# Patient Record
Sex: Male | Born: 1935 | Race: White | Hispanic: No | Marital: Married | Smoking: Former smoker
Health system: Southern US, Community
[De-identification: ages and names within clinical notes are randomized; demographics above are authoritative.]

## PROBLEM LIST (undated history)

## (undated) DIAGNOSIS — E7800 Pure hypercholesterolemia, unspecified: Secondary | ICD-10-CM

## (undated) DIAGNOSIS — Z9049 Acquired absence of other specified parts of digestive tract: Secondary | ICD-10-CM

## (undated) DIAGNOSIS — D649 Anemia, unspecified: Secondary | ICD-10-CM

## (undated) DIAGNOSIS — K5730 Diverticulosis of large intestine without perforation or abscess without bleeding: Secondary | ICD-10-CM

## (undated) DIAGNOSIS — I2510 Atherosclerotic heart disease of native coronary artery without angina pectoris: Secondary | ICD-10-CM

## (undated) DIAGNOSIS — K4090 Unilateral inguinal hernia, without obstruction or gangrene, not specified as recurrent: Secondary | ICD-10-CM

## (undated) DIAGNOSIS — K219 Gastro-esophageal reflux disease without esophagitis: Secondary | ICD-10-CM

## (undated) DIAGNOSIS — D126 Benign neoplasm of colon, unspecified: Secondary | ICD-10-CM

## (undated) DIAGNOSIS — I10 Essential (primary) hypertension: Secondary | ICD-10-CM

## (undated) DIAGNOSIS — C801 Malignant (primary) neoplasm, unspecified: Secondary | ICD-10-CM

## (undated) DIAGNOSIS — F419 Anxiety disorder, unspecified: Secondary | ICD-10-CM

## (undated) DIAGNOSIS — M810 Age-related osteoporosis without current pathological fracture: Secondary | ICD-10-CM

## (undated) DIAGNOSIS — S1121XA Laceration without foreign body of pharynx and cervical esophagus, initial encounter: Secondary | ICD-10-CM

## (undated) HISTORY — PX: CARDIAC SURGERY: SHX584

## (undated) HISTORY — DX: Acquired absence of other specified parts of digestive tract: Z90.49

## (undated) HISTORY — PX: APPENDECTOMY: SHX54

## (undated) HISTORY — DX: Laceration without foreign body of pharynx and cervical esophagus, initial encounter: S11.21XA

## (undated) HISTORY — PX: ESOPHAGUS SURGERY: SHX626

## (undated) HISTORY — DX: Age-related osteoporosis without current pathological fracture: M81.0

## (undated) HISTORY — DX: Benign neoplasm of colon, unspecified: D12.6

## (undated) HISTORY — DX: Atherosclerotic heart disease of native coronary artery without angina pectoris: I25.10

## (undated) HISTORY — DX: Anemia, unspecified: D64.9

## (undated) HISTORY — DX: Diverticulosis of large intestine without perforation or abscess without bleeding: K57.30

## (undated) HISTORY — PX: HERNIA REPAIR: SHX51

## (undated) HISTORY — DX: Anxiety disorder, unspecified: F41.9

## (undated) HISTORY — PX: CORONARY ARTERY BYPASS GRAFT: SHX141

## (undated) HISTORY — PX: SMALL INTESTINE SURGERY: SHX150

## (undated) HISTORY — PX: KYPHOSIS SURGERY: SHX114

---

## 2005-10-29 DIAGNOSIS — I2510 Atherosclerotic heart disease of native coronary artery without angina pectoris: Secondary | ICD-10-CM

## 2005-10-29 HISTORY — DX: Atherosclerotic heart disease of native coronary artery without angina pectoris: I25.10

## 2010-11-21 ENCOUNTER — Inpatient Hospital Stay (HOSPITAL_COMMUNITY)
Admission: EM | Admit: 2010-11-21 | Discharge: 2010-11-23 | Payer: Self-pay | Source: Home / Self Care | Attending: Internal Medicine | Admitting: Internal Medicine

## 2010-11-22 LAB — HEMOGLOBIN AND HEMATOCRIT, BLOOD
HCT: 38.2 % — ABNORMAL LOW (ref 39.0–52.0)
HCT: 37.7 % — ABNORMAL LOW (ref 39.0–52.0)
HCT: 36.9 % — ABNORMAL LOW (ref 39.0–52.0)
Hemoglobin: 13 g/dL (ref 13.0–17.0)
Hemoglobin: 12.7 g/dL — ABNORMAL LOW (ref 13.0–17.0)

## 2010-11-22 LAB — CBC
Hemoglobin: 13.5 g/dL (ref 13.0–17.0)
Hemoglobin: 12.6 g/dL — ABNORMAL LOW (ref 13.0–17.0)
MCH: 32.2 pg (ref 26.0–34.0)
MCHC: 35.4 g/dL (ref 30.0–36.0)
MCV: 94.6 fL (ref 78.0–100.0)
Platelets: 172 10*3/uL (ref 150–400)
Platelets: 168 10*3/uL (ref 150–400)
RBC: 3.91 MIL/uL — ABNORMAL LOW (ref 4.22–5.81)
RDW: 13.4 % (ref 11.5–15.5)
WBC: 10.1 10*3/uL (ref 4.0–10.5)

## 2010-11-22 LAB — CARDIAC PANEL(CRET KIN+CKTOT+MB+TROPI)
CK, MB: 2.4 ng/mL (ref 0.3–4.0)
Relative Index: 0.8 (ref 0.0–2.5)
Troponin I: 0.04 ng/mL (ref 0.00–0.06)

## 2010-11-22 LAB — COMPREHENSIVE METABOLIC PANEL
ALT: 15 U/L (ref 0–53)
AST: 22 U/L (ref 0–37)
Albumin: 3 g/dL — ABNORMAL LOW (ref 3.5–5.2)
CO2: 21 mEq/L (ref 19–32)
Calcium: 8.9 mg/dL (ref 8.4–10.5)
Creatinine, Ser: 1.02 mg/dL (ref 0.4–1.5)
GFR calc Af Amer: >60 mL/min (ref >60)
Sodium: 136 mEq/L (ref 135–145)
Total Protein: 8.8 g/dL — ABNORMAL HIGH (ref 6.0–8.3)

## 2010-11-22 LAB — BASIC METABOLIC PANEL
BUN: 36 mg/dL — ABNORMAL HIGH (ref 6–23)
Chloride: 104 mEq/L (ref 96–112)
GFR calc non Af Amer: >60 mL/min (ref >60)
Glucose, Bld: 96 mg/dL (ref 70–99)
Potassium: 3.5 mEq/L (ref 3.5–5.1)
Sodium: 137 mEq/L (ref 135–145)

## 2010-11-22 LAB — HEPATIC FUNCTION PANEL
ALT: 14 U/L (ref 0–53)
Bilirubin, Direct: 0.3 mg/dL (ref 0.0–0.3)
Indirect Bilirubin: 0.8 mg/dL (ref 0.3–0.9)
Total Protein: 8.1 g/dL (ref 6.0–8.3)

## 2010-11-22 LAB — BRAIN NATRIURETIC PEPTIDE: Pro B Natriuretic peptide (BNP): 67 pg/mL (ref 0.0–100.0)

## 2010-11-22 LAB — OCCULT BLOOD, POC DEVICE: Fecal Occult Bld: POSITIVE

## 2010-11-22 LAB — CK TOTAL AND CKMB (NOT AT ARMC)
CK, MB: 1.9 ng/mL (ref 0.3–4.0)
Relative Index: 1.1 (ref 0.0–2.5)

## 2010-11-22 LAB — TROPONIN I: Troponin I: 0.03 ng/mL (ref 0.00–0.06)

## 2010-11-23 LAB — HEMOGLOBIN AND HEMATOCRIT, BLOOD: Hemoglobin: 12.9 g/dL — ABNORMAL LOW (ref 13.0–17.0)

## 2010-11-23 NOTE — H&P (Addendum)
NAMEGARRETT, Andre Mueller                ACCOUNT NO.:  [id number]  MEDICAL RECORD NO.:  [id number]          PATIENT TYPE:  INP  LOCATION:  4742                         FACILITY:  [facility]  PHYSICIAN:  Vonna Brabson I Charlottie Peragine, MD      DATE OF BIRTH:  January 18, 1921  DATE OF ADMISSION:  11/21/2010 DATE OF DISCHARGE:                             HISTORY & PHYSICAL   PRIMARY CARE PHYSICIAN:  Deirdre Peer. Polite, MD  CHIEF COMPLAINT:  Vomiting associated with dark brown and coughing for 1 day.  HISTORY OF PRESENT ILLNESS:  This is an 77 year old gentleman who moved from [place] to [place] area for the last 14 months.  The patient has significant medical history of coronary artery disease, status post 2 CABG, 2 bypass, according to the patient he has a history of multiple esophageal stricture, status post dilatation and as my understanding, the patient has a history of esophageal perforation, status post surgical repair and this was 5 years ago.  When the patient had prep for colonoscopy and he could not tolerate the prep secondary to vomiting and then the patient was evaluated and found to have perforation on the esophagus and accordingly, he had a surgery through his back.  The patient was seen by Dr. Abner Greenspan on medical checkup last week and at that time, the patient has no major complaint.  The patient denies any drinking alcohol.  He quit more than 5 years ago and he denies any history of peptic ulcer disease.  He denies using any nonsteroid anti-inflammatory drugs.  He is on high-dose of aspirin 325 mg.  His symptoms started with vomiting, food and then he noticed some dark brown vomitus.  He is feeling weak at this time.  When EMS was called, EMS documented the patient has coffee-ground emesis in the waste can and on bed.  The patient was seen here at [facility] Emergency Room and have guaiac stool which was positive.  He had stable vital signs. The patient denies any chest pain, denies any  shortness of breath.  He continued to complain of nausea and according to the patient, vomiting is stopped early morning today.  The patient denies any abdominal pain or epigastric.  The patient denies any syncopal or near syncopal event.  PAST MEDICAL HISTORY: 1. Significant for history of gastroesophageal reflux disease. 2. History of esophageal stricture, status post dilatation. 3. Hypertension. 4. History of myocardial infarction. 5. Hypercholesteremia.  PAST SURGICAL HISTORY: 1. History of CABG. 2. History of esophageal perforation, status post surgery and repair. 3. History of abdominal surgery.  ALLERGIES:  PENICILLIN.  SOCIAL HISTORY:  The patient lives with his wife on independent living facility and his family is nearby.  The patient denies any smoking.  He has quit more than 4 years ago and he used to drink, but quit more than 5 years ago after diagnosis of heart attack made.  MEDICATIONS:  According to the EMS note, now the patient on: 1. Ambien. 2. Ativan. 3. Lasix. 4. Phenergan 25 mg as needed. 5. Prilosec. 6. __________ 7. Zocor. 8. Multivitamin. 9. Amlodipine and benazepril. 10.Nitroglycerin.  SYSTEMIC REVIEW:  HEENT:  The patient denies any headache.  Denies any blurring of vision.  Denies any seizure activity.  CARDIOVASCULAR:  The patient denies any chest pain.  Denies any shortness of breath.  Denies any lower extremity swelling.  Denies any palpitation.  Denies any syncopal or presyncopal event.  RESPIRATORY:  The patient complaining of coughing of clear phlegm.  ABDOMEN:  As we mentioned, complained of persistent vomiting.  Denies any abdominal pain.  Denies any dark stool. He has regular bowel movement.  UROLOGY:  Denies any burning micturition.  Denies any numbness or weakness of his extremity.  PHYSICAL EXAMINATION:  VITAL SIGNS:  Temperature 99, blood pressure 107/61, pulse rate 86, respiratory rate 20, and O2 sats 91% on room air. HEENT:   Normocephalic and atraumatic.  Pupils equal, reactive to light and accommodation.  Extraocular muscle movement within normal. NECK:  Supple.  No lymphadenopathy. HEART:  Sound S1 and S2 with no added sounds. LUNGS:  Bilateral rales.  No wheezing. ABDOMEN:  Soft and nontender.  Bowel sounds positive.  Peripheral pulses intact.  There is no lower limb edema.  No rebound.  No guarding. CNS:  The patient is awake, alert, and oriented x3 with no focal neurological finding.  LABORATORY DATA:  Blood work did show sodium 136, potassium 3.4, chloride 104, CO2 21, glucose 166, BUN 38, and creatinine 1.02, total bilirubin 1.3, alk phos 44, total protein 8.8, albumin 3, and calcium 8.9.  CBC; white blood cells 9.3, hemoglobin 13.5, hematocrit 38.1, and platelets 172.  Chest x-ray did show postmedian sternotomy prominent with marking chronic, recommend x-ray AP and lateral view.  ASSESSMENT: 1. Persistent nausea and vomiting. 2. Gastrointestinal bleeding. 3. Hypokalemia. 4. History of esophageal perforation. 5. History of esophageal stricture. 6. History of coronary artery disease.  PLAN:  The patient will be admitted to telemetry.  His vital signs seems stable currently, we will proceed with CT chest for evaluation of any evidence of perforation.  We will get lactic acid and procalcitonin level.  We will start the patient on Protonix IV 40 mg twice daily.  We will ask GI to see the patient.  The patient admitted, he has been coughing also.  White blood cell seem stable at 9.3.  We will provide the patient with Avelox 400 mg p.o. daily for possibility of aspiration.  The patient has obvious bilateral rales which could be the patient baseline, but also putting on consideration history of CABG, we will proceed with checking the patient's BNP and gentle IV hydration.  We will keep the patient n.p.o.  Further recommendation to be addressed as hospital course progress.     Jordie Schreur Bosie Helper,  MD     HIE/MEDQ  D:  11/21/2010  T:  11/22/2010  Job:  [id number]  Electronically Signed by Ebony Cargo MD on 11/23/2010 02:00:34 PM

## 2010-11-30 NOTE — Discharge Summary (Signed)
  NAMECYPRUS, Andre Mueller                ACCOUNT NO.:  [id number]  MEDICAL RECORD NO.:  [id number]          PATIENT TYPE:  INP  LOCATION:  4742                         FACILITY:  [facility]  PHYSICIAN:  Deirdre Peer. Tyrease Vandeberg, M.D. DATE OF BIRTH:  Nov 30, 1920  DATE OF ADMISSION:  11/21/2010 DATE OF DISCHARGE:  11/23/2010                              DISCHARGE SUMMARY   DISCHARGE DIAGNOSES: 1. Nausea and vomiting with reported coffee-ground emesis and heme-     positive stools, the patient underwent EGD which showed some     esophagitis, no ulcer, H and H remained stable, the patient was     tolerant of full p.o. intake without recurrence of emesis.  He was     discharged to home in stable condition.  Asked to continue PPI     b.i.d. for 2 weeks, then daily.  We will hold his aspirin until     outpatient followup. 2. Hypertension. 3. Coronary artery disease. 4. High cholesterol, 5. Gastroesophageal reflux disease.  DISCHARGE MEDICATIONS: 1. Lasix 20 mg daily. 2. Ambien 10 mg nightly. 3. Ativan 1 mg b.i.d. p.r.n. 4. Lotrel 5/40 daily. 5. Multivitamin daily. 6. Sublingual nitroglycerin p.r.n. 7. Benicar 25 mg p.r.n. 8. Ziac daily. 9. Zocor 10 mg daily. 10.The patient's Prilosec will be 40 mg b.i.d.  DISPOSITION:  Discharged to home in stable condition, please note the patient was seen by PT, Home Health Services will be provided. Hemoglobin at discharge 12.9.  HISTORY OF PRESENT ILLNESS:  Andre Mueller presented to the ED with complaints of nausea and vomiting.  In the ED the patient was evaluated. Admission was deemed necessary for full evaluation and treatment. Please see dictated H and P for further details.  HOSPITAL COURSE:  The patient was admitted to the medicine floor bed for evaluation and treatment of nausea and vomiting.  The patient's chest x- ray suggested right lower lobe mass and mediastinal adenopathy and recommended CT.  CT was obtained which revealed mild-to-moderate  diffuse low density esophageal wall thickening compatible with esophagitis, no other mass was identified.  There was air in the biliary tree.  There also was right diaphragmatic pleural calcifications possibly due to previous asbestosis exposure.  The patient was hospitalized, was made n.p.o., GI was consulted.  The patient underwent EGD with findings as discussed above, only revealing esophagitis, no ulcer.  The patient was resumed on PPI b.i.d. and diet was advanced which he tolerated well.  The patient was somewhat fatigued.  Physical therapy was obtained.  Home health services were provided.  The patient was discharged to home in stable condition and asked to follow with primary MD in 1-2 weeks.     Deirdre Peer. Lakie Mclouth, M.D.     RDP/MEDQ  D:  11/23/2010  T:  11/24/2010  Job:  [id number]  Electronically Signed by Windy Fast Aleda Madl M.D. on 11/30/2010 10:03:09 AM

## 2010-12-27 NOTE — Consult Note (Signed)
[name], [name]                ACCOUNT NO.:  [id number]  MEDICAL RECORD NO.:  [id number]          PATIENT TYPE:  INP  LOCATION:  4742                         FACILITY:  [facility]  PHYSICIAN:  Willis Modena, MD     DATE OF BIRTH:  June 30, 1921  DATE OF CONSULTATION:  11/21/2010 DATE OF DISCHARGE:                                CONSULTATION   REASON FOR CONSULTATION:  Coffee-ground emesis.  CHIEF COMPLAINT:  Nausea, vomiting.  HISTORY OF PRESENT ILLNESS:  [name] is an 77 year old gentleman with history of coronary artery disease as well as history of GERD with spontaneous esophageal rupture about 20 years ago.  He was in a static state of health until in the wee hours of this morning when he started having nausea and vomiting.  The vomiting was initially bile colored but eventually obtained a dark brown coffee-ground texture.  He has a history, as mentioned, of esophageal perforation with ___________ repair many years ago.  He has had some postoperative dysphagia.  This required periodic endoscopic dilatations.  He has not had an endoscopy by his report in at least 5 or 6 years.  He has chronic acid reflux as mentioned and does take acid suppressing therapy.  He has a history of GI bleeding in the past required removal of part of his small intestines about 20 years ago.  No other known GI history.  PAST MEDICAL HISTORY:  Hypertension, hyperlipidemia, coronary artery disease with MI, glaucoma, cataracts, possible history of Waldenstrom macroglobulinemia, skin cancer, benign prostatic hypertrophy, allergic rhinitis, insomnia.  ALLERGIES:  PENICILLIN.  MEDICATIONS: 1. Aspirin. 2. Phenergan. 3. Nitroglycerin as needed. 4. Lasix. 5. Omeprazole 20 mg a day. 6. Flonase. 7. Lotrel. 8. Simvastatin. 9. Bisoprolol/hydrochlorothiazide. 10.Ativan. 11.Ambien.  SOCIAL HISTORY:  Lives with his wife at [facility].  Denies smoking, alcohol or illicit  drug use.  REVIEW OF SYSTEMS:  As per history of present illness.  All other systems were negative.  PHYSICAL EXAMINATION:  VITAL SIGNS:  Systolic blood pressure is by 161, heart rates about 80, is afebrile. GENERAL:  He is nontoxic appearing. HEENT:  Normocephalic, atraumatic.  Dry mucous membranes.  Eyes; sclerae anicteric.  Conjunctivae are pink.  NECK:  Supple without thyromegaly, lymphadenopathy or bruits. HEART:  Regular rhythm, normal rate without murmur, rub or gallop. LUNGS:  Clear to auscultation. ABDOMEN:  Multiple old surgical scars.  Soft, nontender, nondistended with normoactive bowel sounds.  EXTREMITIES:  No peripheral cyanosis, clubbing or edema. SKIN:  Skin has some occasional ecchymoses.  No obvious rash. LYMPHATICS:  No palpable axillary, submandibular, supraclavicular adenopathy. NEUROLOGIC:  Diffusely weak, nonfocal without lateralizing signs.  He does appear to have some short and long-term memory problems but is redirectable.  LABORATORY DATA:  White count 9.3, hemoglobin 13.5, platelet count is 172.  Sodium 136, potassium 3.4, chloride 101, bicarb 21, BUN 38, creatinine 1.0, total protein 8.8, albumin 3.0, total bilirubin is 1.3, alk phos 44, AST is 22, ALT 15.  Cardiac enzymes are negative, had a CT scan of the chest today that shows some distal esophageal thickening with esophagitis, some pneumobilia, but  no evidence of esophageal perforation.  IMPRESSION AND PLAN:  [name] is an 77 year old gentleman who we have been asked to see for coffee-ground emesis.  I suspect he has esophagitis.  Less likely consideration would be a Mallory-Weiss tear in light of his repeated bilious episodes of vomiting followed by some coffee grounds.  He is not actively bleeding and is hemodynamically stable and is not toxic-appearing. 1. I agree with supportive management with IV fluids and proton pump     inhibitor therapy. 2. We will proceed with endoscopy tomorrow for  further evaluation.     Willis Modena, MD     WO/MEDQ  D:  11/21/2010  T:  11/22/2010  Job:  [id number]  Electronically Signed by Willis Modena  on 12/27/2010 05:39:38 PM

## 2012-02-14 ENCOUNTER — Emergency Department (HOSPITAL_COMMUNITY): Payer: Medicare Other

## 2012-02-14 ENCOUNTER — Encounter (HOSPITAL_COMMUNITY): Payer: Self-pay | Admitting: *Deleted

## 2012-02-14 ENCOUNTER — Emergency Department (HOSPITAL_COMMUNITY)
Admission: EM | Admit: 2012-02-14 | Discharge: 2012-02-14 | Disposition: A | Discharge status: Home / Self Care | Payer: Medicare Other | Attending: Emergency Medicine | Admitting: Emergency Medicine

## 2012-02-14 DIAGNOSIS — I10 Essential (primary) hypertension: Secondary | ICD-10-CM | POA: Insufficient documentation

## 2012-02-14 DIAGNOSIS — R109 Unspecified abdominal pain: Secondary | ICD-10-CM | POA: Insufficient documentation

## 2012-02-14 DIAGNOSIS — K5900 Constipation, unspecified: Secondary | ICD-10-CM | POA: Insufficient documentation

## 2012-02-14 DIAGNOSIS — I252 Old myocardial infarction: Secondary | ICD-10-CM | POA: Insufficient documentation

## 2012-02-14 HISTORY — DX: Essential (primary) hypertension: I10

## 2012-02-14 HISTORY — DX: Gastro-esophageal reflux disease without esophagitis: K21.9

## 2012-02-14 LAB — DIFFERENTIAL
Basophils Absolute: 0 10*3/uL (ref 0.0–0.1)
Lymphocytes Relative: 19 % (ref 12–46)
Lymphs Abs: 2 10*3/uL (ref 0.7–4.0)
Neutro Abs: 7.4 10*3/uL (ref 1.7–7.7)
Neutrophils Relative %: 69 % (ref 43–77)

## 2012-02-14 LAB — URINALYSIS, ROUTINE W REFLEX MICROSCOPIC
Bilirubin Urine: NEGATIVE
Ketones, ur: 15 mg/dL — AB
Specific Gravity, Urine: 1.011 (ref 1.005–1.030)
Urobilinogen, UA: 0.2 mg/dL (ref 0.0–1.0)

## 2012-02-14 LAB — COMPREHENSIVE METABOLIC PANEL
ALT: 11 U/L (ref 0–53)
AST: 20 U/L (ref 0–37)
Alkaline Phosphatase: 66 U/L (ref 39–117)
CO2: 23 mEq/L (ref 19–32)
Calcium: 9.7 mg/dL (ref 8.4–10.5)
Chloride: 99 mEq/L (ref 96–112)
GFR calc Af Amer: >90 mL/min (ref >90)
GFR calc non Af Amer: 79 mL/min — ABNORMAL LOW (ref >90)
Glucose, Bld: 100 mg/dL — ABNORMAL HIGH (ref 70–99)
Potassium: 3.9 mEq/L (ref 3.5–5.1)
Sodium: 136 mEq/L (ref 135–145)
Total Bilirubin: 1 mg/dL (ref 0.3–1.2)

## 2012-02-14 LAB — CBC
MCV: 95.4 fL (ref 78.0–100.0)
Platelets: 179 10*3/uL (ref 150–400)
RBC: 4.15 MIL/uL — ABNORMAL LOW (ref 4.22–5.81)
RDW: 13.9 % (ref 11.5–15.5)
WBC: 10.7 10*3/uL — ABNORMAL HIGH (ref 4.0–10.5)

## 2012-02-14 LAB — URINE MICROSCOPIC-ADD ON

## 2012-02-14 MED ORDER — MAGNESIUM CITRATE PO SOLN
1.0000 | Freq: Once | ORAL | Status: AC
  Administered 2012-02-14: 1 via ORAL
  Filled 2012-02-14: qty 296

## 2012-02-14 MED ORDER — POLYETHYLENE GLYCOL 3350 17 GM/SCOOP PO POWD: 17.0000 g | Freq: Every day | ORAL | Status: AC

## 2012-02-14 MED ORDER — MINERAL OIL RE ENEM
1.0000 | ENEMA | RECTAL | Status: AC
  Administered 2012-02-14: 1 via RECTAL
  Filled 2012-02-14: qty 1

## 2012-02-14 MED ORDER — BISACODYL 10 MG RE SUPP
10.0000 mg | Freq: Once | RECTAL | Status: AC
  Administered 2012-02-14: 10 mg via RECTAL
  Filled 2012-02-14: qty 1

## 2012-02-14 NOTE — ED Notes (Signed)
Pt in from [place] by ems. Pt reports abd pain x2 days. Radiating to bil flanks. Constipation x2 days. Denies n/v.

## 2012-02-14 NOTE — ED Provider Notes (Addendum)
History     CSN: [id number]  Arrival date & time 02/14/12  1306   First MD Initiated Contact with Patient 02/14/12 1457      Chief Complaint  Patient presents with  . Abdominal Pain  . Flank Pain    (Consider location/radiation/quality/duration/timing/severity/associated sxs/prior treatment) HPI Comments: Patient presents with complaints of constipation and back pain.  He states that he has not had a normal bowel movement in 2-3 days.  He notes decrease flatus as well.  No nausea or vomiting or fevers.  Patient notes that his abdomen feels uncomfortable but does not specifically have pain.  Patient also notes 2-3 days of bilateral flank pain.  No dysuria or hematuria.  No history kidney stones.  No lightheadedness or dizziness.  No chest pain or shortness of breath.  For the patient's constipation he had tried over-the-counter laxative and a fleets enema without relief of symptoms.  Patient presents because the back of the fleets package mentioned that if he did not have results you should seek further medical care.  Patient is a 78 y.o. male presenting with abdominal pain and flank pain. The history is provided by the patient. No language interpreter was used.  Abdominal Pain The primary symptoms of the illness include abdominal pain. The primary symptoms of the illness do not include fever, fatigue, shortness of breath, nausea, vomiting, diarrhea, hematemesis, hematochezia or dysuria. The current episode started more than 2 days ago. The onset of the illness was gradual. The problem has been gradually worsening.  Additional symptoms associated with the illness include constipation. Symptoms associated with the illness do not include chills, hematuria or back pain.  Flank Pain Associated symptoms include abdominal pain. Pertinent negatives include no chest pain, no headaches and no shortness of breath.    Past Medical History  Diagnosis Date  . MI (myocardial infarction)   .  Hypertension   . GERD (gastroesophageal reflux disease)   . Hernia     Past Surgical History  Procedure Date  . Cardiac surgery   . Abdominal surgery   . Appendectomy     No family history on file.  History  Substance Use Topics  . Smoking status: Never Smoker   . Smokeless tobacco: Not on file  . Alcohol Use: No      Review of Systems  Constitutional: Negative.  Negative for fever, chills and fatigue.  HENT: Negative.   Eyes: Negative.  Negative for discharge and redness.  Respiratory: Negative.  Negative for cough and shortness of breath.   Cardiovascular: Negative.  Negative for chest pain.  Gastrointestinal: Positive for abdominal pain and constipation. Negative for nausea, vomiting, diarrhea, hematochezia and hematemesis.  Genitourinary: Positive for flank pain. Negative for dysuria and hematuria.  Musculoskeletal: Negative.  Negative for back pain.  Skin: Negative.  Negative for color change and rash.  Neurological: Negative for syncope and headaches.  Hematological: Negative.  Negative for adenopathy.  Psychiatric/Behavioral: Negative.  Negative for confusion.  All other systems reviewed and are negative.    Allergies  Penicillins  Home Medications   Current Outpatient Rx  Name Route Sig Dispense Refill  . LOTREL PO Oral Take 1 capsule by mouth daily. 40 mg    . AMMONIUM LACTATE 12 % EX CREA Topical Apply 1 Bottle topically as needed. Apply to cheek    . ASPIRIN EC 81 MG PO TBEC Oral Take 81 mg by mouth daily.    Marland Kitchen BISOPROLOL FUMARATE PO Oral Take 1 tablet by mouth daily.  2.5    . LORAZEPAM 0.5 MG PO TABS Oral Take 0.5 mg by mouth at bedtime.    . ADULT MULTIVITAMIN W/MINERALS CH Oral Take 1 tablet by mouth daily.    Marland Kitchen OMEPRAZOLE 20 MG PO CPDR Oral Take 20 mg by mouth daily.    Marland Kitchen PROMETHAZINE HCL 25 MG PO TABS Oral Take 25 mg by mouth every 6 (six) hours as needed.    Marland Kitchen PROPYLENE GLYCOL 0.6 % OP SOLN Ophthalmic Apply to eye.    Marland Kitchen SIMVASTATIN 10 MG PO  TABS Oral Take 10 mg by mouth at bedtime.    Marland Kitchen ZOLPIDEM TARTRATE 10 MG PO TABS Oral Take 10 mg by mouth at bedtime as needed. For sleep      BP 151/69  Pulse 70  Temp(Src) 98.2 F (36.8 C) (Oral)  SpO2 97%  Physical Exam  Nursing note and vitals reviewed. Constitutional: He is oriented to person, place, and time. He appears well-developed and well-nourished.  Non-toxic appearance. He does not have a sickly appearance.  HENT:  Head: Normocephalic and atraumatic.  Eyes: Conjunctivae, EOM and lids are normal. Pupils are equal, round, and reactive to light.  Neck: Trachea normal, normal range of motion and full passive range of motion without pain. Neck supple.  Cardiovascular: Normal rate, regular rhythm and normal heart sounds.   Pulmonary/Chest: Effort normal and breath sounds normal. No respiratory distress. He has no wheezes. He has no rales.  Abdominal: Soft. Normal appearance. He exhibits no distension. There is no tenderness. There is no rebound and no CVA tenderness.  Genitourinary: Rectum normal.       Small amount of brown stool present in vault, no impaction able to be felt  Musculoskeletal: Normal range of motion.  Neurological: He is alert and oriented to person, place, and time. He has normal strength.  Skin: Skin is warm, dry and intact. No rash noted.  Psychiatric: He has a normal mood and affect. His behavior is normal. Judgment and thought content normal.    ED Course  Procedures (including critical care time)  Results for orders placed during the hospital encounter of 02/14/12  CBC      Component Value Range   WBC 10.7 (*) 4.0 - 10.5 (K/uL)   RBC 4.15 (*) 4.22 - 5.81 (MIL/uL)   Hemoglobin 13.9  13.0 - 17.0 (g/dL)   HCT 40.9  81.1 - 91.4 (%)   MCV 95.4  78.0 - 100.0 (fL)   MCH 33.5  26.0 - 34.0 (pg)   MCHC 35.1  30.0 - 36.0 (g/dL)   RDW 78.2  95.6 - 21.3 (%)   Platelets 179  150 - 400 (K/uL)  DIFFERENTIAL      Component Value Range   Neutrophils Relative 69   43 - 77 (%)   Neutro Abs 7.4  1.7 - 7.7 (K/uL)   Lymphocytes Relative 19  12 - 46 (%)   Lymphs Abs 2.0  0.7 - 4.0 (K/uL)   Monocytes Relative 11  3 - 12 (%)   Monocytes Absolute 1.2 (*) 0.1 - 1.0 (K/uL)   Eosinophils Relative 1  0 - 5 (%)   Eosinophils Absolute 0.1  0.0 - 0.7 (K/uL)   Basophils Relative 0  0 - 1 (%)   Basophils Absolute 0.0  0.0 - 0.1 (K/uL)  COMPREHENSIVE METABOLIC PANEL      Component Value Range   Sodium 136  135 - 145 (mEq/L)   Potassium 3.9  3.5 - 5.1 (mEq/L)  Chloride 99  96 - 112 (mEq/L)   CO2 23  19 - 32 (mEq/L)   Glucose, Bld 100 (*) 70 - 99 (mg/dL)   BUN 15  6 - 23 (mg/dL)   Creatinine, Ser 1.61  0.50 - 1.35 (mg/dL)   Calcium 9.7  8.4 - 09.6 (mg/dL)   Total Protein 9.9 (*) 6.0 - 8.3 (g/dL)   Albumin 3.8  3.5 - 5.2 (g/dL)   AST 20  0 - 37 (U/L)   ALT 11  0 - 53 (U/L)   Alkaline Phosphatase 66  39 - 117 (U/L)   Total Bilirubin 1.0  0.3 - 1.2 (mg/dL)   GFR calc non Af Amer 79 (*) >90 (mL/min)   GFR calc Af Amer >90  >90 (mL/min)  URINALYSIS, ROUTINE W REFLEX MICROSCOPIC      Component Value Range   Color, Urine YELLOW  YELLOW    APPearance CLOUDY (*) CLEAR    Specific Gravity, Urine 1.011  1.005 - 1.030    pH 6.0  5.0 - 8.0    Glucose, UA NEGATIVE  NEGATIVE (mg/dL)   Hgb urine dipstick SMALL (*) NEGATIVE    Bilirubin Urine NEGATIVE  NEGATIVE    Ketones, ur 15 (*) NEGATIVE (mg/dL)   Protein, ur NEGATIVE  NEGATIVE (mg/dL)   Urobilinogen, UA 0.2  0.0 - 1.0 (mg/dL)   Nitrite NEGATIVE  NEGATIVE    Leukocytes, UA NEGATIVE  NEGATIVE   OCCULT BLOOD, POC DEVICE      Component Value Range   Fecal Occult Bld NEGATIVE    URINE MICROSCOPIC-ADD ON      Component Value Range   Squamous Epithelial / LPF RARE  RARE    RBC / HPF 0-2  <3 (RBC/hpf)   Dg Abd Acute W/chest  02/14/2012  *RADIOLOGY REPORT*  Clinical Data: Diffuse abdominal pain.  ACUTE ABDOMEN SERIES (ABDOMEN 2 VIEW & CHEST 1 VIEW)  Comparison: 11/21/2010.  Findings: Frontal view of the chest  shows midline trachea and stable heart size.  Thoracic aorta is calcified.  Mild scarring at the lung bases.  No pleural fluid.  Two views of the abdomen show gas and stool in mildly distended colon.  No definite small bowel dilatation.  IMPRESSION: Question mild colonic ileus with constipation.  Original Report Authenticated By: Reyes Ivan, M.D.     MDM  Patient with history and exam that appears to be consistent with constipation.  He does not have nausea or vomiting or significant abdominal pain to make me more concerned for small bowel obstruction at this time.  He has no signs of urinary tract infection.  His back pain is actually a low back pain that is worse with certain movements it appears to be more musculoskeletal in origin as it is tender to palpation in his low back.  He did have a fall a few days ago before this pain began as well.  He has no pain over his spine.  We have attempted to use a mineral oil enema to relieve the patient's constipation without success.  We'll attempt using magnesium citrate for the patient at this time.        Nat Christen, MD 02/14/12 1740  Patient has drink the magnesium citrate and has not had a bowel movement yet.  Patient would like to go home at this time and continue to followup as an outpatient as needed.  As I believe the patient just has constipation I believe this is appropriate this time will  give him precautions to return for worsening abdominal pain or fevers, or continued constipation over the next few days.  Nat Christen, MD 02/14/12 1950  Patient and spouse have had further discussion and he is going to stay for continued treatment while the wife goes home at this time.  Nat Christen, MD 02/14/12 2008  After bisocodyl patient had significant bowel movements and is now ready for discharge home and feels much improved.  Nat Christen, MD 02/14/12 2207

## 2012-02-14 NOTE — Discharge Instructions (Signed)
Please return for worsening abdominal pain, fevers or lack of bowel movements over the next few days.  Please followup with your primary care physician on Monday.  Constipation in Adults Constipation is having fewer than 2 bowel movements per week. Usually, the stools are hard. As we grow older, constipation is more common. If you try to fix constipation with laxatives, the problem may get worse. This is because laxatives taken over a long period of time make the colon muscles weaker. A low-fiber diet, not taking in enough fluids, and taking some medicines may make these problems worse. MEDICATIONS THAT MAY CAUSE CONSTIPATION  Water pills (diuretics).   Calcium channel blockers (used to control blood pressure and for the heart).   Certain pain medicines (narcotics).   Anticholinergics.   Anti-inflammatory agents.   Antacids that contain aluminum.  DISEASES THAT CONTRIBUTE TO CONSTIPATION  Diabetes.   Parkinson's disease.   Dementia.   Stroke.   Depression.   Illnesses that cause problems with salt and water metabolism.  HOME CARE INSTRUCTIONS   Constipation is usually best cared for without medicines. Increasing dietary fiber and eating more fruits and vegetables is the best way to manage constipation.   Slowly increase fiber intake to 25 to 38 grams per day. Whole grains, fruits, vegetables, and legumes are good sources of fiber. A dietitian can further help you incorporate high-fiber foods into your diet.   Drink enough water and fluids to keep your urine clear or pale yellow.   A fiber supplement may be added to your diet if you cannot get enough fiber from foods.   Increasing your activities also helps improve regularity.   Suppositories, as suggested by your caregiver, will also help. If you are using antacids, such as aluminum or calcium containing products, it will be helpful to switch to products containing magnesium if your caregiver says it is okay.   If you have  been given a liquid injection (enema) today, this is only a temporary measure. It should not be relied on for treatment of longstanding (chronic) constipation.   Stronger measures, such as magnesium sulfate, should be avoided if possible. This may cause uncontrollable diarrhea. Using magnesium sulfate may not allow you time to make it to the bathroom.  SEEK IMMEDIATE MEDICAL CARE IF:   There is bright red blood in the stool.   The constipation stays for more than 4 days.   There is belly (abdominal) or rectal pain.   You do not seem to be getting better.   You have any questions or concerns.  MAKE SURE YOU:   Understand these instructions.   Will watch your condition.   Will get help right away if you are not doing well or get worse.  Document Released: 07/13/2004 Document Revised: 10/04/2011 Document Reviewed: 09/18/2011 [facility] Patient Information 2012 [place], [place].

## 2012-02-14 NOTE — ED Notes (Signed)
MD at bedside. Dr. Hosmer at bedside.  

## 2012-02-14 NOTE — ED Notes (Addendum)
Pt. Reports being constipated x2 days. States he took 2 fleet enemas with no relief. Wife also states pt. Fell 4 days ago and was able to get right up. Minor skin tears on arms.

## 2012-02-14 NOTE — ED Notes (Signed)
Pt. Has not had relief with enema. States he needs something else.

## 2012-02-14 NOTE — ED Notes (Signed)
[id number]<QI> Expected date:<BR> Expected time: 1:00 PM<BR> Means of arrival:Ambulance<BR> Comments:<BR> M30 -- Constipation

## 2012-02-18 ENCOUNTER — Emergency Department (HOSPITAL_COMMUNITY)
Admission: EM | Admit: 2012-02-18 | Discharge: 2012-02-18 | Disposition: A | Discharge status: Nursing Home (Includes ICF) | Payer: Medicare Other | Attending: Emergency Medicine | Admitting: Emergency Medicine

## 2012-02-18 ENCOUNTER — Emergency Department (HOSPITAL_COMMUNITY): Payer: Medicare Other

## 2012-02-18 DIAGNOSIS — K219 Gastro-esophageal reflux disease without esophagitis: Secondary | ICD-10-CM | POA: Insufficient documentation

## 2012-02-18 DIAGNOSIS — Z79899 Other long term (current) drug therapy: Secondary | ICD-10-CM | POA: Insufficient documentation

## 2012-02-18 DIAGNOSIS — K5900 Constipation, unspecified: Secondary | ICD-10-CM | POA: Insufficient documentation

## 2012-02-18 DIAGNOSIS — K560 Paralytic ileus: Secondary | ICD-10-CM | POA: Insufficient documentation

## 2012-02-18 DIAGNOSIS — I252 Old myocardial infarction: Secondary | ICD-10-CM | POA: Insufficient documentation

## 2012-02-18 DIAGNOSIS — I10 Essential (primary) hypertension: Secondary | ICD-10-CM | POA: Insufficient documentation

## 2012-02-18 LAB — CBC
HCT: 38.4 % — ABNORMAL LOW (ref 39.0–52.0)
Hemoglobin: 13.5 g/dL (ref 13.0–17.0)
MCH: 33.3 pg (ref 26.0–34.0)
RBC: 4.05 MIL/uL — ABNORMAL LOW (ref 4.22–5.81)

## 2012-02-18 LAB — BASIC METABOLIC PANEL
BUN: 21 mg/dL (ref 6–23)
Chloride: 97 mEq/L (ref 96–112)
Glucose, Bld: 126 mg/dL — ABNORMAL HIGH (ref 70–99)
Potassium: 3.5 mEq/L (ref 3.5–5.1)

## 2012-02-18 LAB — DIFFERENTIAL
Lymphs Abs: 1.1 10*3/uL (ref 0.7–4.0)
Monocytes Absolute: 1 10*3/uL (ref 0.1–1.0)
Monocytes Relative: 13 % — ABNORMAL HIGH (ref 3–12)
Neutro Abs: 5.1 10*3/uL (ref 1.7–7.7)
Neutrophils Relative %: 71 % (ref 43–77)

## 2012-02-18 MED ORDER — BISACODYL 10 MG RE SUPP
RECTAL | Status: AC
  Filled 2012-02-18: qty 2

## 2012-02-18 MED ORDER — IOHEXOL 300 MG/ML  SOLN
100.0000 mL | Freq: Once | INTRAMUSCULAR | Status: AC | PRN
  Administered 2012-02-18: 100 mL via INTRAVENOUS

## 2012-02-18 MED ORDER — MAGNESIUM CITRATE PO SOLN
1.0000 | Freq: Once | ORAL | Status: DC
  Filled 2012-02-18: qty 296

## 2012-02-18 MED ORDER — BISACODYL 10 MG RE SUPP
20.0000 mg | Freq: Once | RECTAL | Status: AC
  Administered 2012-02-18: 20 mg via RECTAL

## 2012-02-18 MED ORDER — TRAMADOL HCL 50 MG PO TABS: 50.0000 mg | ORAL_TABLET | Freq: Four times a day (QID) | ORAL | Status: DC | PRN

## 2012-02-18 NOTE — Discharge Instructions (Signed)

## 2012-02-18 NOTE — ED Provider Notes (Signed)
History     CSN: [id number]  Arrival date & time 02/18/12  0408   First MD Initiated Contact with Patient 02/18/12 [phone]      Chief Complaint  Patient presents with  . Back Pain    No BM since the 18th    (Consider location/radiation/quality/duration/timing/severity/associated sxs/prior treatment) HPI Comments: The patient is a noncompliant with his MiraLAX. He's only taken 2 doses since discharge from the emergency Department 4 days ago  Patient is a 78 y.o. male presenting with back pain. The history is provided by the patient. No language interpreter was used.  Back Pain  This is a recurrent problem. Episode onset: 4 days ago. The problem occurs constantly. The problem has been gradually worsening. The pain is associated with no known injury. Pain location: present in low back due to constipation - no BM in 4 days. The pain is mild. Associated symptoms include abdominal pain. Pertinent negatives include no chest pain, no fever, no numbness, no headaches, no dysuria and no weakness.    Past Medical History  Diagnosis Date  . MI (myocardial infarction)   . Hypertension   . GERD (gastroesophageal reflux disease)   . Hernia     Past Surgical History  Procedure Date  . Cardiac surgery   . Abdominal surgery   . Appendectomy     No family history on file.  History  Substance Use Topics  . Smoking status: Never Smoker   . Smokeless tobacco: Not on file  . Alcohol Use: No      Review of Systems  Constitutional: Negative for fever, chills, activity change, appetite change and fatigue.  HENT: Negative for congestion, sore throat, rhinorrhea, neck pain and neck stiffness.   Respiratory: Negative for cough and shortness of breath.   Cardiovascular: Negative for chest pain and palpitations.  Gastrointestinal: Positive for abdominal pain and constipation. Negative for nausea and vomiting.  Genitourinary: Negative for dysuria, urgency, frequency and flank pain.    Musculoskeletal: Negative for myalgias, back pain and arthralgias.  Neurological: Negative for dizziness, weakness, light-headedness, numbness and headaches.  All other systems reviewed and are negative.    Allergies  Penicillins  Home Medications   Current Outpatient Rx  Name Route Sig Dispense Refill  . AMLODIPINE BESY-BENAZEPRIL HCL 5-40 MG PO CAPS Oral Take 1 capsule by mouth daily.    . AMMONIUM LACTATE 12 % EX CREA Topical Apply 1 Bottle topically as needed. Apply to cheek    . ASPIRIN EC 81 MG PO TBEC Oral Take 81 mg by mouth daily.    Marland Kitchen BISOPROLOL-HYDROCHLOROTHIAZIDE 5-6.25 MG PO TABS Oral Take 1 tablet by mouth daily.    Marland Kitchen LORAZEPAM 0.5 MG PO TABS Oral Take 0.5 mg by mouth at bedtime.    . ADULT MULTIVITAMIN W/MINERALS CH Oral Take 1 tablet by mouth daily.    Marland Kitchen OMEPRAZOLE 20 MG PO CPDR Oral Take 20 mg by mouth daily.    Marland Kitchen PROMETHAZINE HCL 25 MG PO TABS Oral Take 25 mg by mouth every 6 (six) hours as needed.    Marland Kitchen PROPYLENE GLYCOL 0.6 % OP SOLN Ophthalmic Apply to eye.    Marland Kitchen SIMVASTATIN 10 MG PO TABS Oral Take 10 mg by mouth at bedtime.    Marland Kitchen ZOLPIDEM TARTRATE 10 MG PO TABS Oral Take 10 mg by mouth at bedtime as needed. For sleep    . POLYETHYLENE GLYCOL 3350 PO POWD Oral Take 17 g by mouth daily. 255 g 0    BP  156/79  Pulse 87  Temp(Src) 97.3 F (36.3 C) (Oral)  Resp 18  Ht 5\' 10"  (1.778 m)  Wt 180 lb (81.647 kg)  BMI 25.83 kg/m2  SpO2 95%  Physical Exam  Nursing note and vitals reviewed. Constitutional: He is oriented to person, place, and time. He appears well-developed and well-nourished. No distress.  HENT:  Head: Normocephalic and atraumatic.  Mouth/Throat: Oropharynx is clear and moist.  Eyes: Conjunctivae and EOM are normal. Pupils are equal, round, and reactive to light.  Neck: Normal range of motion. Neck supple.  Cardiovascular: Normal rate, normal heart sounds and intact distal pulses.   Pulmonary/Chest: Effort normal and breath sounds normal. No  respiratory distress. He exhibits no tenderness.  Abdominal: Soft. Bowel sounds are normal. There is tenderness (mild diffuse). There is no rebound and no guarding.  Musculoskeletal: Normal range of motion. He exhibits no edema and no tenderness.  Neurological: He is alert and oriented to person, place, and time. No cranial nerve deficit.  Skin: Skin is warm and dry. No rash noted.    ED Course  Procedures (including critical care time)  Labs Reviewed  CBC - Abnormal; Notable for the following:    RBC 4.05 (*)    HCT 38.4 (*)    All other components within normal limits  DIFFERENTIAL - Abnormal; Notable for the following:    Monocytes Relative 13 (*)    All other components within normal limits  BASIC METABOLIC PANEL - Abnormal; Notable for the following:    Sodium 133 (*)    Glucose, Bld 126 (*)    GFR calc non Af Amer 76 (*)    GFR calc Af Amer 88 (*)    All other components within normal limits   Dg Abd Acute W/chest  02/18/2012  *RADIOLOGY REPORT*  Clinical Data: Constipation and upper abdominal pain.  ACUTE ABDOMEN SERIES (ABDOMEN 2 VIEW & CHEST 1 VIEW)  Comparison: 02/14/2012  Findings: Stable postoperative changes in the mediastinum.  Cardiac enlargement with normal pulmonary vascularity.  Probable atelectasis in the lung bases.  No pneumothorax.  No blunting of costophrenic angles.  Calcification of the aorta.  Mild gaseous distension of the colon with air-fluid levels consistent with liquid stool.  Changes are most consistent with ileus. Low colonic obstruction is not excluded.  Stable appearance since previous study.  No free intra-abdominal air.  Degenerative changes in the lumbar spine and hips.  IMPRESSION: Mild gaseous distension of the colon with liquid stool most consistent with ileus.  Stable appearance since previous study. Atelectasis in the lung bases.  Original Report Authenticated By: Marlon Pel, M.D.     1. Constipation   2. Ileus       MDM    Constipation likely secondary to an ileus and some non-compliance with home meds.  Labs unremarkable.  mag citrate and dulcolax suppository. Will perform CT to further deliniate ileus process.  Has been passing flatus.  Can be dc home with aggressive bowel regimen if negative CT and having bowel movement otherwise warrants admission for bowel regimen.  Signed out to dr Donnald Garre, MD 02/18/12 [phone]

## 2012-02-18 NOTE — ED Notes (Signed)
[id number]<IO> Expected date:<BR> Expected time:<BR> Means of arrival:<BR> Comments:<BR> EMS constipation

## 2012-02-18 NOTE — ED Provider Notes (Signed)
Pt signed out to me to follow up on the CT scan.  Pt still has been having discomfort.  No vomiting or diarrhea.    CT scan does not show acute pathology.  Pt in no distress.  Hernias noted but does not appear that those are related to his symptoms.  No obstuction or strangulation.Discussed findings with patient and his wife and son.  Will dc home on pain medications.  No evidence of acute emergency medical condition.   Ct Abdomen Pelvis W Contrast  02/18/2012  *RADIOLOGY REPORT*  Clinical Data: Constipation with upper abdominal pain.  CT ABDOMEN AND PELVIS WITH CONTRAST  Technique:  Multidetector CT imaging of the abdomen and pelvis was performed following the standard protocol during bolus administration of intravenous contrast.  Contrast: OMNIPAQUE IOHEXOL 300 MG/ML  SOLN  Comparison: CT chest 11/21/2010.  Findings: Lung bases show no acute findings.  There is a calcified pleural plaque along the right hemidiaphragm.  Tiny nodule in the posterior right lower lobe (image 25) is unchanged from 11/21/2010 and is therefore considered benign.  Heart is at the upper limits of normal in size.  No pericardial or pleural effusion. Small hiatal hernia with associated surgical clips.  Scattered tiny low density lesions in the liver are sub centimeter in size, as before.  Mild intrahepatic biliary duct dilatation, as before.  Pneumobilia has resolved.  Gallbladder is poorly visualized and may be surgically absent.  Adrenal glands are unremarkable.  A 6 mm low attenuation lesion in the interpolar right kidney is too small to definitively characterize but statistically is likely a cyst.  Left kidney, spleen, pancreas, stomach and proximal small bowel are unremarkable.  Unobstructed small bowel is seen within a right inguinal hernia.  Colon is unremarkable.  Small ventral hernia contains fat and vessels.  Small left inguinal hernia contains fat.  Atherosclerotic calcification of the arterial vasculature without  abdominal aortic aneurysm.  No pathologically enlarged lymph nodes.  No free fluid.  Prostate is mildly enlarged. No worrisome lytic or sclerotic lesions.  IMPRESSION:  1.  Right inguinal hernia contains unobstructed small bowel. 2.  Small ventral and left inguinal hernias contain fat. 3.  No acute findings.  Original Report Authenticated By: Reyes Ivan, M.D.    Celene Kras, MD 02/18/12 1025

## 2012-02-19 ENCOUNTER — Observation Stay (HOSPITAL_COMMUNITY)
Admission: AD | Admit: 2012-02-19 | Discharge: 2012-02-21 | Disposition: A | Discharge status: Nursing Home (Includes ICF) | Payer: Medicare Other | Source: Ambulatory Visit | Attending: Internal Medicine | Admitting: Internal Medicine

## 2012-02-19 ENCOUNTER — Encounter (HOSPITAL_COMMUNITY): Payer: Self-pay | Admitting: *Deleted

## 2012-02-19 DIAGNOSIS — K4090 Unilateral inguinal hernia, without obstruction or gangrene, not specified as recurrent: Secondary | ICD-10-CM | POA: Insufficient documentation

## 2012-02-19 DIAGNOSIS — Z9181 History of falling: Secondary | ICD-10-CM | POA: Insufficient documentation

## 2012-02-19 DIAGNOSIS — I2510 Atherosclerotic heart disease of native coronary artery without angina pectoris: Secondary | ICD-10-CM | POA: Insufficient documentation

## 2012-02-19 DIAGNOSIS — I10 Essential (primary) hypertension: Secondary | ICD-10-CM | POA: Insufficient documentation

## 2012-02-19 DIAGNOSIS — R109 Unspecified abdominal pain: Secondary | ICD-10-CM | POA: Insufficient documentation

## 2012-02-19 DIAGNOSIS — K5900 Constipation, unspecified: Principal | ICD-10-CM | POA: Insufficient documentation

## 2012-02-19 DIAGNOSIS — K219 Gastro-esophageal reflux disease without esophagitis: Secondary | ICD-10-CM | POA: Insufficient documentation

## 2012-02-19 MED ORDER — ZOLPIDEM TARTRATE 5 MG PO TABS: 5.0000 mg | ORAL_TABLET | Freq: Every evening | ORAL | Status: DC | PRN

## 2012-02-19 MED ORDER — PANTOPRAZOLE SODIUM 40 MG IV SOLR
40.0000 mg | INTRAVENOUS | Status: DC
  Administered 2012-02-19: 40 mg via INTRAVENOUS
  Filled 2012-02-19 (×2): qty 40

## 2012-02-19 MED ORDER — SIMVASTATIN 20 MG PO TABS
20.0000 mg | ORAL_TABLET | Freq: Every day | ORAL | Status: DC
  Administered 2012-02-20 (×2): 20 mg via ORAL
  Filled 2012-02-19 (×3): qty 1

## 2012-02-19 MED ORDER — SODIUM CHLORIDE 0.9 % IV SOLN: INTRAVENOUS | Status: DC

## 2012-02-19 MED ORDER — POLYETHYLENE GLYCOL 3350 17 G PO PACK
17.0000 g | PACK | Freq: Every day | ORAL | Status: DC
  Administered 2012-02-20 – 2012-02-21 (×2): 17 g via ORAL
  Filled 2012-02-19 (×2): qty 1

## 2012-02-19 MED ORDER — ASPIRIN EC 325 MG PO TBEC
325.0000 mg | DELAYED_RELEASE_TABLET | Freq: Every day | ORAL | Status: DC
  Administered 2012-02-21: 325 mg via ORAL
  Filled 2012-02-19 (×2): qty 1

## 2012-02-19 MED ORDER — ZOLPIDEM TARTRATE 10 MG PO TABS: 10.0000 mg | ORAL_TABLET | Freq: Every evening | ORAL | Status: DC | PRN

## 2012-02-19 MED ORDER — AMLODIPINE BESYLATE 5 MG PO TABS
5.0000 mg | ORAL_TABLET | Freq: Every day | ORAL | Status: DC
  Administered 2012-02-20 – 2012-02-21 (×2): 5 mg via ORAL
  Filled 2012-02-19 (×2): qty 1

## 2012-02-19 MED ORDER — ACETAMINOPHEN 325 MG PO TABS
650.0000 mg | ORAL_TABLET | Freq: Four times a day (QID) | ORAL | Status: DC | PRN
  Administered 2012-02-20: 650 mg via ORAL
  Filled 2012-02-19: qty 2

## 2012-02-19 MED ORDER — ACETAMINOPHEN 650 MG RE SUPP: 650.0000 mg | Freq: Four times a day (QID) | RECTAL | Status: DC | PRN

## 2012-02-19 MED ORDER — BENAZEPRIL HCL 40 MG PO TABS
40.0000 mg | ORAL_TABLET | Freq: Every day | ORAL | Status: DC
  Administered 2012-02-20 – 2012-02-21 (×2): 40 mg via ORAL
  Filled 2012-02-19 (×2): qty 1

## 2012-02-19 MED ORDER — HEPARIN SODIUM (PORCINE) 5000 UNIT/ML IJ SOLN
5000.0000 [IU] | Freq: Three times a day (TID) | INTRAMUSCULAR | Status: DC
  Administered 2012-02-19 – 2012-02-21 (×5): 5000 [IU] via SUBCUTANEOUS
  Filled 2012-02-19 (×8): qty 1

## 2012-02-19 MED ORDER — DEXTROSE-NACL 5-0.45 % IV SOLN
INTRAVENOUS | Status: DC
  Administered 2012-02-19: 1000 mL via INTRAVENOUS

## 2012-02-19 MED ORDER — LORAZEPAM 0.5 MG PO TABS
0.5000 mg | ORAL_TABLET | Freq: Two times a day (BID) | ORAL | Status: DC
  Administered 2012-02-19 – 2012-02-21 (×4): 0.5 mg via ORAL
  Filled 2012-02-19 (×4): qty 1

## 2012-02-19 NOTE — H&P (Signed)
Andre Mueller is an 78 y.o. male.   Chief Complaint: Abdominal pain  HPI:  Patient is elderly male 51 use of age with multiple medical problems. He presented to the office with complaint of abdominal pain and nausea. He has had several visits to the emergency room, initial x-ray showed constipation, CT of the chest without any acute pathology. He still has some discomfort and nausea, he was seen in the office today by the nurse practitioner. He appealed week and a little unsteady and obviously has had poor oral intake as a result of the above complaints. Admission was deemed necessary for further evaluation and treatment.   Past Medical History  Diagnosis Date  . MI (myocardial infarction)   . Hypertension   . GERD (gastroesophageal reflux disease)   . Hernia   Hypercholesterolemia Coronary artery disease status post MI 2005 Glaucoma, currently not on medication Cataracts History of Waldenstrm's macroglobulinemia Multiple skin cancers BPH Allergic rhinitis Insomnia Esophagitis History GI bleed January 2012  Past Surgical History  Procedure Date  . Cardiac surgery   . Abdominal surgery   . Appendectomy    Family history father deceased from heart disease mother deceased from heart disease  No family history on file. Social History:  reports that he has never smoked. He does not have any smokeless tobacco history on file. He reports that he does not drink alcohol or use illicit drugs.  Allergies:  Allergies  Allergen Reactions  . Penicillins     Medications Prior to Admission  Medication Sig Dispense Refill  . amLODipine-benazepril (LOTREL) 5-40 MG per capsule Take 1 capsule by mouth daily.      Marland Kitchen ammonium lactate (AMLACTIN) 12 % cream Apply 1 Bottle topically as needed. Apply to cheek      . aspirin EC 81 MG tablet Take 81 mg by mouth daily.      . bisoprolol-hydrochlorothiazide (ZIAC) 5-6.25 MG per tablet Take 1 tablet by mouth daily.      Marland Kitchen LORazepam (ATIVAN) 0.5 MG  tablet Take 0.5 mg by mouth at bedtime.      . Multiple Vitamin (MULITIVITAMIN WITH MINERALS) TABS Take 1 tablet by mouth daily.      Marland Kitchen omeprazole (PRILOSEC) 20 MG capsule Take 20 mg by mouth daily.      . promethazine (PHENERGAN) 25 MG tablet Take 25 mg by mouth every 6 (six) hours as needed.      Marland Kitchen Propylene Glycol (SYSTANE BALANCE) 0.6 % SOLN Apply to eye.      . simvastatin (ZOCOR) 10 MG tablet Take 10 mg by mouth at bedtime.      . traMADol (ULTRAM) 50 MG tablet Take 1 tablet (50 mg total) by mouth every 6 (six) hours as needed for pain.  15 tablet  0  . zolpidem (AMBIEN) 10 MG tablet Take 10 mg by mouth at bedtime as needed. For sleep        Results for orders placed during the hospital encounter of 02/18/12 (from the past 48 hour(s))  CBC     Status: Abnormal   Collection Time   02/18/12  5:02 AM      Component Value Range Comment   WBC 7.2  4.0 - 10.5 (K/uL)    RBC 4.05 (*) 4.22 - 5.81 (MIL/uL)    Hemoglobin 13.5  13.0 - 17.0 (g/dL)    HCT 16.1 (*) 09.6 - 52.0 (%)    MCV 94.8  78.0 - 100.0 (fL)    MCH 33.3  26.0 -  34.0 (pg)    MCHC 35.2  30.0 - 36.0 (g/dL)    RDW 13.0  86.5 - 78.4 (%)    Platelets 192  150 - 400 (K/uL)   DIFFERENTIAL     Status: Abnormal   Collection Time   02/18/12  5:02 AM      Component Value Range Comment   Neutrophils Relative 71  43 - 77 (%)    Neutro Abs 5.1  1.7 - 7.7 (K/uL)    Lymphocytes Relative 15  12 - 46 (%)    Lymphs Abs 1.1  0.7 - 4.0 (K/uL)    Monocytes Relative 13 (*) 3 - 12 (%)    Monocytes Absolute 1.0  0.1 - 1.0 (K/uL)    Eosinophils Relative 1  0 - 5 (%)    Eosinophils Absolute 0.1  0.0 - 0.7 (K/uL)    Basophils Relative 0  0 - 1 (%)    Basophils Absolute 0.0  0.0 - 0.1 (K/uL)   BASIC METABOLIC PANEL     Status: Abnormal   Collection Time   02/18/12  5:02 AM      Component Value Range Comment   Sodium 133 (*) 135 - 145 (mEq/L)    Potassium 3.5  3.5 - 5.1 (mEq/L)    Chloride 97  96 - 112 (mEq/L)    CO2 24  19 - 32 (mEq/L)     Glucose, Bld 126 (*) 70 - 99 (mg/dL)    BUN 21  6 - 23 (mg/dL)    Creatinine, Ser 6.96  0.50 - 1.35 (mg/dL)    Calcium 9.2  8.4 - 10.5 (mg/dL)    GFR calc non Af Amer 76 (*) >90 (mL/min)    GFR calc Af Amer 88 (*) >90 (mL/min)    Ct Abdomen Pelvis W Contrast  02/18/2012  *RADIOLOGY REPORT*  Clinical Data: Constipation with upper abdominal pain.  CT ABDOMEN AND PELVIS WITH CONTRAST  Technique:  Multidetector CT imaging of the abdomen and pelvis was performed following the standard protocol during bolus administration of intravenous contrast.  Contrast: OMNIPAQUE IOHEXOL 300 MG/ML  SOLN  Comparison: CT chest 11/21/2010.  Findings: Lung bases show no acute findings.  There is a calcified pleural plaque along the right hemidiaphragm.  Tiny nodule in the posterior right lower lobe (image 25) is unchanged from 11/21/2010 and is therefore considered benign.  Heart is at the upper limits of normal in size.  No pericardial or pleural effusion. Small hiatal hernia with associated surgical clips.  Scattered tiny low density lesions in the liver are sub centimeter in size, as before.  Mild intrahepatic biliary duct dilatation, as before.  Pneumobilia has resolved.  Gallbladder is poorly visualized and may be surgically absent.  Adrenal glands are unremarkable.  A 6 mm low attenuation lesion in the interpolar right kidney is too small to definitively characterize but statistically is likely a cyst.  Left kidney, spleen, pancreas, stomach and proximal small bowel are unremarkable.  Unobstructed small bowel is seen within a right inguinal hernia.  Colon is unremarkable.  Small ventral hernia contains fat and vessels.  Small left inguinal hernia contains fat.  Atherosclerotic calcification of the arterial vasculature without abdominal aortic aneurysm.  No pathologically enlarged lymph nodes.  No free fluid.  Prostate is mildly enlarged. No worrisome lytic or sclerotic lesions.  IMPRESSION:  1.  Right inguinal hernia  contains unobstructed small bowel. 2.  Small ventral and left inguinal hernias contain fat. 3.  No acute  findings.  Original Report Authenticated By: Reyes Ivan, M.D.   Dg Abd Acute W/chest  02/18/2012  *RADIOLOGY REPORT*  Clinical Data: Constipation and upper abdominal pain.  ACUTE ABDOMEN SERIES (ABDOMEN 2 VIEW & CHEST 1 VIEW)  Comparison: 02/14/2012  Findings: Stable postoperative changes in the mediastinum.  Cardiac enlargement with normal pulmonary vascularity.  Probable atelectasis in the lung bases.  No pneumothorax.  No blunting of costophrenic angles.  Calcification of the aorta.  Mild gaseous distension of the colon with air-fluid levels consistent with liquid stool.  Changes are most consistent with ileus. Low colonic obstruction is not excluded.  Stable appearance since previous study.  No free intra-abdominal air.  Degenerative changes in the lumbar spine and hips.  IMPRESSION: Mild gaseous distension of the colon with liquid stool most consistent with ileus.  Stable appearance since previous study. Atelectasis in the lung bases.  Original Report Authenticated By: Marlon Pel, M.D.    ROSAs stated in the history of present illness  There were no vitals taken for this visit.Wt 176, Wt change -3 lb, Ht 67, BMI 27.56, Temp 97.6, Pulse sitting 76, BP sitting 140/80   Physical Exam  General Examination Lungs are clear. Heart regular rate and rhythm. Abdomen diffusely tender throughout. No masses organomegaly. No rebound or peritonitis. He does have bowel sounds. Rectal exam reveals guaiac-negative mucus.    Assessment/Plan Abdominal pain, etiology at this time presumed constipation. CT did not show acute pathology however did show a hernia containing nonobstructed small bowel. Patient could have possibly had a transiently incarcerated hernia. As his p.o. Intake is poor he still complains of pain he will be admitted for IV hydration, analgesia and further  evaluation. Clarie Camey D 02/19/2012, 1:13 PM

## 2012-02-20 LAB — BASIC METABOLIC PANEL
BUN: 22 mg/dL (ref 6–23)
Calcium: 8.7 mg/dL (ref 8.4–10.5)
Creatinine, Ser: 0.91 mg/dL (ref 0.50–1.35)
GFR calc Af Amer: 85 mL/min — ABNORMAL LOW (ref >90)

## 2012-02-20 MED ORDER — PANTOPRAZOLE SODIUM 40 MG PO TBEC
40.0000 mg | DELAYED_RELEASE_TABLET | Freq: Every day | ORAL | Status: DC
  Administered 2012-02-20: 40 mg via ORAL
  Filled 2012-02-20: qty 1

## 2012-02-20 NOTE — Progress Notes (Signed)
Subjective: Patient is alert oriented, he feels better, there is no nausea no vomiting no abdominal pain. No problems per nursing. He did be last night. [name] x-ray without any acute pathology. He did have frequent visits to the ED with complaints of abdominal pain, initial x-ray showing constipation ileus CT without any pathology but it is showing a little hernia containing some small bowel which was not incarcerated. I did discuss with him he could have transiently had some incarceration of the bowel. Or he could have had some unrecognized GI both. He denies any diarrhea  Objective: Vital signs in last 24 hours: Temp:  [97 F (36.1 C)-99 F (37.2 C)] 99 F (37.2 C) (04/24 0457) Pulse Rate:  [76-77] 77  (04/24 0457) Resp:  [17-19] 19  (04/24 0457) BP: (114-155)/(67-81) 114/67 mmHg (04/24 0457) SpO2:  [93 %-95 %] 93 % (04/24 0457) Weight:  [78.6 kg (173 lb 4.5 oz)] 78.6 kg (173 lb 4.5 oz) (04/24 0526) Weight change:  Last BM Date: 02/18/12  Intake/Output from previous day: 04/23 0701 - 04/24 0700 In: 659.2 [I.V.:659.2] Out: 225 [Urine:225] Intake/Output this shift: Total I/O In: -  Out: 200 [Urine:200]  General appearance: alert and cooperative Resp: clear to auscultation bilaterally Cardio: regular rate and rhythm, S1, S2 normal, no murmur, click, rub or gallop GI: soft, non-tender; bowel sounds normal; no masses,  no organomegaly  Lab Results:  No results found for this or any previous visit (from the past 24 hour(s)).    Studies/Results: No results found.  Medications:  Prior to Admission:  Prescriptions prior to admission  Medication Sig Dispense Refill  . amLODipine-benazepril (LOTREL) 5-40 MG per capsule Take 1 capsule by mouth daily.      Marland Kitchen ammonium lactate (AMLACTIN) 12 % cream Apply 1 Bottle topically as needed. Apply to cheek      . aspirin EC 81 MG tablet Take 81 mg by mouth daily.      . bisoprolol-hydrochlorothiazide (ZIAC) 5-6.25 MG per tablet Take 1  tablet by mouth daily.      Marland Kitchen LORazepam (ATIVAN) 0.5 MG tablet Take 0.5 mg by mouth at bedtime.      . Multiple Vitamin (MULITIVITAMIN WITH MINERALS) TABS Take 1 tablet by mouth daily.      Marland Kitchen omeprazole (PRILOSEC) 20 MG capsule Take 20 mg by mouth daily.      . promethazine (PHENERGAN) 25 MG tablet Take 25 mg by mouth every 6 (six) hours as needed.      Marland Kitchen Propylene Glycol (SYSTANE BALANCE) 0.6 % SOLN Apply to eye.      . simvastatin (ZOCOR) 10 MG tablet Take 10 mg by mouth at bedtime.      . traMADol (ULTRAM) 50 MG tablet Take 1 tablet (50 mg total) by mouth every 6 (six) hours as needed for pain.  15 tablet  0  . zolpidem (AMBIEN) 10 MG tablet Take 10 mg by mouth at bedtime as needed. For sleep       Scheduled:   . amLODipine  5 mg Oral Daily  . aspirin EC  325 mg Oral Daily  . benazepril  40 mg Oral Daily  . heparin  5,000 Units Subcutaneous Q8H  . LORazepam  0.5 mg Oral BID  . pantoprazole  40 mg Oral Q1200  . polyethylene glycol  17 g Oral Daily  . simvastatin  20 mg Oral q1800  . DISCONTD: pantoprazole (PROTONIX) IV  40 mg Intravenous Q24H   Continuous:   . dextrose 5 %  and 0.45% NaCl 1,000 mL (02/19/12 1642)  . DISCONTD: sodium chloride      Assessment/Plan: Abdominal pain, differential diagnoses as discussed previously, currently patient without any symptoms at the IV fluids. Previous labs without leukocytosis or to renal failure, his abdominal exam is benign, no additional labs or x-rays will be obtained at this time. He was a little weak upon presentation which probably was from dehydration, we will have physical therapy see patient and make further recommendations after evaluation by physical therapy GERD Hypertension Remote history of coronary artery disease   LOS: 1 day   Anayiah Howden D 02/20/2012, 12:45 PM

## 2012-02-20 NOTE — Progress Notes (Signed)
Physical Therapy Evaluation Note  Past Medical History  Diagnosis Date  . MI (myocardial infarction)   . Hypertension   . GERD (gastroesophageal reflux disease)   . Hernia    Past Surgical History  Procedure Date  . Cardiac surgery   . Abdominal surgery   . Appendectomy      02/20/12 1454  PT Visit Information  Last PT Received On 02/20/12  Assistance Needed +1  PT Time Calculation  PT Start Time 1454  PT Stop Time 1518  PT Time Calculation (min) 24 min  Subjective Data  Subjective Pt received supine in bed. Wife at bedside and anxious.  Precautions  Precautions Fall  Restrictions  Weight Bearing Restrictions No  Home Living  Lives With Spouse  Available Help at Discharge (wife)  Type of Home Independent living facility (goes to dining room and has someone who cleans)  Home Access Elevator  Home Layout One level  Bathroom Shower/Tub Walk-in shower  Bathroom Toilet Handicapped height  Bathroom Accessibility Yes  How Accessible Accessible via walker  Home Adaptive Equipment Grab bars around toilet;Grab bars in shower;Walker - rolling  Prior Function  Level of Independence Independent with assistive device(s)  Able to Take Stairs? No  Driving No  Communication  Communication HOH  Cognition  Overall Cognitive Status Appears within functional limits for tasks assessed/performed  Arousal/Alertness Awake/alert  Orientation Level Oriented X4 / Intact  Behavior During Session [facility] for tasks performed  Cognition - Other Comments delayed processing most likely due to [facility]  Right Upper Extremity Assessment  RUE ROM/Strength/Tone [facility] for tasks assessed  Left Upper Extremity Assessment  LUE ROM/Strength/Tone WFL for tasks assessed  Right Lower Extremity Assessment  RLE ROM/Strength/Tone WFL  Left Lower Extremity Assessment  LLE ROM/Strength/Tone WFL for tasks assessed  Trunk Assessment  Trunk Assessment Kyphotic  Bed Mobility  Bed Mobility Rolling Right;Right  Sidelying to Sit  Rolling Right 6: Modified independent (Device/Increase time);With rail  Right Sidelying to Sit 5: Supervision;With rails;HOB flat  Details for Bed Mobility Assistance pt with strong use of bed rail  Transfers  Transfers Sit to Stand;Stand to Sit  Sit to Stand 4: Min guard  Stand to Sit 4: Min guard;To chair/3-in-1  Ambulation/Gait  Ambulation/Gait Assistance 4: Min guard  Ambulation Distance (Feet) 150 Feet  Assistive device None  Ambulation/Gait Assistance Details shorts, shuffled gait pattern, increased trunk flexion  Gait Pattern Decreased stride length  Gait velocity slow  General Gait Details pt grandson "this is more than he's done all year." Grandson reports patient only amb to/from dinning room that is across the hall.   Balance  Balance Assessed Yes  Dynamic Standing Balance  Dynamic Standing - Balance Support No upper extremity supported  Dynamic Standing - Level of Assistance 4: Min assist  Dynamic Standing - Comments pt with wide base of support, contact guard due to first time working with patient  PT - End of Session  Equipment Utilized During Treatment Gait belt  Activity Tolerance Patient tolerated treatment well  Patient left in chair;with call bell/phone within reach;with family/visitor present (pt aware how to call light when ready to get back to bed grandson and wife present and aware patient not to get up on own   Nurse Communication Mobility status  PT Assessment  Clinical Impression Statement Pt admitted for abdominal discomfort. Per grandson patient functioning at baseline. patient and spouse very inactive at home only ambulating to dining room across the hall. Patient very seldomly goes into community. Patient  appears to be functioning at baseline. Grandson reports he feels safe sending him back to [employer] but desires him to start exercising. Patient spouse available 24/7.  PT Recommendation/Assessment Patient needs continued PT  services  PT Problem List Decreased activity tolerance;Decreased balance;Decreased mobility  PT Therapy Diagnosis  Generalized weakness  PT Plan  PT Frequency Min 2X/week  PT Treatment/Interventions Gait training;Functional mobility training;Therapeutic activities;Therapeutic exercise  PT Recommendation  Follow Up Recommendations No PT follow up  Equipment Recommended None recommended by PT  Individuals Consulted  Consulted and Agree with Results and Recommendations Patient  Acute Rehab PT Goals  PT Goal Formulation With patient  Time For Goal Achievement 03/05/12  Potential to Achieve Goals Fair  Pt will go Supine/Side to Sit Independently;with HOB 0 degrees  PT Goal: Supine/Side to Sit - Progress Goal set today  Pt will go Sit to Stand Independently  PT Goal: Sit to Stand - Progress Goal set today  Pt will Ambulate >150 feet;with least restrictive assistive device;with modified independence  PT Goal: Ambulate - Progress Goal set today  Pt will Perform Home Exercise Program Independently  PT Goal: Perform Home Exercise Program - Progress Goal set today  Written Expression  Dominant Hand Right    Pain: patient denies pain. Patient reports having BM earlier today.  Lewis Shock, PT, DPT Pager #: [phone] Office #: [phone]

## 2012-02-21 NOTE — Progress Notes (Signed)
a  CARE MANAGEMENT NOTE 02/21/2012  Patient:  ALESSIO, BOGAN   Account Number:  [id number]  Date Initiated:  02/20/2012  Documentation initiated by:  Ronny Flurry  Subjective/Objective Assessment:   DX: abdominal pain    Abdominal pain, etiology at this time presumed constipation. CT did not show acute pathology however did show a hernia containing nonobstructed small bowel. Patient could have possibly had a transiently incarcerated h     Action/Plan:   Order for HHPT, however pt declined this and is ready for d/c to home.   Anticipated DC Date:  02/21/2012   Anticipated DC Plan:  HOME/SELF CARE         Choice offered to / List presented to:             Status of service:  Completed, signed off Medicare Important Message given?   (If response is "NO", the following Medicare IM given date fields will be blank) Date Medicare IM given:   Date Additional Medicare IM given:    Discharge Disposition:  HOME/SELF CARE  Per UR Regulation:  Reviewed for med. necessity/level of care/duration of stay  If discussed at Long Length of Stay Meetings, dates discussed:    Comments:  02/21/2012 Pt declined offer for HHPT, and is ready for d/c. Johny Shock RN MPH Case manager [phone]

## 2012-02-21 NOTE — Discharge Summary (Signed)
Physician Discharge Summary  Patient ID: Andre Mueller MRN: [id number] DOB/AGE: 01/27/1921 78 y.o.  Admit date: 02/19/2012 Discharge date: 02/21/2012  Admission Diagnoses:abdominal pain  Discharge Diagnoses:  Abdominal pain,   GERD  Hypertension  Remote history of coronary artery disease   Discharged Condition: stable  Hospital Course:  Patient was directly admitted to the hospital from the office. He presented with complaint of abdominal pain. He had several recent visits to the ER with the same complaint. His abdominal pain prevented him from adequately hydrate himself as a result he was becoming weak and was felt to be a fall risk. His initial x-ray shows constipation, subsequent x-ray showed ileus, CT of the abdomen and pelvis was normal without any acute pathology. X-ray prior to admission without any acute pathology. Patient was hospitalized provide her with IV fluids and physical therapy. Patient had bowel movements and resolution of his abdominal pain. He was seen by physical therapy and recommendations were for continuance of physical therapy 2 times a week. At this time patient is at his baseline level of function is stable for discharge. Labs did reveal a mild hypokalemia which he can replace with food sources at home  Consults:    Significant Diagnostic Studies:Ct Abdomen Pelvis W Contrast  02/18/2012  *RADIOLOGY REPORT*  Clinical Data: Constipation with upper abdominal pain.  CT ABDOMEN AND PELVIS WITH CONTRAST  Technique:  Multidetector CT imaging of the abdomen and pelvis was performed following the standard protocol during bolus administration of intravenous contrast.  Contrast: OMNIPAQUE IOHEXOL 300 MG/ML  SOLN  Comparison: CT chest 11/21/2010.  Findings: Lung bases show no acute findings.  There is a calcified pleural plaque along the right hemidiaphragm.  Tiny nodule in the posterior right lower lobe (image 25) is unchanged from 11/21/2010 and is therefore considered  benign.  Heart is at the upper limits of normal in size.  No pericardial or pleural effusion. Small hiatal hernia with associated surgical clips.  Scattered tiny low density lesions in the liver are sub centimeter in size, as before.  Mild intrahepatic biliary duct dilatation, as before.  Pneumobilia has resolved.  Gallbladder is poorly visualized and may be surgically absent.  Adrenal glands are unremarkable.  A 6 mm low attenuation lesion in the interpolar right kidney is too small to definitively characterize but statistically is likely a cyst.  Left kidney, spleen, pancreas, stomach and proximal small bowel are unremarkable.  Unobstructed small bowel is seen within a right inguinal hernia.  Colon is unremarkable.  Small ventral hernia contains fat and vessels.  Small left inguinal hernia contains fat.  Atherosclerotic calcification of the arterial vasculature without abdominal aortic aneurysm.  No pathologically enlarged lymph nodes.  No free fluid.  Prostate is mildly enlarged. No worrisome lytic or sclerotic lesions.  IMPRESSION:  1.  Right inguinal hernia contains unobstructed small bowel. 2.  Small ventral and left inguinal hernias contain fat. 3.  No acute findings.  Original Report Authenticated By: Reyes Ivan, M.D.   Dg Abd Acute W/chest  02/18/2012  *RADIOLOGY REPORT*  Clinical Data: Constipation and upper abdominal pain.  ACUTE ABDOMEN SERIES (ABDOMEN 2 VIEW & CHEST 1 VIEW)  Comparison: 02/14/2012  Findings: Stable postoperative changes in the mediastinum.  Cardiac enlargement with normal pulmonary vascularity.  Probable atelectasis in the lung bases.  No pneumothorax.  No blunting of costophrenic angles.  Calcification of the aorta.  Mild gaseous distension of the colon with air-fluid levels consistent with liquid stool.  Changes are most  consistent with ileus. Low colonic obstruction is not excluded.  Stable appearance since previous study.  No free intra-abdominal air.  Degenerative changes  in the lumbar spine and hips.  IMPRESSION: Mild gaseous distension of the colon with liquid stool most consistent with ileus.  Stable appearance since previous study. Atelectasis in the lung bases.  Original Report Authenticated By: Marlon Pel, M.D.   Dg Abd Acute W/chest  02/14/2012  *RADIOLOGY REPORT*  Clinical Data: Diffuse abdominal pain.  ACUTE ABDOMEN SERIES (ABDOMEN 2 VIEW & CHEST 1 VIEW)  Comparison: 11/21/2010.  Findings: Frontal view of the chest shows midline trachea and stable heart size.  Thoracic aorta is calcified.  Mild scarring at the lung bases.  No pleural fluid.  Two views of the abdomen show gas and stool in mildly distended colon.  No definite small bowel dilatation.  IMPRESSION: Question mild colonic ileus with constipation.  Original Report Authenticated By: Reyes Ivan, M.D.      Discharge Exam: Blood pressure 130/70, pulse 78, temperature 97 F (36.1 C), temperature source Oral, resp. rate 18, height 5' 10.5" (1.791 m), weight 78.5 kg (173 lb 1 oz), SpO2 96.00%. General appearance: alert and cooperative Resp: clear to auscultation bilaterally Cardio: regular rate and rhythm, S1, S2 normal, no murmur, click, rub or gallop GI: soft, non-tender; bowel sounds normal; no masses,  no organomegaly Neurologic: Grossly normal  Disposition: 04-Intermediate Care Facility   Medication List  As of 02/21/2012  1:05 PM   TAKE these medications         amLODipine-benazepril 5-40 MG per capsule   Commonly known as: LOTREL   Take 1 capsule by mouth daily.      ammonium lactate 12 % cream   Commonly known as: AMLACTIN   Apply 1 Bottle topically as needed. Apply to cheek      aspirin EC 81 MG tablet   Take 81 mg by mouth daily.      bisoprolol-hydrochlorothiazide 5-6.25 MG per tablet   Commonly known as: ZIAC   Take 1 tablet by mouth daily.      LORazepam 0.5 MG tablet   Commonly known as: ATIVAN   Take 0.5 mg by mouth at bedtime.      mulitivitamin with  minerals Tabs   Take 1 tablet by mouth daily.      omeprazole 20 MG capsule   Commonly known as: PRILOSEC   Take 20 mg by mouth daily.      promethazine 25 MG tablet   Commonly known as: PHENERGAN   Take 25 mg by mouth every 6 (six) hours as needed.      simvastatin 10 MG tablet   Commonly known as: ZOCOR   Take 10 mg by mouth at bedtime.      SYSTANE BALANCE 0.6 % Soln   Generic drug: Propylene Glycol   Apply to eye.      traMADol 50 MG tablet   Commonly known as: ULTRAM   Take 1 tablet (50 mg total) by mouth every 6 (six) hours as needed for pain.      zolpidem 10 MG tablet   Commonly known as: AMBIEN   Take 10 mg by mouth at bedtime as needed. For sleep           Follow-up Information    Follow up with Katy Apo, MD.   Contact information:   [address] [place] [address] [phone]  SignedRenford Dills D 02/21/2012, 1:05 PM

## 2012-02-21 NOTE — Progress Notes (Deleted)
   CARE MANAGEMENT NOTE 02/21/2012  Patient:  Andre Mueller   Account Number:  [id number]  Date Initiated:  02/21/2012  Documentation initiated by:  Johny Shock  Subjective/Objective Assessment:   Order for home oxygen     Action/Plan:   Per O2 sats, pt able to recover room air sat with rest to 91% no oxygen required. therefore does not qualify for home oxygen.   Anticipated DC Date:  02/21/2012   Anticipated DC Plan:           Choice offered to / List presented to:             Status of service:  Completed, signed off Medicare Important Message given?   (If response is "NO", the following Medicare IM given date fields will be blank) Date Medicare IM given:   Date Additional Medicare IM given:    Discharge Disposition:  HOME/SELF CARE  Per UR Regulation:    If discussed at Long Length of Stay Meetings, dates discussed:    Comments:

## 2012-02-22 ENCOUNTER — Encounter: Payer: Self-pay | Admitting: Internal Medicine

## 2012-02-25 ENCOUNTER — Encounter: Payer: Self-pay | Admitting: Internal Medicine

## 2012-02-25 ENCOUNTER — Encounter (HOSPITAL_COMMUNITY): Payer: Self-pay

## 2012-02-25 ENCOUNTER — Emergency Department (HOSPITAL_COMMUNITY)
Admission: EM | Admit: 2012-02-25 | Discharge: 2012-02-25 | Disposition: A | Discharge status: Home / Self Care | Payer: Medicare Other | Attending: Emergency Medicine | Admitting: Emergency Medicine

## 2012-02-25 ENCOUNTER — Emergency Department (HOSPITAL_COMMUNITY): Payer: Medicare Other

## 2012-02-25 DIAGNOSIS — E7800 Pure hypercholesterolemia, unspecified: Secondary | ICD-10-CM | POA: Insufficient documentation

## 2012-02-25 DIAGNOSIS — K5900 Constipation, unspecified: Secondary | ICD-10-CM | POA: Insufficient documentation

## 2012-02-25 DIAGNOSIS — K219 Gastro-esophageal reflux disease without esophagitis: Secondary | ICD-10-CM | POA: Insufficient documentation

## 2012-02-25 DIAGNOSIS — Z79899 Other long term (current) drug therapy: Secondary | ICD-10-CM | POA: Insufficient documentation

## 2012-02-25 DIAGNOSIS — M545 Low back pain, unspecified: Secondary | ICD-10-CM | POA: Insufficient documentation

## 2012-02-25 DIAGNOSIS — I252 Old myocardial infarction: Secondary | ICD-10-CM | POA: Insufficient documentation

## 2012-02-25 DIAGNOSIS — R109 Unspecified abdominal pain: Secondary | ICD-10-CM | POA: Insufficient documentation

## 2012-02-25 DIAGNOSIS — I10 Essential (primary) hypertension: Secondary | ICD-10-CM | POA: Insufficient documentation

## 2012-02-25 DIAGNOSIS — K4091 Unilateral inguinal hernia, without obstruction or gangrene, recurrent: Secondary | ICD-10-CM | POA: Insufficient documentation

## 2012-02-25 HISTORY — DX: Pure hypercholesterolemia, unspecified: E78.00

## 2012-02-25 MED ORDER — DOCUSATE SODIUM 100 MG PO CAPS: 100.0000 mg | ORAL_CAPSULE | Freq: Two times a day (BID) | ORAL | Status: DC

## 2012-02-25 MED ORDER — BISACODYL 10 MG RE SUPP
10.0000 mg | Freq: Once | RECTAL | Status: AC
  Administered 2012-02-25: 10 mg via RECTAL
  Filled 2012-02-25: qty 1

## 2012-02-25 MED ORDER — MAGNESIUM CITRATE PO SOLN
1.0000 | Freq: Once | ORAL | Status: AC
  Administered 2012-02-25: 1 via ORAL
  Filled 2012-02-25: qty 296

## 2012-02-25 NOTE — ED Provider Notes (Addendum)
History     CSN: [id number]  Arrival date & time 02/25/12  1150   First MD Initiated Contact with Patient 02/25/12 1205      Chief Complaint  Patient presents with  . Abdominal Pain  . Constipation    (Consider location/radiation/quality/duration/timing/severity/associated sxs/prior treatment) HPI Comments: Patient presents with intermittent lower abdominal pain and constipation over the last few weeks.  Of note I saw this patient on a previous emergency department visit on April 18 at [facility].  Patient had similar symptoms that day.  After that admission patient did have another visit to the emergency department and a subsequent hospital admission for further evaluation of his symptoms.  He did have a CT scan which showed no acute pathology.  I did identify a hernia but with no signs of incarceration or entrapment.  Patient denies any fevers, nausea or vomiting.  He states he has not had a bowel movement now in the last 3 days since his discharge from the hospital.  He's been taking his MiraLAX every day as directed.  He believes the tramadol was a stool softener and so has not been on any other stool softeners or laxatives at home.  He has not noted any black or bloody stools.  Patient has no difficulty with urination.  He notes some radiation of his pain to his low back which is similar to the presentation on April 18th.    Patient is a 78 y.o. male presenting with abdominal pain and constipation. The history is provided by the patient. No language interpreter was used.  Abdominal Pain The primary symptoms of the illness include abdominal pain. The primary symptoms of the illness do not include fever, fatigue, shortness of breath, nausea, vomiting, diarrhea, hematemesis, hematochezia or dysuria. The current episode started more than 2 days ago. The onset of the illness was gradual. The problem has been gradually worsening.  Additional symptoms associated with the illness include  constipation. Symptoms associated with the illness do not include chills, hematuria or back pain.  Constipation  Associated symptoms include abdominal pain. Pertinent negatives include no fever, no diarrhea, no hematemesis, no nausea, no vomiting, no hematuria, no chest pain, no headaches, no coughing and no rash.    Past Medical History  Diagnosis Date  . MI (myocardial infarction)   . Hypertension   . GERD (gastroesophageal reflux disease)   . Hernia   . Hypercholesteremia     Past Surgical History  Procedure Date  . Cardiac surgery   . Abdominal surgery   . Appendectomy   . Coronary artery bypass graft     History reviewed. No pertinent family history.  History  Substance Use Topics  . Smoking status: Never Smoker   . Smokeless tobacco: Not on file  . Alcohol Use: No      Review of Systems  Constitutional: Negative.  Negative for fever, chills and fatigue.  HENT: Negative.   Eyes: Negative.  Negative for discharge and redness.  Respiratory: Negative.  Negative for cough and shortness of breath.   Cardiovascular: Negative.  Negative for chest pain.  Gastrointestinal: Positive for abdominal pain and constipation. Negative for nausea, vomiting, diarrhea, hematochezia and hematemesis.  Genitourinary: Negative.  Negative for dysuria and hematuria.  Musculoskeletal: Negative.  Negative for back pain.  Skin: Negative.  Negative for color change and rash.  Neurological: Negative for syncope and headaches.  Hematological: Negative.  Negative for adenopathy.  Psychiatric/Behavioral: Negative.  Negative for confusion.  All other systems reviewed and  are negative.    Allergies  Penicillins  Home Medications   Current Outpatient Rx  Name Route Sig Dispense Refill  . AMLODIPINE BESY-BENAZEPRIL HCL 5-40 MG PO CAPS Oral Take 1 capsule by mouth daily.    . AMMONIUM LACTATE 12 % EX CREA Topical Apply 1 Bottle topically as needed. Apply to cheek    . ASPIRIN EC 81 MG PO  TBEC Oral Take 81 mg by mouth daily.    Marland Kitchen BISOPROLOL-HYDROCHLOROTHIAZIDE 5-6.25 MG PO TABS Oral Take 1 tablet by mouth daily.    Marland Kitchen LORAZEPAM 0.5 MG PO TABS Oral Take 0.5 mg by mouth at bedtime.    . ADULT MULTIVITAMIN W/MINERALS CH Oral Take 1 tablet by mouth daily.    Marland Kitchen OMEPRAZOLE 20 MG PO CPDR Oral Take 20 mg by mouth daily.    Marland Kitchen PROMETHAZINE HCL 25 MG PO TABS Oral Take 25 mg by mouth every 6 (six) hours as needed.    Marland Kitchen PROPYLENE GLYCOL 0.6 % OP SOLN Both Eyes Place 1-2 drops into both eyes 2 (two) times daily.     Marland Kitchen SIMVASTATIN 10 MG PO TABS Oral Take 10 mg by mouth at bedtime.    . TRAMADOL HCL 50 MG PO TABS Oral Take 1 tablet (50 mg total) by mouth every 6 (six) hours as needed for pain. 15 tablet 0  . ZOLPIDEM TARTRATE 10 MG PO TABS Oral Take 10 mg by mouth at bedtime as needed. For sleep      BP 153/77  Pulse 73  Temp(Src) 98 F (36.7 C) (Oral)  Resp 21  Ht 5\' 11"  (1.803 m)  Wt 180 lb (81.647 kg)  BMI 25.10 kg/m2  SpO2 94%  Physical Exam  Nursing note and vitals reviewed. Constitutional: He is oriented to person, place, and time. He appears well-developed and well-nourished.  Non-toxic appearance. He does not have a sickly appearance.  HENT:  Head: Normocephalic and atraumatic.  Eyes: Conjunctivae, EOM and lids are normal. Pupils are equal, round, and reactive to light.  Neck: Trachea normal, normal range of motion and full passive range of motion without pain. Neck supple.  Cardiovascular: Normal rate, regular rhythm and normal heart sounds.   Pulmonary/Chest: Effort normal and breath sounds normal. No respiratory distress.  Abdominal: Soft. Normal appearance. He exhibits no distension. There is no tenderness. There is no rebound and no CVA tenderness.       Right inguinal hernia that is soft and reducible  Genitourinary:       Normal rectal tone.  Brown stool in the vault minimally.  No fecal impaction noted.  No blood or melena  Musculoskeletal: Normal range of motion.    Neurological: He is alert and oriented to person, place, and time. He has normal strength.  Skin: Skin is warm, dry and intact. No rash noted.  Psychiatric: He has a normal mood and affect. His behavior is normal. Judgment and thought content normal.    ED Course  Procedures (including critical care time)  Results for orders placed during the hospital encounter of 02/19/12  BASIC METABOLIC PANEL      Component Value Range   Sodium 134 (*) 135 - 145 (mEq/L)   Potassium 3.2 (*) 3.5 - 5.1 (mEq/L)   Chloride 99  96 - 112 (mEq/L)   CO2 23  19 - 32 (mEq/L)   Glucose, Bld 157 (*) 70 - 99 (mg/dL)   BUN 22  6 - 23 (mg/dL)   Creatinine, Ser 5.62  0.50 -  1.35 (mg/dL)   Calcium 8.7  8.4 - 91.4 (mg/dL)   GFR calc non Af Amer 73 (*) >90 (mL/min)   GFR calc Af Amer 85 (*) >90 (mL/min)   Ct Abdomen Pelvis W Contrast  02/18/2012  *RADIOLOGY REPORT*  Clinical Data: Constipation with upper abdominal pain.  CT ABDOMEN AND PELVIS WITH CONTRAST  Technique:  Multidetector CT imaging of the abdomen and pelvis was performed following the standard protocol during bolus administration of intravenous contrast.  Contrast: OMNIPAQUE IOHEXOL 300 MG/ML  SOLN  Comparison: CT chest 11/21/2010.  Findings: Lung bases show no acute findings.  There is a calcified pleural plaque along the right hemidiaphragm.  Tiny nodule in the posterior right lower lobe (image 25) is unchanged from 11/21/2010 and is therefore considered benign.  Heart is at the upper limits of normal in size.  No pericardial or pleural effusion. Small hiatal hernia with associated surgical clips.  Scattered tiny low density lesions in the liver are sub centimeter in size, as before.  Mild intrahepatic biliary duct dilatation, as before.  Pneumobilia has resolved.  Gallbladder is poorly visualized and may be surgically absent.  Adrenal glands are unremarkable.  A 6 mm low attenuation lesion in the interpolar right kidney is too small to definitively  characterize but statistically is likely a cyst.  Left kidney, spleen, pancreas, stomach and proximal small bowel are unremarkable.  Unobstructed small bowel is seen within a right inguinal hernia.  Colon is unremarkable.  Small ventral hernia contains fat and vessels.  Small left inguinal hernia contains fat.  Atherosclerotic calcification of the arterial vasculature without abdominal aortic aneurysm.  No pathologically enlarged lymph nodes.  No free fluid.  Prostate is mildly enlarged. No worrisome lytic or sclerotic lesions.  IMPRESSION:  1.  Right inguinal hernia contains unobstructed small bowel. 2.  Small ventral and left inguinal hernias contain fat. 3.  No acute findings.  Original Report Authenticated By: Reyes Ivan, M.D.   Dg Abd Acute W/chest  02/25/2012  *RADIOLOGY REPORT*  Clinical Data: Abdominal pain, constipation  ACUTE ABDOMEN SERIES (ABDOMEN 2 VIEW & CHEST 1 VIEW)  Comparison: 02/18/2012  Findings: Cardiomediastinal silhouette is stable.  No acute infiltrate or pulmonary edema.  Thoracic dextroscoliosis again noted.  Status post CABG. There is nonspecific nonobstructive bowel gas pattern.  Significant stool noted hepatic flexure of the colon which is distended measures about 10.5 cm in diameter.  Moderate stool noted in the distal colon and rectum.  No free abdominal air. Mild levoscoliosis lumbar spine.  IMPRESSION:  1.  No acute disease within chest. 2.  Significant stool noted right colon and hepatic flexure of the colon.  Some stool noted in the distal colon and rectum.  No free abdominal air.  Original Report Authenticated By: Natasha Mead, M.D.   Dg Abd Acute W/chest  02/18/2012  *RADIOLOGY REPORT*  Clinical Data: Constipation and upper abdominal pain.  ACUTE ABDOMEN SERIES (ABDOMEN 2 VIEW & CHEST 1 VIEW)  Comparison: 02/14/2012  Findings: Stable postoperative changes in the mediastinum.  Cardiac enlargement with normal pulmonary vascularity.  Probable atelectasis in the lung bases.   No pneumothorax.  No blunting of costophrenic angles.  Calcification of the aorta.  Mild gaseous distension of the colon with air-fluid levels consistent with liquid stool.  Changes are most consistent with ileus. Low colonic obstruction is not excluded.  Stable appearance since previous study.  No free intra-abdominal air.  Degenerative changes in the lumbar spine and hips.  IMPRESSION: Mild  gaseous distension of the colon with liquid stool most consistent with ileus.  Stable appearance since previous study. Atelectasis in the lung bases.  Original Report Authenticated By: Marlon Pel, M.D.   Dg Abd Acute W/chest  02/14/2012  *RADIOLOGY REPORT*  Clinical Data: Diffuse abdominal pain.  ACUTE ABDOMEN SERIES (ABDOMEN 2 VIEW & CHEST 1 VIEW)  Comparison: 11/21/2010.  Findings: Frontal view of the chest shows midline trachea and stable heart size.  Thoracic aorta is calcified.  Mild scarring at the lung bases.  No pleural fluid.  Two views of the abdomen show gas and stool in mildly distended colon.  No definite small bowel dilatation.  IMPRESSION: Question mild colonic ileus with constipation.  Original Report Authenticated By: Reyes Ivan, M.D.      MDM  Patient with recurrent constipation.  He does not have symptoms that appear to be consistent with obstruction at this time.  He has had multiple recent emergency department and inpatient visits regarding abdominal pain and it had workups including a CT scan this week.  His CT scan did not show any acute abnormalities.  Patient does have a right inguinal hernia but the area is soft and appears reducible and does not appear to show signs of incarceration at this time.  Patient does not have any impaction on rectal exam.  I will obtain an acute abdominal series to look for further signs of ileus or significant constipation.  When I previously saw this patient in the emergency department bicycle to work well for the patient's constipation I will  admit Mr. that here today for the patient's constipation.  Patient has been re\re counseled regarding use of MiraLAX and stool softeners since he misunderstood the tramadol.  He believes the tramadol was a stool softener.     Nat Christen, MD 02/25/12 1250  I had a prolonged discussion with the son regarding the patient's recurrent constipation over the last 2 weeks.  Patient does not appear to have signs of obstruction or problems with his hernia at this time.  He is passing gas.  He has no signs of small bowel obstruction on his x-ray but does show constipation on the right side which is the location of the patient's pain.  He had a recent CT scan on his admission in the last week that showed no acute findings.  Per the son the patient had just started taking the MiraLAX daily the last 3 days which may be why the patient has not had improvement in his symptoms.  I discussed with both the patient and his son the importance of adding other medications to the bowel regimen to assist in this patient having regular bowel movements.  The son is also called [facility] and scheduled an appointment tomorrow for 1:45 PM for followup.  I believe that once the patient is a bowel movement here he can be safely discharged with followup with GI tomorrow as scheduled.  The patient can continue on MiraLAX and we'll add either Colace or bicycle to the patient's regimen to assist in his symptoms.  Would also encourage good hydration at home.  I have also instructed the son to have his father minimized the tramadol which might worsen constipation.  Nat Christen, MD 02/25/12 [id number]

## 2012-02-25 NOTE — ED Notes (Signed)
Pt was brought in to the ER with c/o abdominal pain. Pt was seen at [facility] on 02/18/2012 with the same problem and was sent home with medicines for constipation with no relief. Pt is A/A/Ox4, skin is warm and dry, respiration is even and unlabored.

## 2012-02-25 NOTE — Discharge Instructions (Signed)
Please follow-up with your doctor and the stomach doctor as discussed.    Constipation in Adults Constipation is having fewer than 2 bowel movements per week. Usually, the stools are hard. As we grow older, constipation is more common. If you try to fix constipation with laxatives, the problem may get worse. This is because laxatives taken over a long period of time make the colon muscles weaker. A low-fiber diet, not taking in enough fluids, and taking some medicines may make these problems worse. MEDICATIONS THAT MAY CAUSE CONSTIPATION  Water pills (diuretics).   Calcium channel blockers (used to control blood pressure and for the heart).   Certain pain medicines (narcotics).   Anticholinergics.   Anti-inflammatory agents.   Antacids that contain aluminum.  DISEASES THAT CONTRIBUTE TO CONSTIPATION  Diabetes.   Parkinson's disease.   Dementia.   Stroke.   Depression.   Illnesses that cause problems with salt and water metabolism.  HOME CARE INSTRUCTIONS   Constipation is usually best cared for without medicines. Increasing dietary fiber and eating more fruits and vegetables is the best way to manage constipation.   Slowly increase fiber intake to 25 to 38 grams per day. Whole grains, fruits, vegetables, and legumes are good sources of fiber. A dietitian can further help you incorporate high-fiber foods into your diet.   Drink enough water and fluids to keep your urine clear or pale yellow.   A fiber supplement may be added to your diet if you cannot get enough fiber from foods.   Increasing your activities also helps improve regularity.   Suppositories, as suggested by your caregiver, will also help. If you are using antacids, such as aluminum or calcium containing products, it will be helpful to switch to products containing magnesium if your caregiver says it is okay.   If you have been given a liquid injection (enema) today, this is only a temporary measure. It  should not be relied on for treatment of longstanding (chronic) constipation.   Stronger measures, such as magnesium sulfate, should be avoided if possible. This may cause uncontrollable diarrhea. Using magnesium sulfate may not allow you time to make it to the bathroom.  SEEK IMMEDIATE MEDICAL CARE IF:   There is bright red blood in the stool.   The constipation stays for more than 4 days.   There is belly (abdominal) or rectal pain.   You do not seem to be getting better.   You have any questions or concerns.  MAKE SURE YOU:   Understand these instructions.   Will watch your condition.   Will get help right away if you are not doing well or get worse.  Document Released: 07/13/2004 Document Revised: 10/04/2011 Document Reviewed: 09/18/2011 [facility] Patient Information 2012 [place], [place].

## 2012-02-25 NOTE — ED Notes (Signed)
Pt attempting to have a bowel movement at present

## 2012-02-25 NOTE — ED Notes (Signed)
Pt 's SON called for D/C ride home.  SON reports he will be here in .

## 2012-02-25 NOTE — ED Notes (Signed)
Pt was brought in by EMS with c/o abdominal pain. Pt was seen at [facility] last Thursday and was seen with prescription to help him move his bowels. Wife said that he never had a bowel movement despite his medicines. Pt is c/o minimal nausea, denies any fever

## 2012-02-25 NOTE — ED Notes (Signed)
Pt took a few sips of the Magnesium Citrate and said that he is not drinking anymore because it tastes real bad and it is making him nauseous. Will let the ERMD know.

## 2012-02-25 NOTE — ED Provider Notes (Signed)
Pt to the CDU with constipation with plan to d/c home after moving bowels. He has had a small, formed but soft bowel movement since his arrival to CDU and reports no abdominal pain on reassessment . Pt is alert and oriented. Lungs CTAB. Heart RRR. ABd soft, NT, ND. Nml bowel sounds. MAEW. He feels comfortable with d/c home at this time and will follow up with GI and Dr Nehemiah Settle tomorrow. Will add colace to his bowel regimen.  Shaaron Adler, [place] 02/25/12 1824

## 2012-02-25 NOTE — ED Notes (Signed)
Dr. Golda Acre was made aware that the patient did not have any results from the suppository.

## 2012-02-26 ENCOUNTER — Telehealth: Payer: Self-pay | Admitting: Gastroenterology

## 2012-02-26 ENCOUNTER — Encounter: Payer: Self-pay | Admitting: Internal Medicine

## 2012-02-26 ENCOUNTER — Ambulatory Visit (INDEPENDENT_AMBULATORY_CARE_PROVIDER_SITE_OTHER): Payer: Medicare Other | Admitting: Internal Medicine

## 2012-02-26 DIAGNOSIS — K219 Gastro-esophageal reflux disease without esophagitis: Secondary | ICD-10-CM | POA: Insufficient documentation

## 2012-02-26 DIAGNOSIS — Z9889 Other specified postprocedural states: Secondary | ICD-10-CM

## 2012-02-26 DIAGNOSIS — E785 Hyperlipidemia, unspecified: Secondary | ICD-10-CM | POA: Insufficient documentation

## 2012-02-26 DIAGNOSIS — R1030 Lower abdominal pain, unspecified: Secondary | ICD-10-CM

## 2012-02-26 DIAGNOSIS — K5900 Constipation, unspecified: Secondary | ICD-10-CM | POA: Insufficient documentation

## 2012-02-26 DIAGNOSIS — H409 Unspecified glaucoma: Secondary | ICD-10-CM | POA: Insufficient documentation

## 2012-02-26 DIAGNOSIS — I2510 Atherosclerotic heart disease of native coronary artery without angina pectoris: Secondary | ICD-10-CM | POA: Insufficient documentation

## 2012-02-26 DIAGNOSIS — Z9049 Acquired absence of other specified parts of digestive tract: Secondary | ICD-10-CM

## 2012-02-26 DIAGNOSIS — R109 Unspecified abdominal pain: Secondary | ICD-10-CM

## 2012-02-26 DIAGNOSIS — I10 Essential (primary) hypertension: Secondary | ICD-10-CM | POA: Insufficient documentation

## 2012-02-26 DIAGNOSIS — M545 Low back pain: Secondary | ICD-10-CM

## 2012-02-26 MED ORDER — POLYETHYLENE GLYCOL 3350 17 GM/SCOOP PO POWD: 17.0000 g | Freq: Two times a day (BID) | ORAL | Status: DC

## 2012-02-26 MED ORDER — TRAMADOL HCL 50 MG PO TABS: 50.0000 mg | ORAL_TABLET | Freq: Three times a day (TID) | ORAL | Status: DC | PRN

## 2012-02-26 NOTE — Progress Notes (Signed)
Subjective:    Patient ID: Andre Mueller, male    DOB: [birth year], 78 y.o.   MRN: [id number]  HPI Andre Mueller is an 78 yo male with PMH of CAD, hypertension, hyperlipidemia, GERD, glaucoma, and remote bowel surgery (felt most likely to be partial small bowel resection, diagnosis unclear) who is seen on referral from the ER for evaluation of constipation. Andre Mueller is accompanied today by his wife and son. The patient reports 2-3 weeks of severe constipation. He reports prior to 3 weeks ago he was having regular daily bowel movements, without diarrhea or constipation. Associated with this constipation he's had lower abdominal pain which is somewhat cramping in nature. This has been associated with nausea but no vomiting. He also has had poor appetite and lost about 10 pounds. He is been seen in the ER on 4 occasions over last several weeks for constipation and lower abdominal pain. He was in the ER last night, and was given laxatives and discharged home. He did have a small bowel movement in the ER, and then woke up this morning with liquid fecal incontinence. This is his first such episode of fecal incontinence. He reports overall improvement in his lower abdominal pain, but this has migrated and is now mid to lower back pain. This back pain is somewhat severe and worse with moving. For the last 3 days he has been on MiraLAX 17 g daily and Colace 100 mg twice a day. Prior to this he was prescribed magnesium citrate but this has been very difficult for him to tolerate. He was given a prescription for tramadol 50 mg every 6 hours when necessary pain, and he has been using this with some benefit but not total relief. He denies fevers and chills. No recent falls.  He has never had a colonoscopy. It sounds as if one was ordered, and during his colonoscopy prep he had nausea and vomiting and what sounds like esophageal rupture. This was decades ago in [place]. He also reports a history of a bowel resection, the best I  can tell this is a small bowel resection for an unknown etiology. The family seems to remember being told this segment of bowel contained "blisters". They were never told Crohn's or ulcerative colitis.  Review of Systems As per history of present illness, otherwise negative except for stable dyspnea  Past Medical History  Diagnosis Date  . MI (myocardial infarction)   . Hypertension   . Hypercholesteremia   . Anxiety   . GERD (gastroesophageal reflux disease)   . Glaucoma   . Hernia   . Anemia    Past Surgical History  Procedure Date  . Cardiac surgery   . Abdominal surgery   . Appendectomy   . Coronary artery bypass graft   . Hernia repair    . Current Outpatient Prescriptions  Medication Sig Dispense Refill  . amLODipine-benazepril (LOTREL) 5-40 MG per capsule Take 1 capsule by mouth daily.      Marland Kitchen ammonium lactate (AMLACTIN) 12 % cream Apply 1 Bottle topically as needed. Apply to cheek      . aspirin EC 81 MG tablet Take 81 mg by mouth daily.      . bisoprolol-hydrochlorothiazide (ZIAC) 5-6.25 MG per tablet Take 1 tablet by mouth daily.      Marland Kitchen LORazepam (ATIVAN) 0.5 MG tablet Take 0.5 mg by mouth at bedtime.      . Multiple Vitamin (MULITIVITAMIN WITH MINERALS) TABS Take 1 tablet by mouth daily.      Marland Kitchen  omeprazole (PRILOSEC) 20 MG capsule Take 20 mg by mouth daily.      . polyethylene glycol powder (GLYCOLAX/MIRALAX) powder Take 17 g by mouth 2 (two) times daily. As directed  255 g  2  . promethazine (PHENERGAN) 25 MG tablet Take 25 mg by mouth every 6 (six) hours as needed.      Marland Kitchen Propylene Glycol (SYSTANE BALANCE) 0.6 % SOLN Place 1-2 drops into both eyes 2 (two) times daily.       . simvastatin (ZOCOR) 10 MG tablet Take 10 mg by mouth at bedtime.      . traMADol (ULTRAM) 50 MG tablet Take 1 tablet (50 mg total) by mouth every 6 (six) hours as needed for pain.  15 tablet  0  . zolpidem (AMBIEN) 10 MG tablet Take 10 mg by mouth at bedtime as needed. For sleep      . traMADol  (ULTRAM) 50 MG tablet Take 1 tablet (50 mg total) by mouth every 8 (eight) hours as needed for pain.  30 tablet  0   Allergies  Allergen Reactions  . Penicillins    Family History  Problem Relation Age of Onset  . Colon cancer Neg Hx    History  Substance Use Topics  . Smoking status: Former Games developer  . Smokeless tobacco: Never Used  . Alcohol Use: No       Objective:   Physical Exam BP 132/64  Pulse 60  Ht 5\' 10"  (1.778 m)  Wt 171 lb (77.565 kg)  BMI 24.54 kg/m2 Constitutional: Well-developed, elderly appearing male in no acute distress HEENT: Normocephalic and atraumatic. Oropharynx is clear and moist. No oropharyngeal exudate. Conjunctivae are normal. Pupils are equal round and reactive to light. No scleral icterus. Neck: Neck supple. Trachea midline. Cardiovascular: Normal rate, regular rhythm and intact distal pulses. No M/R/G Pulmonary/chest: Effort normal and mild end expiratory wheezing, no rhonchi/rales Abdominal: Soft, nontender, slightly distended without rebound or guarding Bowel sounds active throughout. There are no masses palpable. No hepatosplenomegaly. Extremities: no clubbing, cyanosis, or edema Lymphadenopathy: No cervical adenopathy noted. Neurological: Alert and oriented to person place and time. Skin: Skin is warm and dry. No rashes noted. Psychiatric: Normal mood and affect. Behavior is normal.  CBC    Component Value Date/Time   WBC 7.2 02/18/2012 0502   RBC 4.05* 02/18/2012 0502   HGB 13.5 02/18/2012 0502   HCT 38.4* 02/18/2012 0502   PLT 192 02/18/2012 0502   MCV 94.8 02/18/2012 0502   MCH 33.3 02/18/2012 0502   MCHC 35.2 02/18/2012 0502   RDW 13.5 02/18/2012 0502   LYMPHSABS 1.1 02/18/2012 0502   MONOABS 1.0 02/18/2012 0502   EOSABS 0.1 02/18/2012 0502   BASOSABS 0.0 02/18/2012 0502   CMP     Component Value Date/Time   NA 134* 02/20/2012 1336   K 3.2* 02/20/2012 1336   CL 99 02/20/2012 1336   CO2 23 02/20/2012 1336   GLUCOSE 157* 02/20/2012 1336    BUN 22 02/20/2012 1336   CREATININE 0.91 02/20/2012 1336   CALCIUM 8.7 02/20/2012 1336   PROT 9.9* 02/14/2012 1439   ALBUMIN 3.8 02/14/2012 1439   AST 20 02/14/2012 1439   ALT 11 02/14/2012 1439   ALKPHOS 66 02/14/2012 1439   BILITOT 1.0 02/14/2012 1439   GFRNONAA 73* 02/20/2012 1336   GFRAA 85* 02/20/2012 1336   Imaging reviewed 02/14/2012 through 02/25/2012 ACUTE ABDOMEN SERIES (ABDOMEN 2 VIEW & CHEST 1 VIEW)   Comparison: 11/21/2010.   Findings: Frontal  view of the chest shows midline trachea and stable heart size.  Thoracic aorta is calcified.  Mild scarring at the lung bases.  No pleural fluid.   Two views of the abdomen show gas and stool in mildly distended colon.  No definite small bowel dilatation.   IMPRESSION: Question mild colonic ileus with constipation. Comparison: 02/14/2012   Findings: Stable postoperative changes in the mediastinum.  Cardiac enlargement with normal pulmonary vascularity.  Probable atelectasis in the lung bases.  No pneumothorax.  No blunting of costophrenic angles.  Calcification of the aorta.   Mild gaseous distension of the colon with air-fluid levels consistent with liquid stool.  Changes are most consistent with ileus. Low colonic obstruction is not excluded.  Stable appearance since previous study.  No free intra-abdominal air.  Degenerative changes in the lumbar spine and hips.   IMPRESSION: Mild gaseous distension of the colon with liquid stool most consistent with ileus.  Stable appearance since previous study. Atelectasis in the lung bases.   Clinical Data: Constipation with upper abdominal pain.   CT ABDOMEN AND PELVIS WITH CONTRAST   Technique:  Multidetector CT imaging of the abdomen and pelvis was performed following the standard protocol during bolus administration of intravenous contrast.   Contrast: OMNIPAQUE IOHEXOL 300 MG/ML  SOLN   Comparison: CT chest 11/21/2010.   Findings: Lung bases show no acute findings.   There is a calcified pleural plaque along the right hemidiaphragm.  Tiny nodule in the posterior right lower lobe (image 25) is unchanged from 11/21/2010 and is therefore considered benign.  Heart is at the upper limits of normal in size.  No pericardial or pleural effusion. Small hiatal hernia with associated surgical clips.   Scattered tiny low density lesions in the liver are sub centimeter in size, as before.  Mild intrahepatic biliary duct dilatation, as before.  Pneumobilia has resolved.  Gallbladder is poorly visualized and may be surgically absent.  Adrenal glands are unremarkable.  A 6 mm low attenuation lesion in the interpolar right kidney is too small to definitively characterize but statistically is likely a cyst.  Left kidney, spleen, pancreas, stomach and proximal small bowel are unremarkable.  Unobstructed small bowel is seen within a right inguinal hernia.  Colon is unremarkable.   Small ventral hernia contains fat and vessels.  Small left inguinal hernia contains fat.  Atherosclerotic calcification of the arterial vasculature without abdominal aortic aneurysm.  No pathologically enlarged lymph nodes.  No free fluid.  Prostate is mildly enlarged. No worrisome lytic or sclerotic lesions.   IMPRESSION:   1.  Right inguinal hernia contains unobstructed small bowel. 2.  Small ventral and left inguinal hernias contain fat. 3.  No acute findings.   ACUTE ABDOMEN SERIES (ABDOMEN 2 VIEW & CHEST 1 VIEW)   Comparison: 02/18/2012   Findings: Cardiomediastinal silhouette is stable.  No acute infiltrate or pulmonary edema.  Thoracic dextroscoliosis again noted.  Status post CABG. There is nonspecific nonobstructive bowel gas pattern.  Significant stool noted hepatic flexure of the colon which is distended measures about 10.5 cm in diameter.  Moderate stool noted in the distal colon and rectum.  No free abdominal air. Mild levoscoliosis lumbar spine.   IMPRESSION:   1.   No acute disease within chest. 2.  Significant stool noted right colon and hepatic flexure of the colon.  Some stool noted in the distal colon and rectum.  No free abdominal air.     Assessment & Plan:   78 yo  male with PMH of CAD, hypertension, hyperlipidemia, GERD, glaucoma, and remote bowel surgery (felt most likely to be partial small bowel resection, diagnosis unclear) who is seen on referral from the ER for evaluation of constipation  1. Constipation/lower back pain -- the patient has had fairly acute change in bowel habits, with severe constipation necessitating multiple ER visits over the last 2-3 weeks. The etiology for this new constipation is unclear. Ideally, colonoscopy would be performed, and we discussed this at length today. I think that this test may still need to be done, however I think he would have a very difficult time prepping for this test at home in his current state. For now I would like to increase his MiraLAX to 17 g twice a day. He can discontinue Colace, as this is likely not extremely beneficial now. It is unclear how his back pain is related, perhaps it is due to muscle strain/spasm from constipation and straining at stool. I would like to perform thoracic and lumbar spine plain films to exclude compression fracture which may have resulted in constipation. The CT is reviewed and unremarkable, but plain films have revealed increased colonic stool burden and perhaps mild colonic ileus without severe dilatation. He is taking tramadol 50 mg with some benefit, but I will increase this to 100 mg every 8 hours when necessary pain. I've asked that he call the office on Thursday of this week to update [place] on his current symptoms/progress with the new medicine regimen. If he is no better, we'll consider hospital admission, and if he does require repeat ER visit, then admission for further evaluation in the acute setting is very reasonable. Should he represent to the ER for this reason,  I recommend hospitalist admission with GI consultation.  The patient and family is agreeable to this plan.

## 2012-02-26 NOTE — Patient Instructions (Signed)
We have sent the following medications to your pharmacy for you to pick up at your convenience: Ultram, please take as prescribed  Call our office on Thursday 5/2/213 to let Dr. Rhea Belton know how you are doing.  [phone]

## 2012-02-26 NOTE — Telephone Encounter (Signed)
Spoke to PT. Regarding his referral to Primary Care. He has an appointment with Dr. Felicity Coyer on 08/20/2012 @ 9:30am pt stated he understood.

## 2012-02-26 NOTE — ED Provider Notes (Signed)
Medical screening examination/treatment/procedure(s) were conducted as a shared visit with non-physician practitioner(s) and myself.  I personally evaluated the patient during the encounter   Nat Christen, MD 02/26/12 [phone]

## 2012-02-27 DIAGNOSIS — D126 Benign neoplasm of colon, unspecified: Secondary | ICD-10-CM

## 2012-02-27 HISTORY — DX: Benign neoplasm of colon, unspecified: D12.6

## 2012-02-28 ENCOUNTER — Encounter (HOSPITAL_COMMUNITY): Payer: Self-pay | Admitting: Nurse Practitioner

## 2012-02-28 ENCOUNTER — Inpatient Hospital Stay (HOSPITAL_COMMUNITY): Payer: Medicare Other

## 2012-02-28 ENCOUNTER — Telehealth: Payer: Self-pay | Admitting: Internal Medicine

## 2012-02-28 ENCOUNTER — Observation Stay (HOSPITAL_COMMUNITY)
Admission: AD | Admit: 2012-02-28 | Discharge: 2012-03-02 | Disposition: A | Discharge status: Home / Self Care | Payer: Medicare Other | Source: Ambulatory Visit | Attending: Internal Medicine | Admitting: Internal Medicine

## 2012-02-28 DIAGNOSIS — R109 Unspecified abdominal pain: Secondary | ICD-10-CM | POA: Insufficient documentation

## 2012-02-28 DIAGNOSIS — D126 Benign neoplasm of colon, unspecified: Secondary | ICD-10-CM

## 2012-02-28 DIAGNOSIS — M545 Low back pain, unspecified: Secondary | ICD-10-CM | POA: Insufficient documentation

## 2012-02-28 DIAGNOSIS — E785 Hyperlipidemia, unspecified: Secondary | ICD-10-CM

## 2012-02-28 DIAGNOSIS — K5900 Constipation, unspecified: Principal | ICD-10-CM | POA: Insufficient documentation

## 2012-02-28 DIAGNOSIS — R198 Other specified symptoms and signs involving the digestive system and abdomen: Secondary | ICD-10-CM | POA: Insufficient documentation

## 2012-02-28 DIAGNOSIS — K648 Other hemorrhoids: Secondary | ICD-10-CM | POA: Insufficient documentation

## 2012-02-28 DIAGNOSIS — K5730 Diverticulosis of large intestine without perforation or abscess without bleeding: Secondary | ICD-10-CM | POA: Insufficient documentation

## 2012-02-28 DIAGNOSIS — K4090 Unilateral inguinal hernia, without obstruction or gangrene, not specified as recurrent: Secondary | ICD-10-CM | POA: Insufficient documentation

## 2012-02-28 DIAGNOSIS — K219 Gastro-esophageal reflux disease without esophagitis: Secondary | ICD-10-CM

## 2012-02-28 DIAGNOSIS — Z9049 Acquired absence of other specified parts of digestive tract: Secondary | ICD-10-CM

## 2012-02-28 DIAGNOSIS — I10 Essential (primary) hypertension: Secondary | ICD-10-CM | POA: Insufficient documentation

## 2012-02-28 DIAGNOSIS — Z79899 Other long term (current) drug therapy: Secondary | ICD-10-CM | POA: Insufficient documentation

## 2012-02-28 DIAGNOSIS — I2510 Atherosclerotic heart disease of native coronary artery without angina pectoris: Secondary | ICD-10-CM | POA: Insufficient documentation

## 2012-02-28 DIAGNOSIS — E7800 Pure hypercholesterolemia, unspecified: Secondary | ICD-10-CM | POA: Insufficient documentation

## 2012-02-28 DIAGNOSIS — R269 Unspecified abnormalities of gait and mobility: Secondary | ICD-10-CM | POA: Insufficient documentation

## 2012-02-28 DIAGNOSIS — I252 Old myocardial infarction: Secondary | ICD-10-CM | POA: Insufficient documentation

## 2012-02-28 MED ORDER — ZOLPIDEM TARTRATE 10 MG PO TABS
10.0000 mg | ORAL_TABLET | Freq: Every evening | ORAL | Status: DC | PRN
Start: 2012-02-28 — End: 2012-03-02
  Administered 2012-02-28 – 2012-02-29 (×2): 10 mg via ORAL
  Filled 2012-02-28: qty 1

## 2012-02-28 MED ORDER — MAGNESIUM CITRATE PO SOLN
1.0000 | Freq: Once | ORAL | Status: AC
  Administered 2012-02-29: 1 via ORAL

## 2012-02-28 MED ORDER — TRAMADOL HCL 50 MG PO TABS
50.0000 mg | ORAL_TABLET | Freq: Four times a day (QID) | ORAL | Status: DC | PRN
  Administered 2012-02-28 – 2012-03-01 (×4): 50 mg via ORAL
  Filled 2012-02-28 (×4): qty 1

## 2012-02-28 MED ORDER — SODIUM CHLORIDE 0.9 % IV SOLN
INTRAVENOUS | Status: DC
  Administered 2012-02-28 – 2012-02-29 (×2): via INTRAVENOUS

## 2012-02-28 NOTE — Progress Notes (Signed)
I have reviewed the above note, examined the patient and agree with plan of treatment.Please see P.Guenther,NP note. He has a reducible right inguinal hernia, with small bowl loops in it. No stool in the rectum. I suspect a high up impaction. We will prep with enemas to avoid possible vomiting in view of hx of ? Esophageal tear.

## 2012-02-28 NOTE — Progress Notes (Signed)
Gastrografin enema does not show any obstruction, large amount of stool present. Will prep for colonoscopy tomorrow.

## 2012-02-28 NOTE — Progress Notes (Signed)
Tap water enema given to patient. Patient tolerated well. Patient did not pass any formed stool, but complete liquid. Will continue to monitor patient

## 2012-02-28 NOTE — Telephone Encounter (Signed)
Pt reports he still has constipation. He had the urge to go at 6am, but no BM. His pain pain continues and he has increased the Miralax to BID. His breakfast this am consisted of Shredded Wheat, milk and prunes. Please advise. Thanks.

## 2012-02-28 NOTE — H&P (Signed)
Primary Care Physician:  Katy Apo, MD, MD Primary Gastroenterologist:  Danny Lawless  CHIEF COMPLAINT:  Constipation  HPI: Andre Mueller is a 78 y.o. male Andre Mueller is an 78 yo male recently seen by Dr. Rhea Belton for a 2-3 week history of severe constipation associated with lower abdominal cramping. Prior to 3 weeks ago he was having regular daily bowel movements. He has had some nausea but no vomiting. He also reports a 10 pound weight loss and low back pain. Patient has been evaluated in the ED several times recently for symptoms. Abdominal series have suggested colonic ileus and another time showed significant stool in right colon and hepatic flexure. A contrast CTscan basically unrevealing though he did had a right inguinal hernia containing unobstructed small bowel. Patient has had ineffective responses to Miralax, colace and magnesium citrate.  He is just passing a lot of gas, little stool. He has never had a colonoscopy. 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  He also reports a history of a bowel resection, ? small bowel. At time of patient's visit with Dr. Rhea Belton patient's Miralax was increased to BID. There was a discussion of colonoscopy but we were concerned about proceeding given patient's significant back pain. We suggested xrays of back be done to rule out compression fracture. In the interim patient called the office with progressive abdominal pain and constipation. He is being admitted for further evaluation and treatment.    Past Medical History  Diagnosis Date  . MI (myocardial infarction)   . Hypertension   . Hypercholesteremia   . Anxiety   . GERD (gastroesophageal reflux disease)   . Glaucoma   . Hernia   . Anemia     Past Surgical History  Procedure Date  . Cardiac surgery   . Abdominal surgery   . Appendectomy   . Coronary artery bypass graft   . Hernia repair     Prior  to Admission medications   Medication Sig Start Date End Date Taking? Authorizing Provider  amLODipine-benazepril (LOTREL) 5-40 MG per capsule Take 1 capsule by mouth daily.   Yes Katy Apo, MD  ammonium lactate (AMLACTIN) 12 % cream Apply 1 Bottle topically as needed. Apply to cheek   Yes Historical Provider, MD  aspirin EC 81 MG tablet Take 81 mg by mouth daily.   Yes Historical Provider, MD  bisoprolol-hydrochlorothiazide (ZIAC) 5-6.25 MG per tablet Take 1 tablet by mouth daily.   Yes Katy Apo, MD  LORazepam (ATIVAN) 0.5 MG tablet Take 0.5 mg by mouth at bedtime.   Yes Historical Provider, MD  Multiple Vitamin (MULITIVITAMIN WITH MINERALS) TABS Take 1 tablet by mouth daily.   Yes Historical Provider, MD  omeprazole (PRILOSEC) 20 MG capsule Take 20 mg by mouth daily.   Yes Historical Provider, MD  polyethylene glycol powder (GLYCOLAX/MIRALAX) powder Take 17 g by mouth 2 (two) times daily. As directed 02/26/12  Yes Beverley Fiedler, MD  promethazine (PHENERGAN) 25 MG tablet Take 25 mg by mouth every 6 (six) hours as needed.   Yes Historical Provider, MD  Propylene Glycol (SYSTANE BALANCE) 0.6 % SOLN Place 1-2 drops into both eyes 2 (two) times daily.    Yes Historical Provider, MD  simvastatin (ZOCOR) 10 MG tablet Take 10 mg by mouth at bedtime.   Yes Historical Provider, MD  traMADol (ULTRAM) 50 MG tablet Take 1 tablet (50 mg total) by  mouth every 8 (eight) hours as needed for pain. 02/26/12 02/25/13 Yes Beverley Fiedler, MD  zolpidem (AMBIEN) 10 MG tablet Take 10 mg by mouth at bedtime as needed. For sleep   Yes Historical Provider, MD    No current facility-administered medications for this encounter.    Allergies as of 02/28/2012 - Review Complete 02/28/2012  Allergen Reaction Noted  . Penicillins Swelling 02/14/2012    Family History  Problem Relation Age of Onset  . Colon cancer Neg Hx     History   Social History  . Marital Status: Married    Spouse Name: N/A    Number of  Children: 2  . Years of Education: N/A   Occupational History  . RETIRED    Social History Main Topics  . Smoking status: Former Games developer  . Smokeless tobacco: Never Used  . Alcohol Use: No  . Drug Use: No  . Sexually Active: Not on file   Other Topics Concern  . Not on file   Social History Narrative   Daily caffeine     Review of Systems: All systems reviewed and negative except where noted in HPI  Physical Exam: Vital signs in last 24 hours: Temp:  [98.5 F (36.9 C)] 98.5 F (36.9 C) (05/02 1100) Pulse Rate:  [77] 77  (05/02 1100) Resp:  [18] 18  (05/02 1100) BP: (110)/(65) 110/65 mmHg (05/02 1100) SpO2:  [92 %] 92 % (05/02 1100) Weight:  [170 lb 10.2 oz (77.4 kg)] 170 lb 10.2 oz (77.4 kg) (05/02 1100)   General:   Alert,  well-developed,white male in NAD Head:  Normocephalic and atraumatic. Eyes:  Sclera clear, no icterus.   Conjunctiva pink. Ears:  Normal auditory acuity. Neck:  Supple; no masses Lungs:  Clear throughout to auscultation except for a few Rhonchi in RLL.  Heart:  Regular rate and rhythm Abdomen:  Soft, nontender. He is mildly distended with a few abnormal bowel sounds. No obvious masses.   Rectal:  No impaction. Scant amount of light brown stool in vault. Msk:  Symmetrical without gross deformities. Normal posture. Pulses:  Normal pulses noted. Extremities:  Without edema. Neurologic:  Alert and  oriented   grossly normal neurologically. Skin:  Intact without significant lesions or rashes. Cervical Nodes:  No significant cervical adenopathy. Psych:  Alert and cooperative. Normal mood and affect.   Impression / Plan:  69. 78 year old white male with acute constipation, lower abdominal pain and back pain. Rule out colon neoplasm. Patient needs a colonoscopy but I think he may need a gastrograffin enema to help clean out his bowels as Miralax, colace and magnesium citrate really haven't worked. Furthermore patient is nauseated and likely couldn't  tolerate a prep. Will give him some enemas tonight and in am as well. Plan is for a colonoscopy to be done when adequately prepped.   2. CAD / history of MI  3. HTN, stable on meds. Will continue home meds.    4. GERD, on daily PPI       LOS: 0 days   Willette Cluster  02/28/2012, 12:38 PM

## 2012-02-28 NOTE — H&P (Signed)
I have reviewed the above note, examined the patient and agree with plan of treatment.Plan to proceed with gastrografin BE to stimulate  Colon motility, then high volume enemas. He has a right inguinal hernia which is reducible, small bowl looped in the hernia. Eventually he will need colonoscopy pending response to laxatives.

## 2012-02-28 NOTE — Telephone Encounter (Signed)
Informed pt Dr Rhea Belton would like to admit him to the hospital for prepping for scans and/or possible COLON. Per Admitting, pt will be a direct admit to telemetry at [facility]. Pt stated understanding; he was informed to check in at Admitting.

## 2012-02-29 ENCOUNTER — Encounter (HOSPITAL_COMMUNITY): Payer: Self-pay | Admitting: *Deleted

## 2012-02-29 ENCOUNTER — Inpatient Hospital Stay (HOSPITAL_COMMUNITY): Payer: Medicare Other

## 2012-02-29 ENCOUNTER — Encounter (HOSPITAL_COMMUNITY)
Admission: AD | Disposition: A | Discharge status: Home / Self Care | Payer: Self-pay | Source: Ambulatory Visit | Attending: Internal Medicine

## 2012-02-29 DIAGNOSIS — D126 Benign neoplasm of colon, unspecified: Secondary | ICD-10-CM

## 2012-02-29 HISTORY — PX: COLONOSCOPY: SHX5424

## 2012-02-29 SURGERY — COLONOSCOPY
Anesthesia: Moderate Sedation

## 2012-02-29 MED ORDER — HYDROCORTISONE 2.5 % RE CREA
TOPICAL_CREAM | Freq: Three times a day (TID) | RECTAL | Status: DC
  Administered 2012-02-29: 1 via RECTAL
  Administered 2012-03-01 – 2012-03-02 (×3): via RECTAL
  Filled 2012-02-29: qty 28.35

## 2012-02-29 MED ORDER — ZOLPIDEM TARTRATE 10 MG PO TABS
10.0000 mg | ORAL_TABLET | Freq: Every evening | ORAL | Status: DC | PRN
  Filled 2012-02-29: qty 1

## 2012-02-29 MED ORDER — AMLODIPINE BESY-BENAZEPRIL HCL 5-40 MG PO CAPS: 1.0000 | ORAL_CAPSULE | Freq: Every day | ORAL | Status: DC

## 2012-02-29 MED ORDER — POLYVINYL ALCOHOL 1.4 % OP SOLN
1.0000 [drp] | Freq: Two times a day (BID) | OPHTHALMIC | Status: DC
  Administered 2012-02-29: 2 [drp] via OPHTHALMIC
  Administered 2012-02-29 – 2012-03-01 (×2): 1 [drp] via OPHTHALMIC
  Administered 2012-03-01: 2 [drp] via OPHTHALMIC
  Administered 2012-03-02: 1 [drp] via OPHTHALMIC
  Filled 2012-02-29: qty 15

## 2012-02-29 MED ORDER — EPINEPHRINE HCL 0.1 MG/ML IJ SOLN
INTRAMUSCULAR | Status: AC
  Filled 2012-02-29: qty 10

## 2012-02-29 MED ORDER — LORAZEPAM 0.5 MG PO TABS
0.5000 mg | ORAL_TABLET | Freq: Every day | ORAL | Status: DC
  Administered 2012-03-01: 0.5 mg via ORAL
  Filled 2012-02-29: qty 1

## 2012-02-29 MED ORDER — PROPYLENE GLYCOL 0.6 % OP SOLN: 1.0000 [drp] | Freq: Two times a day (BID) | OPHTHALMIC | Status: DC

## 2012-02-29 MED ORDER — BENAZEPRIL HCL 40 MG PO TABS
40.0000 mg | ORAL_TABLET | Freq: Every day | ORAL | Status: DC
  Administered 2012-02-29 – 2012-03-02 (×3): 40 mg via ORAL
  Filled 2012-02-29 (×3): qty 1

## 2012-02-29 MED ORDER — HYDROCORTISONE 2.5 % RE CREA: TOPICAL_CREAM | Freq: Two times a day (BID) | RECTAL | Status: DC

## 2012-02-29 MED ORDER — FENTANYL NICU IV SYRINGE 50 MCG/ML
INJECTION | INTRAMUSCULAR | Status: DC | PRN
  Administered 2012-02-29 (×4): 12.5 ug via INTRAVENOUS

## 2012-02-29 MED ORDER — ADULT MULTIVITAMIN W/MINERALS CH
1.0000 | ORAL_TABLET | Freq: Every day | ORAL | Status: DC
  Administered 2012-02-29 – 2012-03-02 (×3): 1 via ORAL
  Filled 2012-02-29 (×3): qty 1

## 2012-02-29 MED ORDER — POLYETHYLENE GLYCOL 3350 17 GM/SCOOP PO POWD: 17.0000 g | Freq: Two times a day (BID) | ORAL | Status: DC

## 2012-02-29 MED ORDER — TRAMADOL HCL 50 MG PO TABS: 50.0000 mg | ORAL_TABLET | Freq: Three times a day (TID) | ORAL | Status: DC | PRN

## 2012-02-29 MED ORDER — ASPIRIN EC 81 MG PO TBEC
81.0000 mg | DELAYED_RELEASE_TABLET | Freq: Every day | ORAL | Status: DC
  Administered 2012-02-29 – 2012-03-02 (×3): 81 mg via ORAL
  Filled 2012-02-29 (×3): qty 1

## 2012-02-29 MED ORDER — AMMONIUM LACTATE 12 % EX CREA: 1.0000 | TOPICAL_CREAM | CUTANEOUS | Status: DC | PRN

## 2012-02-29 MED ORDER — AMLODIPINE BESYLATE 5 MG PO TABS
5.0000 mg | ORAL_TABLET | Freq: Every day | ORAL | Status: DC
  Administered 2012-02-29 – 2012-03-02 (×3): 5 mg via ORAL
  Filled 2012-02-29 (×3): qty 1

## 2012-02-29 MED ORDER — DOCUSATE SODIUM 100 MG PO CAPS: 100.0000 mg | ORAL_CAPSULE | Freq: Two times a day (BID) | ORAL | Status: DC

## 2012-02-29 MED ORDER — BISOPROLOL-HYDROCHLOROTHIAZIDE 5-6.25 MG PO TABS
1.0000 | ORAL_TABLET | Freq: Every day | ORAL | Status: DC
  Administered 2012-02-29 – 2012-03-02 (×3): 1 via ORAL
  Filled 2012-02-29 (×3): qty 1

## 2012-02-29 MED ORDER — FENTANYL CITRATE 0.05 MG/ML IJ SOLN
INTRAMUSCULAR | Status: AC
  Filled 2012-02-29: qty 4

## 2012-02-29 MED ORDER — PROMETHAZINE HCL 12.5 MG PO TABS
12.5000 mg | ORAL_TABLET | Freq: Four times a day (QID) | ORAL | Status: DC | PRN
  Administered 2012-03-02: 12.5 mg via ORAL
  Filled 2012-02-29: qty 1

## 2012-02-29 MED ORDER — SODIUM CHLORIDE 0.9 % IJ SOLN
INTRAMUSCULAR | Status: DC | PRN
  Administered 2012-02-29: 12:00:00

## 2012-02-29 MED ORDER — MIDAZOLAM HCL 10 MG/2ML IJ SOLN
INTRAMUSCULAR | Status: DC | PRN
  Administered 2012-02-29 (×3): 1 mg via INTRAVENOUS

## 2012-02-29 MED ORDER — DIPHENHYDRAMINE HCL 50 MG/ML IJ SOLN
INTRAMUSCULAR | Status: AC
  Filled 2012-02-29: qty 1

## 2012-02-29 MED ORDER — MIDAZOLAM HCL 10 MG/2ML IJ SOLN
INTRAMUSCULAR | Status: AC
  Filled 2012-02-29: qty 4

## 2012-02-29 MED ORDER — SODIUM CHLORIDE 0.9 % IV SOLN
INTRAVENOUS | Status: DC
  Administered 2012-02-29: 19:00:00 via INTRAVENOUS

## 2012-02-29 NOTE — Interval H&P Note (Signed)
History and Physical Interval Note:  02/29/2012 11:19 AM  Andre Mueller  has presented today for surgery, with the diagnosis of OBSTIPATION  The various methods of treatment have been discussed with the patient and family. After consideration of risks, benefits and other options for treatment, the patient has consented to  Procedure(s) (LRB): COLONOSCOPY (N/A) as a surgical intervention .  [name] history has been reviewed, patient examined, no change in status, stable for surgery.  I have reviewed the patients' chart and labs.  Questions were answered to the patient's satisfaction.     Lina Sar

## 2012-02-29 NOTE — Progress Notes (Signed)
Pt dranked about half of magnesium citrate , pt felt somewhat nauseated and was not able to finish entirely. Pt had 2 loose, watery stools. Tap water enema given this am.  Pt was able to tolerate tap water enema.  After enema given, pt had 3 large, brown watery stools.  Will continue to monitor. Newman Nip Fenton

## 2012-02-29 NOTE — Op Note (Signed)
[facility] [address] [place], [place]  [address]  COLONOSCOPY PROCEDURE REPORT  PATIENT:  Andre Mueller, Andre Mueller  MR#:  [id number] BIRTHDATE:  06-03-1921, 89 yrs. old  GENDER:  male ENDOSCOPIST:  Hedwig Morton. Juanda Chance, MD REF. BY:  Renford Dills, M.D. PROCEDURE DATE:  02/29/2012 PROCEDURE:  Colonoscopy with snare polypectomy ASA CLASS:  Class III INDICATIONS:  constipation, Abdominal pain, change in bowel habits never had a colonoscopy MEDICATIONS:   These medications were titrated to patient response per physician's verbal order, Versed 3 mg, Fentanyl 50 mcg  DESCRIPTION OF PROCEDURE:   After the risks and benefits and of the procedure were explained, informed consent was obtained. Digital rectal exam was performed and revealed no rectal masses. The Pentax Ped Colon [id number] endoscope was introduced through the anus and advanced to the cecum, which was identified by both the appendix and ileocecal valve.  The quality of the prep was good, using Fleets Enemas.  The instrument was then slowly withdrawn as the colon was fully examined. <<PROCEDUREIMAGES>>  FINDINGS:  Two polyps were found in the sigmoid colon. 2 large mpedunculated polyps at 30 and 40 cm, 30 mm and 15 mm submucosal injection Polyps were snared without cautery. Retrieval was successful (see image2, image4, image13, image14, image16, and image17). snare polyp stalk injected with 3 cc's of Epinephrine prior to the removal of the larger polyp  Severe diverticulosis was found in the sigmoid colon (see image1, image14, and image5). lumen narrowed down  This was otherwise a normal examination of the colon. wide open ileocecal valve, able to enter TI With standard forceps, biopsy was obtained and sent to pathology (see image6, image7, image9, image10, image11, and image12). Bx normal appearing TI  Internal Hemorrhoids were found (see image21, image20, and image19).   Retroflexed views in the rectum revealed no  abnormalities.    The scope was then withdrawn from the patient and the procedure completed.  COMPLICATIONS:  None ENDOSCOPIC IMPRESSION: 1) Two polyps in the sigmoid colon 2) Severe diverticulosis in the sigmoid colon 3) Otherwise normal examination 4) Internal hemorrhoids 2 large pedunculated polyps with ulcerated surface and fresh blood, removed with snare RECOMMENDATIONS: 1) Await pathology results bowl regimen of Miralax, fiber, Dulcolox tabs advance diet Anusol HC supp  REPEAT EXAM:  In 0 year(s) for.  no recall due to age  ______________________________ Hedwig Morton. Juanda Chance, MD  CC:  Christiana Fuchs MD  n. Rosalie DoctorHedwig Morton. Delance Weide at 02/29/2012 12:17 PM  [name], [phone]

## 2012-02-29 NOTE — Progress Notes (Signed)
   CARE MANAGEMENT NOTE 02/29/2012  Patient:  Andre Mueller, Andre Mueller   Account Number:  [id number]  Date Initiated:  02/29/2012  Documentation initiated by:  Lanier Clam  Subjective/Objective Assessment:   ADMITTED W/CONSTIPATION.     Action/Plan:   FROM HOME W/SPOUSE   Anticipated DC Date:  03/01/2012   Anticipated DC Plan:  HOME/SELF CARE         Choice offered to / List presented to:             Status of service:  In process, will continue to follow Medicare Important Message given?   (If response is "NO", the following Medicare IM given date fields will be blank) Date Medicare IM given:   Date Additional Medicare IM given:    Discharge Disposition:    Per UR Regulation:  Reviewed for med. necessity/level of care/duration of stay  If discussed at Long Length of Stay Meetings, dates discussed:    Comments:  02/29/12 [facility] RN,BSN NCM [phone]

## 2012-02-29 NOTE — Progress Notes (Signed)
Nursing staff concerned for pt's safety at home d/t his weakness today. Pt requires moderate assistance to get OOB which is a change from his baseline. Per MD pt walked into hospital yesterday. Spoke with pt, pt's wife and son and they are agreeable to staying overnight and having a PT eval tomorrow to assess his strength and safety. Also spoke with MD and they are agreeable as well to keep pt overnight and have a PT eval completed tomorrow. Will continue to monitor pt.   Arta Bruce [facility] 5:52 PM 02/29/2012

## 2012-03-01 DIAGNOSIS — Z9889 Other specified postprocedural states: Secondary | ICD-10-CM

## 2012-03-01 DIAGNOSIS — I2510 Atherosclerotic heart disease of native coronary artery without angina pectoris: Secondary | ICD-10-CM

## 2012-03-01 MED ORDER — POLYETHYLENE GLYCOL 3350 17 G PO PACK
17.0000 g | PACK | Freq: Two times a day (BID) | ORAL | Status: DC
  Administered 2012-03-01 – 2012-03-02 (×2): 17 g via ORAL
  Filled 2012-03-01 (×4): qty 1

## 2012-03-01 MED ORDER — DOCUSATE SODIUM 100 MG PO CAPS
100.0000 mg | ORAL_CAPSULE | Freq: Two times a day (BID) | ORAL | Status: DC
  Administered 2012-03-01 – 2012-03-02 (×3): 100 mg via ORAL
  Filled 2012-03-01 (×4): qty 1

## 2012-03-01 NOTE — Progress Notes (Signed)
Pt and his son feel like he needs another day to get stronger. PT evaluated and no further exercise planned. Will D/C tomorrow

## 2012-03-01 NOTE — Progress Notes (Signed)
I have reviewed the above note, examined the patient and agree with plan of treatment.Discharge today, await polyp pathology. Miralax, Dulcolox,Metamucil

## 2012-03-01 NOTE — Evaluation (Signed)
Physical Therapy Evaluation/ 1x EVAL  Patient Details Name: Andre Mueller MRN: [id number] DOB: 21-Jul-1921 Today's Date: 03/01/2012 Time: 4098-1191 PT Time Calculation (min): 28 min  PT Assessment / Plan / Recommendation Clinical Impression  Pt with some generalized weakness , however feel pt at baseline and could return to his independent apartment.  REcommend nursing staff to conintue to ambulate pt in halls today with RW with supervison to continue to get pt's strength back to his baseline.  No HHPT recommended at this time.     PT Assessment  Patent does not need any further PT services    Follow Up Recommendations  No PT follow up    Equipment Recommendations  None recommended by PT    Frequency      Precautions / Restrictions Restrictions Weight Bearing Restrictions: No   Pertinent Vitals/Pain       Mobility  Bed Mobility Bed Mobility: Rolling Right;Right Sidelying to Sit Rolling Right: 5: Supervision Right Sidelying to Sit: HOB elevated;5: Supervision Transfers Transfers: Sit to Stand;Stand to Sit Sit to Stand: 5: Supervision Stand to Sit: 5: Supervision Details for Transfer Assistance: pt tolerated well Ambulation/Gait Ambulation/Gait Assistance: 4: Min guard Ambulation Distance (Feet): 150 Feet Assistive device: Rolling walker Ambulation/Gait Assistance Details: pt with step thru normal pattern, good pace with RW, no SOB or unsteady.  Practiced walking unassisted in room for short distance with no Assistance as he does at home, and able to retrieve item off floor as well and with turns.      Exercises     PT Goals    Visit Information  Last PT Received On: 03/01/12 Assistance Needed: +1    Subjective Data  Subjective: Pt stating he is feeling bettter than yesterdaya nd would be fine going home this evening or at least by tomorrow.  Feels confident to returning to indepenedent apartment as before.    Prior Functioning  Home Living Lives With:  Spouse Available Help at Discharge: Family Type of Home: Independent living facility Home Access: Elevator Home Layout: One level Bathroom Shower/Tub: Health visitor: Handicapped height Bathroom Accessibility: Yes How Accessible: Accessible via walker Home Adaptive Equipment: Grab bars around toilet;Grab bars in shower;Walker - rolling Prior Function Level of Independence: Independent with assistive device(s) (uses Rolator (4 wheeled RW) in halls and to dining rm.) Driving: No    Cognition  Overall Cognitive Status: Appears within functional limits for tasks assessed/performed Arousal/Alertness: Awake/alert Orientation Level: Oriented X4 / Intact Behavior During Session: WFL for tasks performed    Extremity/Trunk Assessment Right Lower Extremity Assessment RLE ROM/Strength/Tone: Within functional levels RLE Sensation: WFL - Light Touch RLE Coordination: WFL - gross/fine motor Left Lower Extremity Assessment LLE ROM/Strength/Tone: WFL for tasks assessed LLE Sensation: WFL - Light Touch LLE Coordination: WFL - gross/fine motor   Balance    End of Session PT - End of Session Equipment Utilized During Treatment: Gait belt Activity Tolerance: Patient tolerated treatment well Patient left: in bed;with call bell/phone within reach Nurse Communication: Mobility status (spoke iwth nursing staff to provide supervison and walk with)   Bracy Pepper 03/01/2012, 12:05 PM  Marella Bile, PT Pager: [phone] 03/01/2012

## 2012-03-01 NOTE — Progress Notes (Signed)
[facility] Progress Note  SUBJECTIVE: feels okay today. Wants to go home  OBJECTIVE:  Vital signs in last 24 hours: Temp:  [97.7 F (36.5 C)-98 F (36.7 C)] 97.7 F (36.5 C) (05/04 0521) Pulse Rate:  [71-85] 71  (05/04 0521) Resp:  [11-25] 18  (05/04 0521) BP: (101-161)/(41-87) 149/72 mmHg (05/04 0521) SpO2:  [93 %-97 %] 93 % (05/04 0521) Weight:  [170 lb (77.111 kg)] 170 lb (77.111 kg) (05/03 1107) Last BM Date: 02/29/12 General:    Pleasant white male in NAD Heart:  Regular rate and rhythm Lungs: Respirations even and unlabored Abdomen:  Soft, nontender and nondistended. Normal bowel sounds. Extremities:  Without edema. Neurologic:  Alert and oriented,  grossly normal neurologically. Psych:  Cooperative. Normal mood and affect.   ASSESSMENT / PLAN:  1. Obstipation, resolved with gastrograffin enema, tap water enema. No masses / obstructions on colonoscopy yesterday. Cause of acute obstipation not clear. Will check to see if he has had TSH. Patient will need aggressive bowel regimen at home. We will see him in the office next week to make sure he isn't having recurrent problems. I spoke with son, patient should be on a high fiber diet.   2. Unsteady gait. Patient was ready for discharge yesterday evening but nurse called concerned about weakness. Patient lives with elderly wife at home. Physical therapy will evaluate him this am. If stable then will discharge him home.  3. Colon polyps (large) on colonoscopy yesterday. Path pending.      LOS: 2 days   Willette Cluster  03/01/2012, 9:24 AM

## 2012-03-02 MED ORDER — OMEPRAZOLE 20 MG PO CPDR: 20.0000 mg | DELAYED_RELEASE_CAPSULE | Freq: Every day | ORAL | Status: DC

## 2012-03-02 MED ORDER — SIMVASTATIN 10 MG PO TABS: 10.0000 mg | ORAL_TABLET | Freq: Every day | ORAL | Status: DC

## 2012-03-02 NOTE — Discharge Summary (Signed)
[facility] Discharge Summary  Name: Andre Mueller MRN: [id number] DOB: Dec 16, 1920 78 y.o. PCP:  Katy Apo, MD, MD  Date of Admission: 02/28/2012 11:17 AM Date of Discharge: 03/02/2012 Attending Physician: Lina Sar, MD  Discharge Diagnosis: 1. Obstipation, resolved 2. Unsteady Gait, resolved  Consultations:  none  Procedures Performed:  Ct Abdomen Pelvis W Contrast  02/18/2012  *RADIOLOGY REPORT*  Clinical Data: Constipation with upper abdominal pain.  CT ABDOMEN AND PELVIS WITH CONTRAST  Technique:  Multidetector CT imaging of the abdomen and pelvis was performed following the standard protocol during bolus administration of intravenous contrast.  Contrast: OMNIPAQUE IOHEXOL 300 MG/ML  SOLN  Comparison: CT chest 11/21/2010.  Findings: Lung bases show no acute findings.  There is a calcified pleural plaque along the right hemidiaphragm.  Tiny nodule in the posterior right lower lobe (image 25) is unchanged from 11/21/2010 and is therefore considered benign.  Heart is at the upper limits of normal in size.  No pericardial or pleural effusion. Small hiatal hernia with associated surgical clips.  Scattered tiny low density lesions in the liver are sub centimeter in size, as before.  Mild intrahepatic biliary duct dilatation, as before.  Pneumobilia has resolved.  Gallbladder is poorly visualized and may be surgically absent.  Adrenal glands are unremarkable.  A 6 mm low attenuation lesion in the interpolar right kidney is too small to definitively characterize but statistically is likely a cyst.  Left kidney, spleen, pancreas, stomach and proximal small bowel are unremarkable.  Unobstructed small bowel is seen within a right inguinal hernia.  Colon is unremarkable.  Small ventral hernia contains fat and vessels.  Small left inguinal hernia contains fat.  Atherosclerotic calcification of the arterial vasculature without abdominal aortic aneurysm.  No pathologically enlarged lymph  nodes.  No free fluid.  Prostate is mildly enlarged. No worrisome lytic or sclerotic lesions.  IMPRESSION:  1.  Right inguinal hernia contains unobstructed small bowel. 2.  Small ventral and left inguinal hernias contain fat. 3.  No acute findings.  Original Report Authenticated By: Reyes Ivan, M.D.   Dg Abd 2 Views  02/29/2012  *RADIOLOGY REPORT*  Clinical Data: Follow-up for history of a fecal impaction. Therapeutic water soluble enema on 02/28/2012.  ABDOMEN - 2 VIEW  Comparison: None.  Findings: Supine and left lateral decubitus views of the abdomen demonstrates gas, stool and some residual contrast material throughout the colon extending to the level of the distal rectum. No pathologic distension of small bowel was noted.  There are a few air-fluid levels noted on the left lateral decubitus view, however, these appear to be within the ascending colon and hepatic flexure of the colon.  No gross evidence of pneumoperitoneum.  IMPRESSION: 1.  Nonobstructive bowel gas pattern. 2.  No evidence of residual fecal impaction.  Original Report Authenticated By: Florencia Reasons, M.D.   Dg Abd Acute W/chest  02/25/2012  *RADIOLOGY REPORT*  Clinical Data: Abdominal pain, constipation  ACUTE ABDOMEN SERIES (ABDOMEN 2 VIEW & CHEST 1 VIEW)  Comparison: 02/18/2012  Findings: Cardiomediastinal silhouette is stable.  No acute infiltrate or pulmonary edema.  Thoracic dextroscoliosis again noted.  Status post CABG. There is nonspecific nonobstructive bowel gas pattern.  Significant stool noted hepatic flexure of the colon which is distended measures about 10.5 cm in diameter.  Moderate stool noted in the distal colon and rectum.  No free abdominal air. Mild levoscoliosis lumbar spine.  IMPRESSION:  1.  No acute disease within chest. 2.  Significant  stool noted right colon and hepatic flexure of the colon.  Some stool noted in the distal colon and rectum.  No free abdominal air.  Original Report Authenticated By:  Natasha Mead, M.D.   Dg Abd Acute W/chest  02/18/2012  *RADIOLOGY REPORT*  Clinical Data: Constipation and upper abdominal pain.  ACUTE ABDOMEN SERIES (ABDOMEN 2 VIEW & CHEST 1 VIEW)  Comparison: 02/14/2012  Findings: Stable postoperative changes in the mediastinum.  Cardiac enlargement with normal pulmonary vascularity.  Probable atelectasis in the lung bases.  No pneumothorax.  No blunting of costophrenic angles.  Calcification of the aorta.  Mild gaseous distension of the colon with air-fluid levels consistent with liquid stool.  Changes are most consistent with ileus. Low colonic obstruction is not excluded.  Stable appearance since previous study.  No free intra-abdominal air.  Degenerative changes in the lumbar spine and hips.  IMPRESSION: Mild gaseous distension of the colon with liquid stool most consistent with ileus.  Stable appearance since previous study. Atelectasis in the lung bases.  Original Report Authenticated By: Marlon Pel, M.D.   Dg Abd Acute W/chest  02/14/2012  *RADIOLOGY REPORT*  Clinical Data: Diffuse abdominal pain.  ACUTE ABDOMEN SERIES (ABDOMEN 2 VIEW & CHEST 1 VIEW)  Comparison: 11/21/2010.  Findings: Frontal view of the chest shows midline trachea and stable heart size.  Thoracic aorta is calcified.  Mild scarring at the lung bases.  No pleural fluid.  Two views of the abdomen show gas and stool in mildly distended colon.  No definite small bowel dilatation.  IMPRESSION: Question mild colonic ileus with constipation.  Original Report Authenticated By: Reyes Ivan, M.D.   Dg Colon Therapeutic W/cm  02/28/2012  *RADIOLOGY REPORT*  Clinical Data: Severe constipation.  Abdominal pain.  WATER SOLUBLE CONTRAST ENEMA  Technique:  Initial scout AP supine abdominal image was obtained. Water soluble contrast was introduced into the colon in a retrograde fashion and refluxed from the rectum to the cecum.  Spot images of the colon followed by overhead radiographs were obtained.   Fluoroscopy time: 1.5 minutes.  Comparison:  None.  Findings:  The scout radiograph shows mildly distended colon with a significant amount of stool.  Contrast enema using dilated water-soluble gastrographin contrast shows retrograde filling of the entire colon, with reflux of contrast into distal small bowel.  There is no evidence of colonic obstruction.  No annular constricting lesions are seen.  Other colonic masses cannot be excluded on this exam due to large amount of colonic stool.  IMPRESSION: Successful therapeutic water-soluble contrast enema.  No evidence of colonic obstruction.  Original Report Authenticated By: Danae Orleans, M.D.    GI Procedures: Colonoscopy by Dr. Juanda Chance  History/Physical Exam:  See Admission H&P  Admission HPI: Roldan Laforest is a 78 y.o. male Mr. Fedie is an 78 yo male recently seen by Dr. Rhea Belton for a 2-3 week history of severe constipation associated with lower abdominal cramping. Prior to 3 weeks ago he was having regular daily bowel movements. He has had some nausea but no vomiting. He also reports a 10 pound weight loss and low back pain. Patient has been evaluated in the ED several times recently for symptoms. Abdominal series have suggested colonic ileus and another time showed significant stool in right colon and hepatic flexure. A contrast CTscan basically unrevealing though he did had a right inguinal hernia containing unobstructed small bowel. Patient has had ineffective responses to Miralax, colace and magnesium citrate. He is just passing a lot  of gas, little stool. He has never had a colonoscopy. 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 He also reports a history of a bowel resection, ? small bowel. At time of patient's visit with Dr. Rhea Belton patient's Miralax was increased to BID. There was a discussion of colonoscopy but we were concerned about proceeding given patient's  significant back pain. We suggested xrays of back be done to rule out compression fracture. In the interim patient called the office with progressive abdominal pain and constipation. He is being admitted for further evaluation and treatment   Hospital Course by problem list:  1. Obstipation. Patient had a good response to a gastrograffin enema done on day of admission. Later that evening he got a tap water enema with plans to do a colonoscopy the following morning. Patient didn't receive oral prep secondary to nausea and what sounds like an esophageal rupture many years ago secondary ot nausea and vomiting related to a bowel prep. Colonoscopy was negative for any obstructing masses. Patient will need aggressive bowel regimen at home. He will follow up with me next week to make sure he isn't having recurrent problems.   2. Unsteady gait. Following the procedure the patient wanted to go home but nursing staff had some concerns about his unsteady gait. Physical therapy evaluated the patient and felt he was at baseline with ability to get around with a walker. [name] son had continued concerns about discharge so patient was observed in the hospital an additional day. On the day of discharge patient felt okay. He had a small bowel movement.    Discharge Vitals:  BP 155/76  Pulse 69  Temp(Src) 97.6 F (36.4 C) (Oral)  Resp 18  Ht 5\' 10"  (1.778 m)  Wt 170 lb (77.111 kg)  BMI 24.39 kg/m2  SpO2 94%  Discharge Labs: No results found for this or any previous visit (from the past 24 hour(s)).  Disposition and follow-up:   Mr.Hy Blades was discharged from [facility] in stable condition.    Follow-up Appointments: Discharge Orders    Future Appointments: Provider: Department: Dept Phone: Center:   08/21/2012 9:30 AM Newt Lukes, MD [facility] [phone] [facility]     Future Orders Please Complete By Expires   Discontinue IV         Discharge Medications: Medication List  As of  03/02/2012  1:24 PM   TAKE these medications         amLODipine-benazepril 5-40 MG per capsule   Commonly known as: LOTREL   Take 1 capsule by mouth daily.      ammonium lactate 12 % cream   Commonly known as: AMLACTIN   Apply 1 Bottle topically as needed. Apply to cheek      aspirin EC 81 MG tablet   Take 81 mg by mouth daily.      bisoprolol-hydrochlorothiazide 5-6.25 MG per tablet   Commonly known as: ZIAC   Take 1 tablet by mouth daily.      docusate sodium 100 MG capsule   Commonly known as: COLACE   Take 1 capsule (100 mg total) by mouth 2 (two) times daily.      hydrocortisone 2.5 % rectal cream   Commonly known as: ANUSOL-HC   Place rectally 2 (two) times daily.      LORazepam 0.5 MG tablet   Commonly known as: ATIVAN   Take 0.5 mg by mouth  at bedtime.      mulitivitamin with minerals Tabs   Take 1 tablet by mouth daily.      omeprazole 20 MG capsule   Commonly known as: PRILOSEC   Take 1 capsule (20 mg total) by mouth daily.      polyethylene glycol powder powder   Commonly known as: GLYCOLAX/MIRALAX   Take 17 g by mouth 2 (two) times daily.      promethazine 25 MG tablet   Commonly known as: PHENERGAN   Take 25 mg by mouth every 6 (six) hours as needed.      simvastatin 10 MG tablet   Commonly known as: ZOCOR   Take 1 tablet (10 mg total) by mouth at bedtime.      SYSTANE BALANCE 0.6 % Soln   Generic drug: Propylene Glycol   Place 1-2 drops into both eyes 2 (two) times daily.      traMADol 50 MG tablet   Commonly known as: ULTRAM   Take 1 tablet (50 mg total) by mouth every 8 (eight) hours as needed for pain.      zolpidem 10 MG tablet   Commonly known as: AMBIEN   Take 10 mg by mouth at bedtime as needed. For sleep            Signed: Willette Cluster 03/02/2012, 1:24 PM

## 2012-03-02 NOTE — Progress Notes (Signed)
Patient discharged home with family, discharge instructions given and explained to patient and family and they verbalized understanding, patient denies any pain/distress; skin intact no wound. Accompanied home by family and transported to the car by staff.

## 2012-03-02 NOTE — Progress Notes (Signed)
[facility] Progress Note  SUBJECTIVE: feels okay, had a small BM this am.  OBJECTIVE:  Vital signs in last 24 hours: Temp:  [97.6 F (36.4 C)-98.4 F (36.9 C)] 97.6 F (36.4 C) (05/05 0530) Pulse Rate:  [69-72] 69  (05/05 0530) Resp:  [16-18] 18  (05/05 0530) BP: (128-165)/(65-75) 149/71 mmHg (05/05 0530) SpO2:  [93 %-94 %] 94 % (05/05 0530) Last BM Date: 02/29/12 General:    Pleasant white male in NAD Heart:  Regular rate and rhythm Lungs: Respirations even and unlabored, lungs CTA bilaterally Abdomen:  Soft, nontender and nondistended. Normal bowel sounds. Extremities:  Without edema. Neurologic:  Alert and oriented,  grossly normal neurologically. Psych:  Cooperative. Normal mood and affect.    ASSESSMENT / PLAN:  1. Obstipation, resolved. No masses / obstructions on colonoscopy. Cause of acute obstipation not clear. TSH was normal. Patient will need aggressive bowel regimen at home. We will see him in the office next week to make sure he isn't having recurrent problems. I spoke with son, patient should be on a high fiber diet.  2. Unsteady gait. PT evaluated him yesterday. No need for further evaluation from their standpoint. Patient ambulated in halls yesterday with a walker.      LOS: 3 days   Willette Cluster  03/02/2012, 9:30 AM

## 2012-03-03 ENCOUNTER — Encounter (HOSPITAL_COMMUNITY): Payer: Self-pay | Admitting: Internal Medicine

## 2012-03-03 ENCOUNTER — Telehealth: Payer: Self-pay | Admitting: *Deleted

## 2012-03-03 NOTE — Telephone Encounter (Signed)
Pt reports he was discharged yesterday and Willette Cluster ,NP states he needs an appt. Gunnar Fusi then came back and pt is scheduled to see her on 03/06/12 at 09:30am; pt stated understanding.

## 2012-03-04 ENCOUNTER — Telehealth: Payer: Self-pay | Admitting: Nurse Practitioner

## 2012-03-04 ENCOUNTER — Encounter: Payer: Self-pay | Admitting: Internal Medicine

## 2012-03-04 ENCOUNTER — Ambulatory Visit (INDEPENDENT_AMBULATORY_CARE_PROVIDER_SITE_OTHER)
Admission: RE | Admit: 2012-03-04 | Discharge: 2012-03-04 | Disposition: A | Discharge status: Home / Self Care | Payer: Medicare Other | Source: Ambulatory Visit | Attending: Physician Assistant | Admitting: Physician Assistant

## 2012-03-04 ENCOUNTER — Encounter: Payer: Self-pay | Admitting: Physician Assistant

## 2012-03-04 ENCOUNTER — Ambulatory Visit (INDEPENDENT_AMBULATORY_CARE_PROVIDER_SITE_OTHER): Payer: Medicare Other | Admitting: Physician Assistant

## 2012-03-04 VITALS — BP 116/66 | HR 88 | Ht 68.0 in | Wt 168.2 lb

## 2012-03-04 DIAGNOSIS — K5904 Chronic idiopathic constipation: Secondary | ICD-10-CM

## 2012-03-04 DIAGNOSIS — R109 Unspecified abdominal pain: Secondary | ICD-10-CM

## 2012-03-04 DIAGNOSIS — K5909 Other constipation: Secondary | ICD-10-CM

## 2012-03-04 DIAGNOSIS — M549 Dorsalgia, unspecified: Secondary | ICD-10-CM

## 2012-03-04 MED ORDER — OXYCODONE-ACETAMINOPHEN 5-325 MG PO TABS: ORAL_TABLET | ORAL | Status: DC

## 2012-03-04 NOTE — Progress Notes (Signed)
Subjective:    Patient ID: Andre Mueller, male    DOB: 06-14-1921, 78 y.o.   MRN: [id number]  Andre Mueller is an 78 year old white male recently known to Dr. Rhea Belton who came in a couple of weeks ago for evaluation of constipation and abdominal pain onset about a month ago. Prior to that apparently he was having regular bowel movements. He had had 4 ER visits prior to seeing Dr.Pyrtle. He was started on MiraLax twice daily and scheduled for colonoscopy. Unfortunately he was unable to do his bowel prep and ultimately required hospital admission tubular purge his bowel. He actually had a Gastrografin barium enema due to fecal impaction and then did have good evacuation of his bowel thereafter. He underwent colonoscopy with Dr. Juanda Chance on life 5/32013  found have 2 polyps in the sigmoid colon which were adenomatous and then severe diverticulosis in the sigmoid colon. He was discharged on Sunday 5/5 and asked to continue MiraLax twice daily.  He comes back in today as his family is quite concerned that he still seems to be having a lot of pain, and his wife feels that he is having abdominal pain the patient states that it is his back but is hurting para Apparently has not had any significant bowel movement since his colonoscopy but has been passing some flatus and very small pieces of stool. He has not had any nausea or vomiting and has no complaints of abdominal distention. He does state that it is very painful to sit on the commode and that he cannot strain due to back pain. He is very uncomfortable and says that he has had constant lower back pain over the past 3 weeks. He is taking tramadol which he says takes the edge off that doesn't help that much. He says he is most comfortable lying flat in bed  His pain is aggravated by any ambulation or  movement. He has not had any previous back problems. He has not had any recent spinal imaging. He does not recall any injury to his back or a fall though he says he feels  that he has to "jar" his back in order to get out of bed. He did have CT scan of the abdomen and pelvis done on 02/18/2012 which showed a right inguinal hernia containing nonobstructed small bowel and a small ventral and left inguinal hernia and no other acute findings. There were no comments other than new worrisome lytic or sclerotic lesions regarding his spine.    Review of Systems  Constitutional: Negative.   HENT: Negative.   Eyes: Negative.   Respiratory: Negative.   Cardiovascular: Negative.   Gastrointestinal: Positive for abdominal pain and constipation.  Genitourinary: Negative.   Musculoskeletal: Positive for back pain and gait problem.  Neurological: Negative.   Hematological: Negative.   Psychiatric/Behavioral: Negative.    Outpatient Prescriptions Prior to Visit  Medication Sig Dispense Refill  . amLODipine-benazepril (LOTREL) 5-40 MG per capsule Take 1 capsule by mouth daily.      Marland Kitchen ammonium lactate (AMLACTIN) 12 % cream Apply 1 Bottle topically as needed. Apply to cheek      . aspirin EC 81 MG tablet Take 81 mg by mouth daily.      . bisoprolol-hydrochlorothiazide (ZIAC) 5-6.25 MG per tablet Take 1 tablet by mouth daily.      Marland Kitchen docusate sodium (COLACE) 100 MG capsule Take 1 capsule (100 mg total) by mouth 2 (two) times daily.  10 capsule  0  . hydrocortisone (ANUSOL-HC) 2.5 %  rectal cream Place rectally 2 (two) times daily.  30 g  1  . LORazepam (ATIVAN) 0.5 MG tablet Take 0.5 mg by mouth at bedtime.      . Multiple Vitamin (MULITIVITAMIN WITH MINERALS) TABS Take 1 tablet by mouth daily.      Marland Kitchen omeprazole (PRILOSEC) 20 MG capsule Take 1 capsule (20 mg total) by mouth daily.  30 capsule  1  . promethazine (PHENERGAN) 25 MG tablet Take 25 mg by mouth every 6 (six) hours as needed.      Marland Kitchen Propylene Glycol (SYSTANE BALANCE) 0.6 % SOLN Place 1-2 drops into both eyes 2 (two) times daily.       . simvastatin (ZOCOR) 10 MG tablet Take 1 tablet (10 mg total) by mouth at bedtime.   30 tablet  1  . traMADol (ULTRAM) 50 MG tablet Take 1 tablet (50 mg total) by mouth every 8 (eight) hours as needed for pain.  30 tablet  0  . zolpidem (AMBIEN) 10 MG tablet Take 10 mg by mouth at bedtime as needed. For sleep      . polyethylene glycol powder (GLYCOLAX/MIRALAX) powder Take 17 g by mouth 2 (two) times daily.  255 g  0   Allergies  Allergen Reactions  . Penicillins Swelling   Patient Active Problem List  Diagnoses  . Constipation  . History of resection of small bowel  . GERD (gastroesophageal reflux disease)  . Hypertension  . Hyperlipidemia  . CAD (coronary artery disease)  . Glaucoma  . Benign neoplasm of colon       Objective:   Physical Exam well-developed white male in no acute distress, ambulates very slowly with a walker, accompanied by his wife and son. Blood pressure 116/66 pulse 88 height 5 foot 8 weight 168. HEENT; nontraumatic normocephalic EOMI PERRLA sclera anicteric, Cardiovascular; regular rate and rhythm with S1-S2 no murmur gallop, Pulmonary; clear bilaterally, Abdomen; soft nondistended bowel sounds are present there is no focal tenderness, Back; he is tender with palpation over the upper lumbar spine he does not have any paraspinous muscle tenderness, Rectal; not done the patient just had colonoscopy 02/29/2012 extremities no clubbing cyanosis or edema, Psych; mood and affect normal and appropriate.        Assessment & Plan:  #71  78 year old male with recent onset of constipation, resolved fecal impaction and negative colonoscopy 02/29/2012 as far as etiology for his constipation. I suspect his constipation is being aggravated by his back pain and am concerned he has either a compression fracture or a herniated Disc. Plan; Long discussion with patient and his family today, there frustrated by the persistence of his symptoms and lack of easy solution. Continue MiraLax 17 g in 8 ounces of water 2 doses daily, add Dulcolax one tablet at bedtime Will  give trial of Linzess 1 by mouth daily, 290 mcg tablets were given today as samples and we may taper down this regimen if he does have good response. Add Percocet 5/325 one every 6 hours as needed for pain in place of tramadol Schedule for CT scan of the lower thoracic and lumbar spine. Family  was advised they may need referral to an orthopedist pending results. We will check on his progress in 48 hours.

## 2012-03-04 NOTE — Progress Notes (Signed)
Agree with initial assessment and plans 

## 2012-03-04 NOTE — Telephone Encounter (Signed)
[name] wife called back and states the patient is in a lot more pain than he is telling. States the BID Miralax is not working. She states he is in pain from constipation. Per Mike Gip, PA patient can be seen today.Scheduled patient for 2:30 PM today. Patient to have 2 view abd film also.

## 2012-03-04 NOTE — Patient Instructions (Signed)
Take 2 doses of Miralax, EVERY day in the morning. Take 1 dulcolax tablet at bedtime nightly. We gave you samples of Linzess.  We  have given you a prescription or Percocet to take to the pharmacy to be filled. Stop the Tramadol.  We scheduled the CT scan of the back for tomorrow 03-05-2012. Arrive at 1:15 PM.   You have been scheduled for a CT scan of the abdomen and pelvis at [facility] ([address]---this is in the same building as [facility]).   You are scheduled on 03-05-2012 at 1:30 PM . You should arrive at 1:15 PM to your appointment time for registration. Please follow the written instructions below on the day of your exam:  WARNING: IF YOU ARE ALLERGIC TO IODINE/X-RAY DYE, PLEASE NOTIFY RADIOLOGY IMMEDIATELY AT [phone]! YOU WILL BE GIVEN A 13 HOUR PREMEDICATION PREP.    You may take any medications as prescribed with a small amount of water except for the following: Metformin, Glucophage, Glucovance, Avandamet, Riomet, Fortamet, Actoplus Met, Janumet, Glumetza or Metaglip. The above medications must be held the day of the exam AND 48 hours after the exam.  The purpose of you drinking the oral contrast is to aid in the visualization of your intestinal tract. The contrast solution may cause some diarrhea. Before your exam is started, you will be given a small amount of fluid to drink. Depending on your individual set of symptoms, you may also receive an intravenous injection of x-ray contrast/dye. Plan on being at [facility] for 30 minutes or long, depending on the type of exam you are having performed.  If you have any questions regarding your exam or if you need to reschedule, you may call the CT department at [phone] between the hours of 8:00 am and 5:00 pm, Monday-Friday.  ________________________________________________________________________

## 2012-03-04 NOTE — Telephone Encounter (Signed)
Spoke with patient and he states he had a small bowel movement after taking Miralax once yesterday but does not feel like he has had a good bowel movement. Patient will take another dose of Miralax this afternoon if he has not had a good bowel movement today. Patient understands he may take the Miralax BID if needed.

## 2012-03-04 NOTE — Telephone Encounter (Signed)
Patient is not constipated, he just feels like he is. He had a colon several days ago and was clean, he has not eaten much  Since then. I believe he if bowl focused.

## 2012-03-05 ENCOUNTER — Ambulatory Visit (INDEPENDENT_AMBULATORY_CARE_PROVIDER_SITE_OTHER)
Admission: RE | Admit: 2012-03-05 | Discharge: 2012-03-05 | Disposition: A | Discharge status: Home / Self Care | Payer: Medicare Other | Source: Ambulatory Visit | Attending: Physician Assistant | Admitting: Physician Assistant

## 2012-03-05 ENCOUNTER — Other Ambulatory Visit: Payer: Medicare Other

## 2012-03-05 DIAGNOSIS — M549 Dorsalgia, unspecified: Secondary | ICD-10-CM

## 2012-03-06 ENCOUNTER — Ambulatory Visit: Payer: Medicare Other | Admitting: Nurse Practitioner

## 2012-03-07 ENCOUNTER — Telehealth: Payer: Self-pay | Admitting: Internal Medicine

## 2012-03-07 DIAGNOSIS — R112 Nausea with vomiting, unspecified: Secondary | ICD-10-CM

## 2012-03-07 MED ORDER — ONDANSETRON 4 MG PO TBDP: 4.0000 mg | ORAL_TABLET | Freq: Three times a day (TID) | ORAL | Status: DC | PRN

## 2012-03-07 NOTE — Telephone Encounter (Signed)
I do not want to use ativan subling, esp in light of narcotic pain medication use. Let's try zofran ODT 4 mg q6h PRN nausea/vomiting Take oxycodone with food. If not better by mid-day he may need to be re-admitted, and if so, to the hospitalist service.

## 2012-03-07 NOTE — Telephone Encounter (Signed)
Informed pt of his appt with Dr Phoebe Perch with address and time. He states he's ok, no pain and will eat soon. Cautioned pt on fall; pt stated understanding.

## 2012-03-07 NOTE — Telephone Encounter (Signed)
Pt's wife called to report pt has been throwing up since about 4am this; emesis is green mucus per wife. He is so weak he can't stand up and she reports she's pushing him around on his walker. Spoke with pt who stated he just took a Percocet and was pain free right now and denied nausea at this time. He did report he threw up a phenergan when he tried to take it. I asked if he was taking the Percocet on an empty stomach and he is. Advised him until I called him back to put an Ativan under his tongue to dissolve for recurring nausea; he stated understanding. Please advise. Thanks.

## 2012-03-07 NOTE — Telephone Encounter (Signed)
lmom for pt to call back. [facility] has an appt for him on Monday, Mar 10, 2012 at 1:45pm with Dr Phoebe Perch.

## 2012-03-07 NOTE — Telephone Encounter (Signed)
Phoned pt to check on his condition and he states he feels like a new man. He ate and is pain free and hasn't had to take anything for nausea. Reminded him oh his appt 03/10/12 for his back. Also reminded him to eat something when he takes his percocet; pt stated understanding.

## 2012-03-07 NOTE — Telephone Encounter (Signed)
Spoke with pt to instruct him not to take the Ativan for nausea! Dr Rhea Belton would like to order Zofran ODT d/t the ativan and percocet may be too sedating for pt. Informed pt he can the ODT on his tongue and let it dissolve rather the phenergan which he can throw up. Also, please eat some saltines when taking the Percocet so his stomach won't be empty. Pt reports he has been doing the regime Amy ordered; Miralax and dulcolax and then the Linzess. Pt does not feel constipation is his problem at the moment; he did try a Linzess with the other meds and had a BM. Reinforced that the Percocet can cause constipation, so continue with Amy's recommendations. Told him I'm still working on a referral for his disk problem.

## 2012-03-10 ENCOUNTER — Other Ambulatory Visit: Payer: Self-pay | Admitting: Neurosurgery

## 2012-03-10 ENCOUNTER — Other Ambulatory Visit: Payer: Self-pay | Admitting: Gastroenterology

## 2012-03-11 ENCOUNTER — Encounter (HOSPITAL_COMMUNITY)
Admission: RE | Admit: 2012-03-11 | Discharge: 2012-03-11 | Disposition: A | Discharge status: Home / Self Care | Payer: Medicare Other | Source: Ambulatory Visit | Attending: Anesthesiology | Admitting: Anesthesiology

## 2012-03-11 ENCOUNTER — Encounter (HOSPITAL_COMMUNITY): Payer: Self-pay

## 2012-03-11 ENCOUNTER — Encounter (HOSPITAL_COMMUNITY)
Admission: RE | Admit: 2012-03-11 | Discharge: 2012-03-11 | Disposition: A | Discharge status: Home / Self Care | Payer: Medicare Other | Source: Ambulatory Visit | Attending: Neurosurgery | Admitting: Neurosurgery

## 2012-03-11 ENCOUNTER — Encounter (HOSPITAL_COMMUNITY): Payer: Self-pay | Admitting: Respiratory Therapy

## 2012-03-11 LAB — SURGICAL PCR SCREEN
MRSA, PCR: NEGATIVE
Staphylococcus aureus: NEGATIVE

## 2012-03-11 LAB — BASIC METABOLIC PANEL
CO2: 20 mEq/L (ref 19–32)
Calcium: 9.6 mg/dL (ref 8.4–10.5)
GFR calc Af Amer: >90 mL/min (ref >90)
Sodium: 133 mEq/L — ABNORMAL LOW (ref 135–145)

## 2012-03-11 LAB — URINALYSIS, ROUTINE W REFLEX MICROSCOPIC
Glucose, UA: NEGATIVE mg/dL
Hgb urine dipstick: NEGATIVE
Leukocytes, UA: NEGATIVE
Specific Gravity, Urine: 1.021 (ref 1.005–1.030)

## 2012-03-11 LAB — DIFFERENTIAL
Basophils Absolute: 0 10*3/uL (ref 0.0–0.1)
Basophils Relative: 0 % (ref 0–1)
Eosinophils Relative: 1 % (ref 0–5)
Lymphocytes Relative: 20 % (ref 12–46)
Neutro Abs: 5.5 10*3/uL (ref 1.7–7.7)

## 2012-03-11 LAB — CBC
MCH: 34.4 pg — ABNORMAL HIGH (ref 26.0–34.0)
MCHC: 36.7 g/dL — ABNORMAL HIGH (ref 30.0–36.0)
MCV: 93.6 fL (ref 78.0–100.0)
Platelets: 213 10*3/uL (ref 150–400)
RBC: 4.22 MIL/uL (ref 4.22–5.81)
RDW: 13.3 % (ref 11.5–15.5)

## 2012-03-11 LAB — PROTIME-INR: Prothrombin Time: 13.9 seconds (ref 11.6–15.2)

## 2012-03-11 LAB — APTT: aPTT: 32 seconds (ref 24–37)

## 2012-03-11 NOTE — Progress Notes (Signed)
Complete not in med rec. Tech notified

## 2012-03-11 NOTE — Pre-Procedure Instructions (Signed)
20 Andre Mueller  03/11/2012   Your procedure is scheduled on:  03/13/12  Report to [facility] at 745 AM.  Call this number if you have problems the morning of surgery: [phone]   Remember:   Do not eat food:After Midnight.  May have clear liquids: up to 4 Hours before arrival.  Clear liquids include soda, tea, black coffee, apple or grape juice, broth.  Take these medicines the morning of surgery with A SIP OF WATER: lotrel,ativan,prilosec, zofran,oxycodone,ultram   Do not wear jewelry, make-up or nail polish.  Do not wear lotions, powders, or perfumes. You may wear deodorant.  Do not shave 48 hours prior to surgery. Men may shave face and neck.  Do not bring valuables to the hospital.  Contacts, dentures or bridgework may not be worn into surgery.  Leave suitcase in the car. After surgery it may be brought to your room.  For patients admitted to the hospital, checkout time is 11:00 AM the day of discharge.   Patients discharged the day of surgery will not be allowed to drive home.  Name and phone number of your driver: family  Special Instructions: CHG Shower Use Special Wash: 1/2 bottle night before surgery and 1/2 bottle morning of surgery.   Please read over the following fact sheets that you were given: Pain Booklet, Coughing and Deep Breathing, MRSA Information and Surgical Site Infection Prevention

## 2012-03-11 NOTE — Progress Notes (Signed)
Pt does not have cardiac md.  He stated dr Rhea Belton has not done any ekg or heart test.

## 2012-03-13 ENCOUNTER — Encounter (HOSPITAL_COMMUNITY)
Admission: RE | Disposition: A | Discharge status: Home / Self Care | Payer: Self-pay | Source: Ambulatory Visit | Attending: Neurosurgery

## 2012-03-13 ENCOUNTER — Ambulatory Visit (HOSPITAL_COMMUNITY)
Admission: RE | Admit: 2012-03-13 | Discharge: 2012-03-13 | DRG: 479 | Disposition: A | Discharge status: Home / Self Care | Payer: Medicare Other | Source: Ambulatory Visit | Attending: Neurosurgery | Admitting: Neurosurgery

## 2012-03-13 ENCOUNTER — Encounter (HOSPITAL_COMMUNITY): Payer: Self-pay | Admitting: Anesthesiology

## 2012-03-13 ENCOUNTER — Ambulatory Visit (HOSPITAL_COMMUNITY): Payer: Medicare Other

## 2012-03-13 ENCOUNTER — Ambulatory Visit (HOSPITAL_COMMUNITY): Payer: Medicare Other | Admitting: Anesthesiology

## 2012-03-13 DIAGNOSIS — I252 Old myocardial infarction: Secondary | ICD-10-CM

## 2012-03-13 DIAGNOSIS — Z01812 Encounter for preprocedural laboratory examination: Secondary | ICD-10-CM

## 2012-03-13 DIAGNOSIS — R112 Nausea with vomiting, unspecified: Secondary | ICD-10-CM

## 2012-03-13 DIAGNOSIS — F411 Generalized anxiety disorder: Secondary | ICD-10-CM | POA: Diagnosis present

## 2012-03-13 DIAGNOSIS — I10 Essential (primary) hypertension: Secondary | ICD-10-CM | POA: Diagnosis present

## 2012-03-13 DIAGNOSIS — K219 Gastro-esophageal reflux disease without esophagitis: Secondary | ICD-10-CM | POA: Diagnosis present

## 2012-03-13 DIAGNOSIS — I2510 Atherosclerotic heart disease of native coronary artery without angina pectoris: Secondary | ICD-10-CM | POA: Diagnosis present

## 2012-03-13 DIAGNOSIS — M8448XA Pathological fracture, other site, initial encounter for fracture: Secondary | ICD-10-CM | POA: Insufficient documentation

## 2012-03-13 SURGERY — KYPHOPLASTY
Anesthesia: General | Site: Spine Thoracic | Wound class: Clean

## 2012-03-13 MED ORDER — TRAMADOL HCL 50 MG PO TABS
50.0000 mg | ORAL_TABLET | Freq: Four times a day (QID) | ORAL | Status: DC | PRN
  Filled 2012-03-13: qty 1

## 2012-03-13 MED ORDER — METHOCARBAMOL 100 MG/ML IJ SOLN: 500.0000 mg | Freq: Four times a day (QID) | INTRAVENOUS | Status: DC | PRN

## 2012-03-13 MED ORDER — EPHEDRINE SULFATE 50 MG/ML IJ SOLN
INTRAMUSCULAR | Status: DC | PRN
  Administered 2012-03-13: 10 mg via INTRAVENOUS
  Administered 2012-03-13: 15 mg via INTRAVENOUS

## 2012-03-13 MED ORDER — OXYCODONE-ACETAMINOPHEN 5-325 MG PO TABS: 1.0000 | ORAL_TABLET | ORAL | Status: DC | PRN

## 2012-03-13 MED ORDER — HYDROMORPHONE HCL PF 1 MG/ML IJ SOLN: 0.2500 mg | INTRAMUSCULAR | Status: DC | PRN

## 2012-03-13 MED ORDER — ACETAMINOPHEN 325 MG PO TABS: 650.0000 mg | ORAL_TABLET | ORAL | Status: DC | PRN

## 2012-03-13 MED ORDER — ONDANSETRON HCL 4 MG/2ML IJ SOLN: 4.0000 mg | Freq: Once | INTRAMUSCULAR | Status: DC | PRN

## 2012-03-13 MED ORDER — ONDANSETRON 4 MG PO TBDP
4.0000 mg | ORAL_TABLET | Freq: Three times a day (TID) | ORAL | Status: DC | PRN
  Filled 2012-03-13: qty 1

## 2012-03-13 MED ORDER — LACTATED RINGERS IV SOLN
INTRAVENOUS | Status: DC | PRN
  Administered 2012-03-13 (×2): via INTRAVENOUS

## 2012-03-13 MED ORDER — PHENYLEPHRINE HCL 10 MG/ML IJ SOLN
INTRAMUSCULAR | Status: DC | PRN
  Administered 2012-03-13: 80 ug via INTRAVENOUS

## 2012-03-13 MED ORDER — GLYCOPYRROLATE 0.2 MG/ML IJ SOLN
INTRAMUSCULAR | Status: DC | PRN
  Administered 2012-03-13: 0.2 mg via INTRAVENOUS

## 2012-03-13 MED ORDER — METHOCARBAMOL 500 MG PO TABS: 500.0000 mg | ORAL_TABLET | Freq: Four times a day (QID) | ORAL | Status: DC | PRN

## 2012-03-13 MED ORDER — KETOROLAC TROMETHAMINE 30 MG/ML IJ SOLN
15.0000 mg | Freq: Four times a day (QID) | INTRAMUSCULAR | Status: DC
  Administered 2012-03-13: 15 mg via INTRAVENOUS
  Filled 2012-03-13: qty 1

## 2012-03-13 MED ORDER — DOCUSATE SODIUM 100 MG PO CAPS: 100.0000 mg | ORAL_CAPSULE | Freq: Two times a day (BID) | ORAL | Status: DC

## 2012-03-13 MED ORDER — ADULT MULTIVITAMIN W/MINERALS CH
1.0000 | ORAL_TABLET | Freq: Every day | ORAL | Status: DC
  Filled 2012-03-13: qty 1

## 2012-03-13 MED ORDER — POLYVINYL ALCOHOL 1.4 % OP SOLN
1.0000 [drp] | Freq: Two times a day (BID) | OPHTHALMIC | Status: DC
  Filled 2012-03-13: qty 15

## 2012-03-13 MED ORDER — HYDROCODONE-ACETAMINOPHEN 5-325 MG PO TABS: 1.0000 | ORAL_TABLET | ORAL | Status: DC | PRN

## 2012-03-13 MED ORDER — LORAZEPAM 0.5 MG PO TABS: 0.5000 mg | ORAL_TABLET | Freq: Every day | ORAL | Status: DC

## 2012-03-13 MED ORDER — SODIUM CHLORIDE 0.9 % IV SOLN: 250.0000 mL | INTRAVENOUS | Status: DC

## 2012-03-13 MED ORDER — VANCOMYCIN HCL IN DEXTROSE 1-5 GM/200ML-% IV SOLN
1000.0000 mg | Freq: Once | INTRAVENOUS | Status: DC
  Filled 2012-03-13: qty 200

## 2012-03-13 MED ORDER — ONDANSETRON HCL 4 MG/2ML IJ SOLN: 4.0000 mg | INTRAMUSCULAR | Status: DC | PRN

## 2012-03-13 MED ORDER — SIMVASTATIN 10 MG PO TABS: 10.0000 mg | ORAL_TABLET | Freq: Every day | ORAL | Status: DC

## 2012-03-13 MED ORDER — LACTATED RINGERS IV SOLN: INTRAVENOUS | Status: DC

## 2012-03-13 MED ORDER — SUCCINYLCHOLINE CHLORIDE 20 MG/ML IJ SOLN
INTRAMUSCULAR | Status: DC | PRN
Start: 2012-03-13 — End: 2012-03-13
  Administered 2012-03-13: 100 mg via INTRAVENOUS

## 2012-03-13 MED ORDER — SODIUM CHLORIDE 0.9 % IJ SOLN: 3.0000 mL | Freq: Two times a day (BID) | INTRAMUSCULAR | Status: DC

## 2012-03-13 MED ORDER — ASPIRIN EC 81 MG PO TBEC
81.0000 mg | DELAYED_RELEASE_TABLET | Freq: Every day | ORAL | Status: DC
Start: 2012-03-13 — End: 2012-03-13
  Filled 2012-03-13: qty 1

## 2012-03-13 MED ORDER — ACETAMINOPHEN 650 MG RE SUPP: 650.0000 mg | RECTAL | Status: DC | PRN

## 2012-03-13 MED ORDER — SODIUM CHLORIDE 0.9 % IV SOLN
INTRAVENOUS | Status: DC | PRN
  Administered 2012-03-13 (×2): via INTRAVENOUS

## 2012-03-13 MED ORDER — BISOPROLOL-HYDROCHLOROTHIAZIDE 5-6.25 MG PO TABS: 1.0000 | ORAL_TABLET | Freq: Every day | ORAL | Status: DC

## 2012-03-13 MED ORDER — ONDANSETRON HCL 4 MG/2ML IJ SOLN
INTRAMUSCULAR | Status: DC | PRN
  Administered 2012-03-13: 4 mg via INTRAVENOUS

## 2012-03-13 MED ORDER — ACETAMINOPHEN 10 MG/ML IV SOLN
INTRAVENOUS | Status: AC
  Filled 2012-03-13: qty 100

## 2012-03-13 MED ORDER — VANCOMYCIN HCL IN DEXTROSE 1-5 GM/200ML-% IV SOLN
INTRAVENOUS | Status: AC
  Administered 2012-03-13: 1000 mg via INTRAVENOUS
  Filled 2012-03-13: qty 200

## 2012-03-13 MED ORDER — POLYETHYLENE GLYCOL 3350 17 G PO PACK
17.0000 g | PACK | Freq: Two times a day (BID) | ORAL | Status: DC
  Filled 2012-03-13: qty 1

## 2012-03-13 MED ORDER — HYDROCORTISONE 2.5 % RE CREA
1.0000 "application " | TOPICAL_CREAM | Freq: Two times a day (BID) | RECTAL | Status: DC
  Filled 2012-03-13: qty 28.35

## 2012-03-13 MED ORDER — BISACODYL 10 MG RE SUPP: 10.0000 mg | Freq: Every day | RECTAL | Status: DC | PRN

## 2012-03-13 MED ORDER — FENTANYL CITRATE 0.05 MG/ML IJ SOLN
INTRAMUSCULAR | Status: DC | PRN
  Administered 2012-03-13: 100 ug via INTRAVENOUS

## 2012-03-13 MED ORDER — FLEET ENEMA 7-19 GM/118ML RE ENEM
1.0000 | ENEMA | Freq: Once | RECTAL | Status: DC | PRN
  Filled 2012-03-13: qty 1

## 2012-03-13 MED ORDER — PROPYLENE GLYCOL 0.6 % OP SOLN: 1.0000 [drp] | Freq: Two times a day (BID) | OPHTHALMIC | Status: DC

## 2012-03-13 MED ORDER — PROPOFOL 10 MG/ML IV EMUL
INTRAVENOUS | Status: DC | PRN
  Administered 2012-03-13: 80 mg via INTRAVENOUS
  Administered 2012-03-13: 20 mg via INTRAVENOUS

## 2012-03-13 MED ORDER — 0.9 % SODIUM CHLORIDE (POUR BTL) OPTIME
TOPICAL | Status: DC | PRN
  Administered 2012-03-13: 1000 mL

## 2012-03-13 MED ORDER — PANTOPRAZOLE SODIUM 40 MG PO TBEC: 40.0000 mg | DELAYED_RELEASE_TABLET | Freq: Every day | ORAL | Status: DC

## 2012-03-13 MED ORDER — MAGNESIUM HYDROXIDE 400 MG/5ML PO SUSP: 30.0000 mL | Freq: Every day | ORAL | Status: DC | PRN

## 2012-03-13 MED ORDER — LIDOCAINE HCL (CARDIAC) 20 MG/ML IV SOLN
INTRAVENOUS | Status: DC | PRN
  Administered 2012-03-13: 60 mg via INTRAVENOUS

## 2012-03-13 MED ORDER — NALOXONE HCL 0.4 MG/ML IJ SOLN
INTRAMUSCULAR | Status: DC | PRN
  Administered 2012-03-13: 200 ug via INTRAVENOUS

## 2012-03-13 MED ORDER — IOHEXOL 300 MG/ML  SOLN
INTRAMUSCULAR | Status: DC | PRN
  Administered 2012-03-13: 50 mL via INTRAVENOUS

## 2012-03-13 MED ORDER — POLYETHYLENE GLYCOL 3350 17 GM/SCOOP PO POWD
17.0000 g | Freq: Two times a day (BID) | ORAL | Status: DC
  Filled 2012-03-13: qty 255

## 2012-03-13 MED ORDER — ACETAMINOPHEN 10 MG/ML IV SOLN
INTRAVENOUS | Status: DC | PRN
  Administered 2012-03-13: 1000 mg via INTRAVENOUS

## 2012-03-13 MED ORDER — SODIUM CHLORIDE 0.9 % IJ SOLN: 3.0000 mL | INTRAMUSCULAR | Status: DC | PRN

## 2012-03-13 MED ORDER — LIDOCAINE-EPINEPHRINE 1 %-1:100000 IJ SOLN
INTRAMUSCULAR | Status: DC | PRN
  Administered 2012-03-13: 10 mL

## 2012-03-13 SURGICAL SUPPLY — 42 items
BANDAGE ADHESIVE 1X3 (GAUZE/BANDAGES/DRESSINGS) ×2 IMPLANT
BLADE SURG 15 STRL LF DISP TIS (BLADE) ×1 IMPLANT
BLADE SURG 15 STRL SS (BLADE) ×1
BLADE SURG ROTATE 9660 (MISCELLANEOUS) IMPLANT
CEMENT BONE KYPHX HV R (Orthopedic Implant) ×2 IMPLANT
CLOTH BEACON ORANGE TIMEOUT ST (SAFETY) ×2 IMPLANT
CONT SPEC 4OZ CLIKSEAL STRL BL (MISCELLANEOUS) ×4 IMPLANT
DERMABOND ADHESIVE PROPEN (GAUZE/BANDAGES/DRESSINGS) ×1
DERMABOND ADVANCED (GAUZE/BANDAGES/DRESSINGS) ×1
DERMABOND ADVANCED .7 DNX12 (GAUZE/BANDAGES/DRESSINGS) ×1 IMPLANT
DERMABOND ADVANCED .7 DNX6 (GAUZE/BANDAGES/DRESSINGS) ×1 IMPLANT
DEVICE BIOPSY BONE KYPHX (INSTRUMENTS) ×2 IMPLANT
DRAPE C-ARM 42X72 X-RAY (DRAPES) ×2 IMPLANT
DRAPE LAPAROTOMY 100X72X124 (DRAPES) ×2 IMPLANT
DRAPE PROXIMA HALF (DRAPES) IMPLANT
DRESSING TELFA 8X3 (GAUZE/BANDAGES/DRESSINGS) IMPLANT
DURAPREP 26ML APPLICATOR (WOUND CARE) ×2 IMPLANT
GAUZE SPONGE 4X4 16PLY XRAY LF (GAUZE/BANDAGES/DRESSINGS) ×2 IMPLANT
GLOVE BIOGEL PI IND STRL 8.5 (GLOVE) ×2 IMPLANT
GLOVE BIOGEL PI INDICATOR 8.5 (GLOVE) ×2
GLOVE ECLIPSE 7.5 STRL STRAW (GLOVE) ×2 IMPLANT
GLOVE EXAM NITRILE LRG STRL (GLOVE) IMPLANT
GLOVE EXAM NITRILE MD LF STRL (GLOVE) IMPLANT
GLOVE EXAM NITRILE XL STR (GLOVE) IMPLANT
GLOVE EXAM NITRILE XS STR PU (GLOVE) IMPLANT
GLOVE SURG SS PI 8.0 STRL IVOR (GLOVE) ×4 IMPLANT
GOWN BRE IMP SLV AUR LG STRL (GOWN DISPOSABLE) IMPLANT
GOWN BRE IMP SLV AUR XL STRL (GOWN DISPOSABLE) ×2 IMPLANT
GOWN STRL REIN 2XL LVL4 (GOWN DISPOSABLE) ×4 IMPLANT
KIT BASIN OR (CUSTOM PROCEDURE TRAY) ×2 IMPLANT
KIT ROOM TURNOVER OR (KITS) ×2 IMPLANT
NEEDLE HYPO 25X1 1.5 SAFETY (NEEDLE) ×2 IMPLANT
NS IRRIG 1000ML POUR BTL (IV SOLUTION) ×2 IMPLANT
PACK SURGICAL SETUP 50X90 (CUSTOM PROCEDURE TRAY) ×2 IMPLANT
PAD ARMBOARD 7.5X6 YLW CONV (MISCELLANEOUS) ×6 IMPLANT
STAPLER SKIN PROX WIDE 3.9 (STAPLE) IMPLANT
SUT VIC AB 3-0 SH 8-18 (SUTURE) ×2 IMPLANT
SYR CONTROL 10ML LL (SYRINGE) ×2 IMPLANT
TOWEL OR 17X24 6PK STRL BLUE (TOWEL DISPOSABLE) ×2 IMPLANT
TOWEL OR 17X26 10 PK STRL BLUE (TOWEL DISPOSABLE) ×2 IMPLANT
TRAY KYPHOPAK 15/3 ONESTEP 1ST (MISCELLANEOUS) ×2 IMPLANT
WATER STERILE IRR 1000ML POUR (IV SOLUTION) ×2 IMPLANT

## 2012-03-13 NOTE — Transfer of Care (Signed)
Immediate Anesthesia Transfer of Care Note  Patient: Andre Mueller  Procedure(s) Performed: Procedure(s) (LRB): KYPHOPLASTY (N/A)  Patient Location: PACU  Anesthesia Type: General  Level of Consciousness: awake, alert , oriented and patient cooperative  Airway & Oxygen Therapy: Patient Spontanous Breathing and Patient connected to nasal cannula oxygen  Post-op Assessment: Report given to PACU RN and Post -op Vital signs reviewed and stable  Post vital signs: Reviewed and stable  Complications: No apparent anesthesia complications

## 2012-03-13 NOTE — Preoperative (Addendum)
Beta Blockers   Pt took Ziac @   4:30     03/13/12

## 2012-03-13 NOTE — H&P (Signed)
See H& P.

## 2012-03-13 NOTE — Op Note (Signed)
03/13/2012  9:01 AM  PATIENT:  Andre Mueller  78 y.o. male  PRE-OPERATIVE DIAGNOSIS:  Compression fracture T11  POST-OPERATIVE DIAGNOSIS: same  PROCEDURE:  Procedure(s): Balloon KYPHOPLASTY T11 , with methylmethacrylate, flouroscopy, bone biopsy  SURGEON:  Surgeon(s): Clydene Fake, MD    ANESTHESIA:   general  EBL:  Total I/O In: 1250 [I.V.:1250] Out: -   BLOOD ADMINISTERED:none  DRAINS: none   SPECIMEN:  Core Needle Biopsy  DICTATION: DICTATION: Patient is a 78 year old gentleman with 1 month history of back pain - Ct found compression fx T11 - and when compared to abd CT done few weeks prior - T11 fx was there but it has compressied further. Pt elected to bring patient for kyphoplasty of this T11 compression fracture.  Patient brought into the operating room general anesthesia induced patient placed in a prone position fluoroscopy in AP and lateral projections were then set up to see the T11compression fracture in pedicles patient prepped draped in a sterile fashion. Sight of incision injected with 10 cc 1% lidocaine. stab incision was made and the bone needle placed into the left T11 pedicle through the pedicle into the vertebral body using fluoroscopy in 2 planes. A core biopsy was then done sent to pathology. the drill was used to drill into the vertebral verbally get just across the midline . kyphoplasty balloon was then placed into the vertebral body bone was carefully inflated [place] is a significant reduction in the fracture. Balloon was removed and methylmethacrylate was injected into the T11 vertebral body a total of 6.2 cc of methylmethacrylate was injected using fluoroscopy. Working port needle t was then removed and finalAP and lat Xrayc images showing good position of methylmethacrylate in the T11 vertebral body and some reduction of compression of the fracture. One subcuticular 3-0 Vicryl suture was placed skin closed with Dermabond. Band-Aid placed. patient was then  placed back in the supine position woken and transferred recovery room    PLAN OF CARE: Admit to inpatient   PATIENT DISPOSITION:  PACU - hemodynamically stable.

## 2012-03-13 NOTE — Anesthesia Postprocedure Evaluation (Signed)
  Anesthesia Post-op Note  Patient: Andre Mueller  Procedure(s) Performed: Procedure(s) (LRB): KYPHOPLASTY (N/A)  Patient Location: PACU  Anesthesia Type: General  Level of Consciousness: awake, alert  and oriented  Airway and Oxygen Therapy: Patient Spontanous Breathing and Patient connected to nasal cannula oxygen  Post-op Pain: mild  Post-op Assessment: Post-op Vital signs reviewed and [name] Cardiovascular Status Stable  Post-op Vital Signs: stable  Complications: No apparent anesthesia complications

## 2012-03-13 NOTE — Progress Notes (Signed)
Pt. Tolerated procedure well. Pt. Alert and oriented,follows simple instructions, denies pain. Incision area without swelling, redness or S/S of infection. Voiding adequate clear yellow urine. Moving all extremities well and vitals stable and documented. Script given to Pt. and spouse. Lumbar surgery notes instructions given to patient and family member for home safety and precautions.Pt and family stated understanding of instructions given. And Pt.'s son was instructed over the phone about the importance of instructions given to patient at discharge.

## 2012-03-13 NOTE — Interval H&P Note (Signed)
History and Physical Interval Note:  03/13/2012 7:48 AM  Andre Mueller  has presented today for surgery, with the diagnosis of Pathologic fracture, Lumbago  The various methods of treatment have been discussed with the patient and family. After consideration of risks, benefits and other options for treatment, the patient has consented to  Procedure(s) (LRB): KYPHOPLASTY (N/A) as a surgical intervention .  [name] history has been reviewed, patient examined, no change in status, stable for surgery.  I have reviewed the patients' chart and labs.  Questions were answered to the patient's satisfaction.     Amedee Cerrone R

## 2012-03-13 NOTE — Discharge Summary (Signed)
Physician Discharge Summary  Patient ID: Andre Mueller MRN: [id number] DOB/AGE: 76/06/1921 76 y.o.  Admit date: 03/13/2012 Discharge date: 03/13/2012  Admission Diagnoses:Compression fracture T11    Discharge Diagnoses: Compression fracture T11  Active Problems:  * No active hospital problems. *    Discharged Condition: good  Hospital Course: pty admitted day of surgery - underwent procedure below - pt doing well and d/c home  Consults: None  Significant Diagnostic Studies: none  Treatments: surgery: Balloon KYPHOPLASTY T11 , with methylmethacrylate, flouroscopy, bone biopsy   Discharge Exam: Blood pressure 134/82, pulse 75, temperature 97.9 F (36.6 C), temperature source Oral, resp. rate 18, SpO2 94.00%. Wound:c/d/i  Disposition: home  Discharge Orders    Future Appointments: Provider: Department: Dept Phone: Center:   08/21/2012 9:30 AM Newt Lukes, MD [facility] [phone] [place] [facility]     Medication List  As of 03/13/2012  9:11 AM   TAKE these medications         aspirin EC 81 MG tablet   Take 81 mg by mouth daily.      bisoprolol-hydrochlorothiazide 5-6.25 MG per tablet   Commonly known as: ZIAC   Take 1 tablet by mouth daily.      fluticasone 50 MCG/ACT nasal spray   Commonly known as: FLONASE   Place 1 spray into the nose Daily.      hydrocortisone 2.5 % rectal cream   Commonly known as: ANUSOL-HC   Place 1 application rectally 2 (two) times daily.      LORazepam 0.5 MG tablet   Commonly known as: ATIVAN   Take 0.5 mg by mouth at bedtime.      mulitivitamin with minerals Tabs   Take 1 tablet by mouth daily.      omeprazole 20 MG capsule   Commonly known as: PRILOSEC   Take 1 capsule (20 mg total) by mouth daily.      ondansetron 4 MG disintegrating tablet   Commonly known as: ZOFRAN-ODT   Take 1 tablet (4 mg total) by mouth every 8 (eight) hours as needed for nausea.      oxyCODONE-acetaminophen 5-325 MG per tablet   Commonly known as:  PERCOCET   Take 1 tab every 6 hours as needed for back pain.      polyethylene glycol powder powder   Commonly known as: GLYCOLAX/MIRALAX   Take 17 g by mouth 2 (two) times daily.      simvastatin 10 MG tablet   Commonly known as: ZOCOR   Take 1 tablet (10 mg total) by mouth at bedtime.      SYSTANE BALANCE 0.6 % Soln   Generic drug: Propylene Glycol   Place 1-2 drops into both eyes 2 (two) times daily.      traMADol 50 MG tablet   Commonly known as: ULTRAM   Take 1 tablet (50 mg total) by mouth every 8 (eight) hours as needed for pain.             Signed: Clydene Fake, MD 03/13/2012, 9:11 AM

## 2012-03-13 NOTE — Anesthesia Preprocedure Evaluation (Addendum)
Anesthesia Evaluation  Patient identified by MRN, date of birth, ID band Patient awake    Reviewed: Allergy & Precautions, H&P , NPO status , [name] Chart, lab work & pertinent test results, reviewed documented beta blocker date and time   History of Anesthesia Complications (+) PONV  Airway  TM Distance: >3 FB     Dental  (+) Edentulous Upper and Edentulous Lower   Pulmonary  breath sounds clear to auscultation  Pulmonary exam normal       Cardiovascular hypertension, Pt. on home beta blockers and Pt. on medications + CAD and + Past MI Rhythm:Regular Rate:Normal     Neuro/Psych PSYCHIATRIC DISORDERS Anxiety    GI/Hepatic GERD-  Medicated and Controlled,Takes Zofran PO daily   Endo/Other    Renal/GU      Musculoskeletal  (+) Arthritis -, Osteoarthritis,    Abdominal (+)  Abdomen: soft.    Peds  Hematology   Anesthesia Other Findings   Reproductive/Obstetrics                         Anesthesia Physical Anesthesia Plan  ASA: III  Anesthesia Plan: General   Post-op Pain Management:    Induction: Intravenous  Airway Management Planned: Oral ETT  Additional Equipment:   Intra-op Plan:   Post-operative Plan: Extubation in OR  Informed Consent: I have reviewed the patients History and Physical, chart, labs and discussed the procedure including the risks, benefits and alternatives for the proposed anesthesia with the patient or authorized representative who has indicated his/her understanding and acceptance.     Plan Discussed with: CRNA and Surgeon  Anesthesia Plan Comments: (T11 compression Fracture H/O post-op N/V H/O upper GI bleed CAD S/P CABG 2005 Dementia  Plan GA  Kipp Brood, MD )        Anesthesia Quick Evaluation

## 2012-03-17 ENCOUNTER — Telehealth: Payer: Self-pay | Admitting: Internal Medicine

## 2012-03-17 ENCOUNTER — Ambulatory Visit: Payer: Medicare Other | Admitting: Internal Medicine

## 2012-03-17 NOTE — Telephone Encounter (Signed)
Pt was initially seen by me.  We are following, re: constipation. He can be seen in follow-up for constipation, but this is not urgent. He should follow-up with me -- this can be within 1 month, unless he requested Dr. Juanda Chance by preference. Thanks

## 2012-03-17 NOTE — Telephone Encounter (Signed)
Pt had kyphoplasty at the hospital. Pt is calling wanting to know if he needs a follow-up OV with Dr. Juanda Chance. Please advise.

## 2012-03-17 NOTE — Telephone Encounter (Signed)
Pt scheduled to see Dr. Rhea Belton 04/24/12 @3pm . Pt aware of appt date and time.

## 2012-03-19 ENCOUNTER — Encounter (HOSPITAL_COMMUNITY): Payer: Self-pay

## 2012-04-08 ENCOUNTER — Encounter: Payer: Self-pay | Admitting: Internal Medicine

## 2012-04-09 ENCOUNTER — Ambulatory Visit: Payer: Medicare Other | Admitting: Internal Medicine

## 2012-04-09 ENCOUNTER — Encounter: Payer: Self-pay | Admitting: Internal Medicine

## 2012-04-09 ENCOUNTER — Ambulatory Visit (INDEPENDENT_AMBULATORY_CARE_PROVIDER_SITE_OTHER): Payer: Medicare Other | Admitting: Internal Medicine

## 2012-04-09 VITALS — BP 132/64 | HR 80 | Ht 70.0 in | Wt 163.0 lb

## 2012-04-09 DIAGNOSIS — IMO0002 Reserved for concepts with insufficient information to code with codable children: Secondary | ICD-10-CM

## 2012-04-09 DIAGNOSIS — K5900 Constipation, unspecified: Secondary | ICD-10-CM

## 2012-04-09 DIAGNOSIS — R110 Nausea: Secondary | ICD-10-CM

## 2012-04-09 MED ORDER — SENNA-DOCUSATE SODIUM 8.6-50 MG PO TABS: 2.0000 | ORAL_TABLET | Freq: Every day | ORAL | Status: DC

## 2012-04-09 NOTE — Progress Notes (Signed)
Subjective:    Patient ID: Andre Mueller, male    DOB: [birth year], 78 y.o.   MRN: [id number]  HPI Andre Mueller is an 78 yo male with PMH of hypertension, hypercholesterolemia, GERD, anxiety, MI, and constipation who seen in followup. He was initially seen in late April/early May 2013 for severe constipation and lower back pain. His severe constipation was initially unresponsive to laxatives, and he was admitted to the hospital for constipation/obstipation. During this hospital stay he underwent bowel preparation and eventual colonoscopy on 02/29/2012. This revealed 2 large sigmoid colon polyps, severe diverticulosis, and internal hemorrhoids. The sigmoid polyps were removed and adenomatous, but negative for high-grade dysplasia or malignancy. Imaging was also performed which revealed a lumbar compression fracture, and he is status post kyphoplasty.  He returns today with his son and wife. Her constipation standpoint, he has improved. He is using 2 Senokot tabs daily. He is having one bowel movement daily. He does occasionally have nausea without vomiting. He is not currently having any abdominal pain. He reports he is eating well. His son notes that he does not drink enough fluid, and he is worried that he is often dehydrated to some degree. He continues to have mid back pain. This is not necessarily in the midline, but tends to be bilateral at times alternating sides. This pain can be somewhat debilitating. He was hoping it would improve after kyphoplasty, but has not improved as much as he had hoped. He has lost about 7 pounds since April 2013. He denies early satiety. His appetite remains good and he is able he is hopefully of food.  Review of Systems As per history of present illness, otherwise negative, except notable for occasional thick postnasal drip  Current Medications, Allergies, Past Medical History, Past Surgical History, Family History and Social History were reviewed in [employer] record.     Objective:   Physical Exam BP 132/64  Pulse 80  Ht 5\' 10"  (1.778 m)  Wt 163 lb (73.936 kg)  BMI 23.39 kg/m2 Constitutional: Well-developed, elderly appearing male in no acute distress. HEENT: Normocephalic and atraumatic. Oropharynx is clear and moist. No oropharyngeal exudate. Conjunctivae are normal. Pupils are equal round and reactive to light. No scleral icterus. Neck: Neck supple. Trachea midline. Cardiovascular: Normal rate, regular rhythm and intact distal pulses. No M/R/G Pulmonary/chest: Effort normal and breath sounds normal. No wheezing, rales or rhonchi. Abdominal: Soft, there is a midline, long well-healed abdominal scar, nontender, nondistended. There is a large right lower quadrant hernia which contains bowels with active bowel sounds, and is reducible when supine, Bowel sounds active throughout.  No hepatosplenomegaly. Extremities: no clubbing, cyanosis, or edema Lymphadenopathy: No cervical adenopathy noted. Neurological: Alert and oriented to person place and time. Skin: Skin is warm and dry. No rashes noted. Psychiatric: Normal mood and affect. Behavior is normal.     Assessment & Plan:  78 yo male with PMH of hypertension, hypercholesterolemia, GERD, anxiety, MI, and constipation who seen in followup. He was initially seen in late April/early May 2013 for severe constipation and lower back pain  1. Constipation -- this issue has largely improved since we first met Andre Mueller.  I recommended that he continue on Senokot 2 tabs daily. I've encouraged him to drink fluids throughout the day, in an attempt to stay hydrated. He voices understanding.  2. Nausea -- this seems to be a fairly mild symptom for him at present. He is not having any other alarm symptoms such as vomiting,  early early satiety, dysphagia or odynophagia. This may be related to medication, or perhaps pain. He is on a daily PPI. He is taking an antibiotic, which I feel to be  ondansetron, and this works very well for his intermittent symptoms. For now he can continue this medication, and he should let us know if this worsens or changes in any way.  3. Back pain -- this continues to be an issue for him, and at present is his largest concern. It also continues to be a concern of his son's.  He was seen by Dr. Phoebe Perch for kyphoplasty in followup. The family has asked for a possible orthopedic consult, again to evaluate his back pain, given his lack of improvement after kyphoplasty. He also discussed this with his PCP. I do not expect he would be a good surgical candidate for any intervention, and this may cause more harm than good.  He will followup on an as-needed basis per his request.

## 2012-04-09 NOTE — Patient Instructions (Addendum)
We have sent the following medications to your pharmacy for you to pick up at your convenience: Senna  Dr. Rhea Belton would like you to increase your daily fluid intake.  You have been referred to [facility].  [phone]  Your appointment is scheduled with Dr. Alveda Reasons on 04/24/2012 @ 3:30pm

## 2012-04-10 ENCOUNTER — Other Ambulatory Visit: Payer: Self-pay | Admitting: Internal Medicine

## 2012-04-10 ENCOUNTER — Other Ambulatory Visit (HOSPITAL_COMMUNITY): Payer: Self-pay | Admitting: Nurse Practitioner

## 2012-04-14 ENCOUNTER — Other Ambulatory Visit (HOSPITAL_COMMUNITY): Payer: Self-pay | Admitting: Nurse Practitioner

## 2012-04-15 ENCOUNTER — Other Ambulatory Visit (HOSPITAL_COMMUNITY): Payer: Self-pay | Admitting: Nurse Practitioner

## 2012-04-24 ENCOUNTER — Ambulatory Visit: Payer: Medicare Other | Admitting: Internal Medicine

## 2012-04-24 ENCOUNTER — Other Ambulatory Visit: Payer: Self-pay | Admitting: Dermatology

## 2012-05-02 ENCOUNTER — Telehealth: Payer: Self-pay | Admitting: Internal Medicine

## 2012-05-02 NOTE — Telephone Encounter (Signed)
Patient is advised that he should see his primary care MD for mucous in his throat.  He will call back for any GI concerns.

## 2012-05-06 ENCOUNTER — Observation Stay (HOSPITAL_COMMUNITY)
Admission: EM | Admit: 2012-05-06 | Discharge: 2012-05-08 | Payer: Medicare Other | Source: Ambulatory Visit | Attending: Internal Medicine | Admitting: Internal Medicine

## 2012-05-06 ENCOUNTER — Encounter (HOSPITAL_COMMUNITY): Payer: Self-pay | Admitting: Cardiology

## 2012-05-06 ENCOUNTER — Emergency Department (HOSPITAL_COMMUNITY): Payer: Medicare Other

## 2012-05-06 DIAGNOSIS — K5900 Constipation, unspecified: Secondary | ICD-10-CM | POA: Insufficient documentation

## 2012-05-06 DIAGNOSIS — H409 Unspecified glaucoma: Secondary | ICD-10-CM | POA: Insufficient documentation

## 2012-05-06 DIAGNOSIS — F0390 Unspecified dementia without behavioral disturbance: Secondary | ICD-10-CM | POA: Insufficient documentation

## 2012-05-06 DIAGNOSIS — K921 Melena: Secondary | ICD-10-CM

## 2012-05-06 DIAGNOSIS — IMO0002 Reserved for concepts with insufficient information to code with codable children: Secondary | ICD-10-CM

## 2012-05-06 DIAGNOSIS — K5730 Diverticulosis of large intestine without perforation or abscess without bleeding: Secondary | ICD-10-CM | POA: Insufficient documentation

## 2012-05-06 DIAGNOSIS — D126 Benign neoplasm of colon, unspecified: Secondary | ICD-10-CM | POA: Diagnosis present

## 2012-05-06 DIAGNOSIS — R262 Difficulty in walking, not elsewhere classified: Secondary | ICD-10-CM | POA: Insufficient documentation

## 2012-05-06 DIAGNOSIS — I10 Essential (primary) hypertension: Secondary | ICD-10-CM | POA: Insufficient documentation

## 2012-05-06 DIAGNOSIS — E876 Hypokalemia: Secondary | ICD-10-CM | POA: Insufficient documentation

## 2012-05-06 DIAGNOSIS — Z9049 Acquired absence of other specified parts of digestive tract: Secondary | ICD-10-CM | POA: Insufficient documentation

## 2012-05-06 DIAGNOSIS — I2510 Atherosclerotic heart disease of native coronary artery without angina pectoris: Secondary | ICD-10-CM | POA: Insufficient documentation

## 2012-05-06 DIAGNOSIS — K219 Gastro-esophageal reflux disease without esophagitis: Secondary | ICD-10-CM | POA: Insufficient documentation

## 2012-05-06 DIAGNOSIS — K644 Residual hemorrhoidal skin tags: Secondary | ICD-10-CM | POA: Insufficient documentation

## 2012-05-06 DIAGNOSIS — E785 Hyperlipidemia, unspecified: Secondary | ICD-10-CM | POA: Insufficient documentation

## 2012-05-06 DIAGNOSIS — K648 Other hemorrhoids: Secondary | ICD-10-CM | POA: Insufficient documentation

## 2012-05-06 DIAGNOSIS — I252 Old myocardial infarction: Secondary | ICD-10-CM | POA: Insufficient documentation

## 2012-05-06 DIAGNOSIS — F411 Generalized anxiety disorder: Secondary | ICD-10-CM | POA: Insufficient documentation

## 2012-05-06 DIAGNOSIS — K922 Gastrointestinal hemorrhage, unspecified: Principal | ICD-10-CM | POA: Insufficient documentation

## 2012-05-06 HISTORY — DX: Malignant (primary) neoplasm, unspecified: C80.1

## 2012-05-06 LAB — HEMOGLOBIN AND HEMATOCRIT, BLOOD: HCT: 34.9 % — ABNORMAL LOW (ref 39.0–52.0)

## 2012-05-06 LAB — PROTIME-INR
INR: 1.13 (ref 0.00–1.49)
Prothrombin Time: 14.7 seconds (ref 11.6–15.2)

## 2012-05-06 LAB — CBC WITH DIFFERENTIAL/PLATELET
Basophils Relative: 0 % (ref 0–1)
Eosinophils Absolute: 0.1 10*3/uL (ref 0.0–0.7)
Eosinophils Relative: 1 % (ref 0–5)
HCT: 36.8 % — ABNORMAL LOW (ref 39.0–52.0)
Lymphs Abs: 1.6 10*3/uL (ref 0.7–4.0)
MCHC: 35.6 g/dL (ref 30.0–36.0)
Monocytes Relative: 11 % (ref 3–12)
Neutrophils Relative %: 62 % (ref 43–77)
WBC: 6.1 10*3/uL (ref 4.0–10.5)

## 2012-05-06 LAB — COMPREHENSIVE METABOLIC PANEL
Albumin: 3.3 g/dL — ABNORMAL LOW (ref 3.5–5.2)
BUN: 18 mg/dL (ref 6–23)
Calcium: 9.3 mg/dL (ref 8.4–10.5)
Creatinine, Ser: 0.82 mg/dL (ref 0.50–1.35)
GFR calc Af Amer: 89 mL/min — ABNORMAL LOW (ref >90)
Glucose, Bld: 130 mg/dL — ABNORMAL HIGH (ref 70–99)
Potassium: 3.5 mEq/L (ref 3.5–5.1)
Total Protein: 9.1 g/dL — ABNORMAL HIGH (ref 6.0–8.3)

## 2012-05-06 LAB — TYPE AND SCREEN
ABO/RH(D): O POS
Antibody Screen: NEGATIVE

## 2012-05-06 MED ORDER — TRAMADOL HCL 50 MG PO TABS
50.0000 mg | ORAL_TABLET | Freq: Three times a day (TID) | ORAL | Status: DC | PRN
Start: 2012-05-06 — End: 2012-05-08
  Filled 2012-05-06: qty 1

## 2012-05-06 MED ORDER — NITROGLYCERIN 0.4 MG SL SUBL: 0.4000 mg | SUBLINGUAL_TABLET | SUBLINGUAL | Status: DC | PRN

## 2012-05-06 MED ORDER — SENNA-DOCUSATE SODIUM 8.6-50 MG PO TABS: 2.0000 | ORAL_TABLET | Freq: Every day | ORAL | Status: DC

## 2012-05-06 MED ORDER — SODIUM CHLORIDE 0.9 % IJ SOLN
3.0000 mL | Freq: Two times a day (BID) | INTRAMUSCULAR | Status: DC
  Administered 2012-05-06: 3 mL via INTRAVENOUS

## 2012-05-06 MED ORDER — MORPHINE SULFATE 4 MG/ML IJ SOLN: 2.0000 mg | INTRAMUSCULAR | Status: DC | PRN

## 2012-05-06 MED ORDER — ONDANSETRON HCL 4 MG/2ML IJ SOLN: 4.0000 mg | Freq: Four times a day (QID) | INTRAMUSCULAR | Status: DC | PRN

## 2012-05-06 MED ORDER — ACETAMINOPHEN 325 MG PO TABS: 650.0000 mg | ORAL_TABLET | Freq: Four times a day (QID) | ORAL | Status: DC | PRN

## 2012-05-06 MED ORDER — SODIUM CHLORIDE 0.9 % IV SOLN
80.0000 mg | Freq: Once | INTRAVENOUS | Status: AC
  Administered 2012-05-06: 80 mg via INTRAVENOUS
  Filled 2012-05-06: qty 80

## 2012-05-06 MED ORDER — SIMVASTATIN 10 MG PO TABS
10.0000 mg | ORAL_TABLET | Freq: Every day | ORAL | Status: DC
  Administered 2012-05-06 – 2012-05-07 (×2): 10 mg via ORAL
  Filled 2012-05-06 (×3): qty 1

## 2012-05-06 MED ORDER — PROPYLENE GLYCOL 0.6 % OP SOLN: 1.0000 [drp] | Freq: Two times a day (BID) | OPHTHALMIC | Status: DC

## 2012-05-06 MED ORDER — AMLODIPINE BESYLATE 5 MG PO TABS
5.0000 mg | ORAL_TABLET | Freq: Every day | ORAL | Status: DC
  Administered 2012-05-07 – 2012-05-08 (×2): 5 mg via ORAL
  Filled 2012-05-06 (×2): qty 1

## 2012-05-06 MED ORDER — SODIUM CHLORIDE 0.9 % IV SOLN: 250.0000 mL | INTRAVENOUS | Status: DC | PRN

## 2012-05-06 MED ORDER — HYDROCORTISONE ACETATE 25 MG RE SUPP
25.0000 mg | Freq: Two times a day (BID) | RECTAL | Status: DC
  Administered 2012-05-06 – 2012-05-08 (×4): 25 mg via RECTAL
  Filled 2012-05-06 (×6): qty 1

## 2012-05-06 MED ORDER — BISOPROLOL-HYDROCHLOROTHIAZIDE 5-6.25 MG PO TABS
1.0000 | ORAL_TABLET | Freq: Every day | ORAL | Status: DC
  Administered 2012-05-07 – 2012-05-08 (×2): 1 via ORAL
  Filled 2012-05-06 (×2): qty 1

## 2012-05-06 MED ORDER — ONDANSETRON HCL 4 MG PO TABS: 4.0000 mg | ORAL_TABLET | Freq: Four times a day (QID) | ORAL | Status: DC | PRN

## 2012-05-06 MED ORDER — FLUTICASONE PROPIONATE 50 MCG/ACT NA SUSP
1.0000 | Freq: Every day | NASAL | Status: DC | PRN
  Filled 2012-05-06: qty 16

## 2012-05-06 MED ORDER — SODIUM CHLORIDE 0.9 % IV BOLUS (SEPSIS)
1000.0000 mL | Freq: Once | INTRAVENOUS | Status: AC
  Administered 2012-05-06: 1000 mL via INTRAVENOUS

## 2012-05-06 MED ORDER — PANTOPRAZOLE SODIUM 40 MG PO TBEC
40.0000 mg | DELAYED_RELEASE_TABLET | Freq: Every day | ORAL | Status: DC
  Administered 2012-05-07 – 2012-05-08 (×2): 40 mg via ORAL
  Filled 2012-05-06 (×2): qty 1

## 2012-05-06 MED ORDER — ACETAMINOPHEN 650 MG RE SUPP: 650.0000 mg | Freq: Four times a day (QID) | RECTAL | Status: DC | PRN

## 2012-05-06 MED ORDER — SODIUM CHLORIDE 0.9 % IJ SOLN: 3.0000 mL | INTRAMUSCULAR | Status: DC | PRN

## 2012-05-06 MED ORDER — BENAZEPRIL HCL 40 MG PO TABS
40.0000 mg | ORAL_TABLET | Freq: Every day | ORAL | Status: DC
  Administered 2012-05-07 – 2012-05-08 (×2): 40 mg via ORAL
  Filled 2012-05-06 (×2): qty 1

## 2012-05-06 MED ORDER — AMLODIPINE BESY-BENAZEPRIL HCL 5-40 MG PO CAPS: 1.0000 | ORAL_CAPSULE | Freq: Every day | ORAL | Status: DC

## 2012-05-06 MED ORDER — SODIUM CHLORIDE 0.9 % IV SOLN: INTRAVENOUS | Status: DC

## 2012-05-06 MED ORDER — POLYVINYL ALCOHOL 1.4 % OP SOLN
1.0000 [drp] | Freq: Two times a day (BID) | OPHTHALMIC | Status: DC
  Administered 2012-05-06 – 2012-05-07 (×2): 1 [drp] via OPHTHALMIC
  Administered 2012-05-07: 2 [drp] via OPHTHALMIC
  Administered 2012-05-08: 1 [drp] via OPHTHALMIC
  Filled 2012-05-06: qty 15

## 2012-05-06 MED ORDER — SENNOSIDES-DOCUSATE SODIUM 8.6-50 MG PO TABS
2.0000 | ORAL_TABLET | Freq: Every day | ORAL | Status: DC
  Administered 2012-05-06: 2 via ORAL
  Filled 2012-05-06: qty 2

## 2012-05-06 MED ORDER — LORAZEPAM 0.5 MG PO TABS
0.5000 mg | ORAL_TABLET | Freq: Every day | ORAL | Status: DC
Start: 2012-05-06 — End: 2012-05-08
  Administered 2012-05-06 – 2012-05-07 (×2): 0.5 mg via ORAL
  Filled 2012-05-06 (×2): qty 1

## 2012-05-06 MED ORDER — ADULT MULTIVITAMIN W/MINERALS CH
1.0000 | ORAL_TABLET | Freq: Every day | ORAL | Status: DC
  Administered 2012-05-06 – 2012-05-08 (×3): 1 via ORAL
  Filled 2012-05-06 (×3): qty 1

## 2012-05-06 NOTE — ED Notes (Signed)
Family at bedside. 

## 2012-05-06 NOTE — ED Provider Notes (Signed)
History     CSN: [id number]  Arrival date & time 05/06/12  1032   First MD Initiated Contact with Patient 05/06/12 1039      Chief Complaint  Patient presents with  . Rectal Bleeding    HPI  The patient presents with concerns of rectal bleeding.  He notes that until yesterday he was in his usual state of health.  Yesterday after a bowel movement he developed mild rectal bleeding, which slowed, and seemed to stop today, but recurred today after another bowel movements.  He states that he has had no pain throughout this time period, no lightheadedness, no dyspnea, fevers, no chills. He states that he has one of prior similar symptoms requiring colonic resection in the distant past.  Past Medical History  Diagnosis Date  . Hypercholesteremia   . Anxiety   . GERD (gastroesophageal reflux disease)   . Glaucoma   . Hernia   . Anemia     waldrens  . Esophageal tear   . Hypertension     sdr j.  pyrtle    no cardiac md  . DEMENTIA   . Constipation   . MI (myocardial infarction)      no cardiac md         >6 yrs ago    Past Surgical History  Procedure Date  . Cardiac surgery   . Appendectomy   . Hernia repair   . Colonoscopy 02/29/2012    Procedure: COLONOSCOPY;  Surgeon: Hart Carwin, MD;  Location: [facility];  Service: Endoscopy;  Laterality: N/A;  . Esophagus surgery     due to esophageal tear  . Small intestine surgery     20 yrs ago; in [place]  . Coronary artery bypass graft     > 6 yrs  . Kyphosis surgery     Family History  Problem Relation Age of Onset  . Colon cancer Neg Hx   . Malignant hyperthermia Neg Hx     History  Substance Use Topics  . Smoking status: Former Games developer  . Smokeless tobacco: Never Used  . Alcohol Use: No      Review of Systems  Constitutional:       Per HPI, otherwise negative  HENT:       Per HPI, otherwise negative  Eyes: Negative.   Respiratory:       Per HPI, otherwise negative  Cardiovascular:       Per HPI,  otherwise negative  Gastrointestinal: Negative for vomiting.  Genitourinary: Negative.   Musculoskeletal:       Per HPI, otherwise negative  Skin: Negative.   Neurological: Negative for syncope.    Allergies  Penicillins  Home Medications   Current Outpatient Rx  Name Route Sig Dispense Refill  . AMLODIPINE BESY-BENAZEPRIL HCL 5-40 MG PO CAPS Oral Take 1 capsule by mouth daily.    . ASPIRIN EC 81 MG PO TBEC Oral Take 81 mg by mouth daily.    Marland Kitchen BISOPROLOL-HYDROCHLOROTHIAZIDE 5-6.25 MG PO TABS Oral Take 1 tablet by mouth daily.    Marland Kitchen FLUTICASONE PROPIONATE 50 MCG/ACT NA SUSP Nasal Place 1 spray into the nose daily as needed. allergies    . LORAZEPAM 0.5 MG PO TABS Oral Take 0.5 mg by mouth at bedtime.    . ADULT MULTIVITAMIN W/MINERALS CH Oral Take 1 tablet by mouth daily.    Marland Kitchen NITROGLYCERIN 0.4 MG SL SUBL Sublingual Place 0.4 mg under the tongue every 5 (five) minutes as needed. Chest pain    .  OMEPRAZOLE 20 MG PO CPDR Oral Take 1 capsule (20 mg total) by mouth daily. 30 capsule 1  . PROMETHAZINE HCL 25 MG PO TABS Oral Take 25 mg by mouth every 6 (six) hours as needed. For nausea    . PROPYLENE GLYCOL 0.6 % OP SOLN Both Eyes Place 1-2 drops into both eyes 2 (two) times daily.     . SENNA-DOCUSATE SODIUM 8.6-50 MG PO TABS Oral Take 2 tablets by mouth daily. 60 tablet 3  . SIMVASTATIN 10 MG PO TABS Oral Take 1 tablet (10 mg total) by mouth at bedtime. 30 tablet 1  . TRAMADOL HCL 50 MG PO TABS Oral Take 1 tablet (50 mg total) by mouth every 8 (eight) hours as needed for pain. 30 tablet 0    BP 140/65  Pulse 78  Temp 98 F (36.7 C) (Oral)  Resp 16  SpO2 97%  Physical Exam  Nursing note and vitals reviewed. Constitutional: He is oriented to person, place, and time. He appears well-developed. No distress.  HENT:  Head: Normocephalic and atraumatic.  Eyes: Conjunctivae and EOM are normal.  Cardiovascular: Normal rate and regular rhythm.   Pulmonary/Chest: Effort normal. No  stridor. No respiratory distress.  Abdominal: He exhibits no distension.  Genitourinary: Rectal exam shows external hemorrhoid and internal hemorrhoid. Rectal exam shows no fissure, no mass, no tenderness and anal tone normal. Guaiac positive stool. Prostate is not tender.  Musculoskeletal: He exhibits no edema.  Neurological: He is alert and oriented to person, place, and time.  Skin: Skin is warm and dry.  Psychiatric: He has a normal mood and affect.    ED Course  Procedures (including critical care time)   Labs Reviewed  CBC WITH DIFFERENTIAL  COMPREHENSIVE METABOLIC PANEL  PROTIME-INR  TYPE AND SCREEN   No results found.   No diagnosis found.  cardiac77 sinus rhythm normal Pulse oximetry 98% normal   MDM  This elderly male presents with concerns of rectal bleeding.  On my exam the patient was in no distress, though he has active mild bleeding.  The patient's vital signs were largely reassuring, and his hemoglobin slightly down from his baseline.  Given the patient's age, comorbidities, the active bleeding he was admitted for further evaluation and management.  I discussed the case with both gastroenterology, and his admitting team, and he was admitted in stable condition.  Gerhard Munch, MD 05/06/12 1538

## 2012-05-06 NOTE — Progress Notes (Addendum)
Andre Mueller, is a 78 y.o. male,   MRN: [id number]  -  DOB - 1921/04/09  Outpatient Primary MD for the patient is Katy Apo, MD  in for    Chief Complaint  Patient presents with  . Rectal Bleeding     Blood pressure 140/65, pulse 78, temperature 98 F (36.7 C), temperature source Oral, resp. rate 16, SpO2 97.00%.  Principal Problem:  *GI (gastrointestinal bleed) Active Problems:  Constipation  History of resection of small bowel  GERD (gastroesophageal reflux disease)  Hypertension  Hyperlipidemia  CAD (coronary artery disease)  Glaucoma  Benign neoplasm of colon   78 yo male presents to ED with diverticular vs hemorroida rectal bleed. GI to see. Serial H&H, clear liquid, PPI. Hemodynamically stable.     [facility] patient, Dr. Nehemiah Settle. We will leave message notifying of admission.

## 2012-05-06 NOTE — ED Notes (Signed)
No distress noted.  Pt resting, wife at bedside.

## 2012-05-06 NOTE — Progress Notes (Signed)
PCP:   Katy Apo, MD   Chief Complaint:  Rectal bleed.   HPI: This is an 78 year old male, with known history of HTN, dyslipidemia, CAD, s/p MI, s/p CABG, GERD, upper GI bleed 10/2010 from esophagitis, esophageal stricture, s/p dilatation, esophageal perforation, s/p surgical repair, anxiety, right inguinal hernia, s/p appendectomy, diverticulosis, s/p colonoscopy 02/2012, which revealed adenomatous sigmoid polyps, internal hemorrhoids, presenting with above symptoms. Patient is a good historian, and according to him, at about 6)) AM on 05/05/12, he got up, went to the toilet and moved his bowels, following which he wiped, and saw bright red blood on the toilet paper, he had some oozing of blood from the anus, and had to apply pressure for a while, before this ceased. This AM at about 7:30, the same thing occurred, only this time, the blood wouldn't stop oozing. He became concerned, and called EMS. Patient denies abdominal pain, rectal pain , chest pain or vomiting.   Allergies:   Allergies  Allergen Reactions  . Penicillins Swelling      Past Medical History  Diagnosis Date  . Hypercholesteremia   . Anxiety   . GERD (gastroesophageal reflux disease)   . Glaucoma   . Hernia   . Anemia     waldrens  . Esophageal tear 1990's?  . Hypertension       no cardiac md  . DEMENTIA   . Constipation   . MI (myocardial infarction)      no cardiac md         >6 yrs ago    Past Surgical History  Procedure Date  . Cardiac surgery   . Appendectomy   . Hernia repair   . Colonoscopy 02/29/2012    Procedure: COLONOSCOPY;  Surgeon: Hart Carwin, MD;  Location: [facility];  Service: Endoscopy;  Laterality: N/A;  . Esophagus surgery     due to esophageal tear  . Small intestine surgery     20 yrs ago; in [place]  . Coronary artery bypass graft     > 6 yrs  . Kyphosis surgery     Prior to Admission medications   Medication Sig Start Date End Date Taking? Authorizing Provider    amLODipine-benazepril (LOTREL) 5-40 MG per capsule Take 1 capsule by mouth daily.   Yes Historical Provider, MD  aspirin EC 81 MG tablet Take 81 mg by mouth daily.   Yes Historical Provider, MD  bisoprolol-hydrochlorothiazide (ZIAC) 5-6.25 MG per tablet Take 1 tablet by mouth daily.   Yes Katy Apo, MD  fluticasone (FLONASE) 50 MCG/ACT nasal spray Place 1 spray into the nose daily as needed. allergies 02/04/12  Yes Historical Provider, MD  LORazepam (ATIVAN) 0.5 MG tablet Take 0.5 mg by mouth at bedtime.   Yes Historical Provider, MD  Multiple Vitamin (MULITIVITAMIN WITH MINERALS) TABS Take 1 tablet by mouth daily.   Yes Historical Provider, MD  nitroGLYCERIN (NITROSTAT) 0.4 MG SL tablet Place 0.4 mg under the tongue every 5 (five) minutes as needed. Chest pain   Yes Historical Provider, MD  omeprazole (PRILOSEC) 20 MG capsule Take 1 capsule (20 mg total) by mouth daily. 03/02/12  Yes Meredith Pel, NP  promethazine (PHENERGAN) 25 MG tablet Take 25 mg by mouth every 6 (six) hours as needed. For nausea   Yes Historical Provider, MD  Propylene Glycol (SYSTANE BALANCE) 0.6 % SOLN Place 1-2 drops into both eyes 2 (two) times daily.    Yes Historical Provider, MD  sennosides-docusate sodium (  SENOKOT-S) 8.6-50 MG tablet Take 2 tablets by mouth daily. 04/09/12 04/09/13 Yes Beverley Fiedler, MD  simvastatin (ZOCOR) 10 MG tablet Take 1 tablet (10 mg total) by mouth at bedtime. 03/02/12  Yes Meredith Pel, NP  traMADol (ULTRAM) 50 MG tablet Take 1 tablet (50 mg total) by mouth every 8 (eight) hours as needed for pain. 02/26/12 02/25/13 Yes Beverley Fiedler, MD    Social History: Married, resident of [place]  [facility], with his wife. He reports that he has quit smoking. He has never used smokeless tobacco. He reports that he does not drink alcohol or use illicit drugs. He has two offspring.  Family History  Problem Relation Age of Onset  . Colon cancer Neg Hx   . Malignant hyperthermia Neg Hx     Review  of Systems:  As per HPI and chief complaint. Patent denies fatigue, diminished appetite, weight loss, fever, chills, headache, blurred vision, difficulty in speaking, dysphagia, chest pain, cough, shortness of breath, orthopnea, paroxysmal nocturnal dyspnea, nausea, diaphoresis, abdominal pain, vomiting, diarrhea, belching, heartburn, hematemesis, melena, dysuria, nocturia, urinary frequency, lower extremity swelling, pain, or redness. The rest of the systems review is negative.  Physical Exam:  General:  Patient does not appear to be in obvious acute distress. Alert, communicative, fully oriented, talking in complete sentences, not short of breath at rest.  HEENT:  No clinical pallor, no jaundice, no conjunctival injection or discharge. Hydration status is fair.  NECK:  Supple, JVP not seen, no carotid bruits, no palpable lymphadenopathy, no palpable goiter. CHEST:  Median sternotomy scar is noted. Clinically clear to auscultation, no wheezes, no crackles. HEART:  Sounds 1 and 2 heard, normal, regular, no murmurs. ABDOMEN:  Full, soft, non-tender, no palpable organomegaly, no palpable masses, normal bowel sounds. Reducible right inguinal hernia is noted.  GENITALIA:  Not examined. LOWER EXTREMITIES:  No pitting edema, palpable peripheral pulses. MUSCULOSKELETAL SYSTEM:  Generalized osteoarthritic changes, otherwise, normal. CENTRAL NERVOUS SYSTEM:  No focal neurologic deficit on gross examination.  Labs on Admission:  Results for orders placed during the hospital encounter of 05/06/12 (from the past 48 hour(s))  CBC WITH DIFFERENTIAL     Status: Abnormal   Collection Time   05/06/12 11:00 AM      Component Value Range Comment   WBC 6.1  4.0 - 10.5 K/uL    RBC 3.91 (*) 4.22 - 5.81 MIL/uL    Hemoglobin 13.1  13.0 - 17.0 g/dL    HCT 16.1 (*) 09.6 - 52.0 %    MCV 94.1  78.0 - 100.0 fL    MCH 33.5  26.0 - 34.0 pg    MCHC 35.6  30.0 - 36.0 g/dL    RDW 04.5  40.9 - 81.1 %    Platelets 219  150  - 400 K/uL    Neutrophils Relative 62  43 - 77 %    Neutro Abs 3.8  1.7 - 7.7 K/uL    Lymphocytes Relative 26  12 - 46 %    Lymphs Abs 1.6  0.7 - 4.0 K/uL    Monocytes Relative 11  3 - 12 %    Monocytes Absolute 0.7  0.1 - 1.0 K/uL    Eosinophils Relative 1  0 - 5 %    Eosinophils Absolute 0.1  0.0 - 0.7 K/uL    Basophils Relative 0  0 - 1 %    Basophils Absolute 0.0  0.0 - 0.1 K/uL   COMPREHENSIVE METABOLIC PANEL  Status: Abnormal   Collection Time   05/06/12 11:00 AM      Component Value Range Comment   Sodium 135  135 - 145 mEq/L    Potassium 3.5  3.5 - 5.1 mEq/L    Chloride 99  96 - 112 mEq/L    CO2 24  19 - 32 mEq/L    Glucose, Bld 130 (*) 70 - 99 mg/dL    BUN 18  6 - 23 mg/dL    Creatinine, Ser 1.91  0.50 - 1.35 mg/dL    Calcium 9.3  8.4 - 47.8 mg/dL    Total Protein 9.1 (*) 6.0 - 8.3 g/dL    Albumin 3.3 (*) 3.5 - 5.2 g/dL    AST 15  0 - 37 U/L    ALT 10  0 - 53 U/L    Alkaline Phosphatase 64  39 - 117 U/L    Total Bilirubin 0.4  0.3 - 1.2 mg/dL    GFR calc non Af Amer 77 (*) >90 mL/min    GFR calc Af Amer 89 (*) >90 mL/min   PROTIME-INR     Status: Normal   Collection Time   05/06/12 11:00 AM      Component Value Range Comment   Prothrombin Time 14.7  11.6 - 15.2 seconds    INR 1.13  0.00 - 1.49   TYPE AND SCREEN     Status: Normal   Collection Time   05/06/12 11:10 AM      Component Value Range Comment   ABO/RH(D) O POS      Antibody Screen NEG      Sample Expiration 05/09/2012     ABO/RH     Status: Normal   Collection Time   05/06/12 11:10 AM      Component Value Range Comment   ABO/RH(D) O POS       Radiological Exams on Admission: *RADIOLOGY REPORT*  Clinical Data: Rectal bleeding and hypertension. CABG 5 years ago.  Ex-smoker.  PORTABLE CHEST - 1 VIEW  Comparison: 03/11/2012 and back to 11/21/2010.  Findings: Mild hyperinflation. Patient rotated to the left. Prior  median sternotomy. Skin fold over the right hemithorax.  Mild cardiomegaly with  atherosclerosis in the transverse aorta. No  pleural effusion or pneumothorax. Favor scarring at the left lung  base. Similar to 03/11/2012. No congestive failure.  IMPRESSION:  Cardiomegaly and left base scarring. No acute findings.  Original Report Authenticated By: Consuello Bossier, M.D.   Assessment/Plan Principal Problem:  *GI (gastrointestinal bleed): Patient presented with recurrent painless hematochezia, which was precipitated by wiping his anus, after bowel movement. He is s/p colonoscopy 02/2012 for obstipation, which revealed 2 large sigmoid colon adenomatous polyps, severe diverticulosis, and internal hemorrhoids. Clinically therefore, it appears that the culprit for his current symptoms, is hemorrhoidal bleed, and although diverticular bleed might be in the differential, it does seem unlikely. Dr Claudette Head has provided GI consultation, and has recommended observation, clear fluids, Anusol suppositoruies and serial CBC. Patient is currently hemodynamically stable, and HB is 13.1. Active Problems: 1. GERD (gastroesophageal reflux disease): Asymptomatic on PPI . Will continue.  2. Hypertension: Normotensive.  3. Hyperlipidemia: On statin. This will be continued.   4. CAD (coronary artery disease): Stable/Asymptomatic. ASA on hold, till GI bleed resolves.    Comment: Further management will depend on clinical course. Disposition will be determined by GI.   Time Spent on Admission: 40 mins.   Elizah Mierzwa,CHRISTOPHER 05/06/2012, 3:43 PM

## 2012-05-06 NOTE — Consult Note (Signed)
I have taken a history, examined the patient and reviewed the chart. I agree with the extender's note, impression and recommendations. Small volume hematochezia. Hemorrhoidal bleeding is the likely cause. Doubt diverticular bleeding but monitoring H/H and keeping on clear liquids overnight is prudent.  Meryl Dare MD [facility]

## 2012-05-06 NOTE — ED Notes (Signed)
Pt to department via EMS from [facility]- pt reports that he has some rectal bleeding last night that stopped and then started again this morning. Denies any pain. Bp- 162/88 Hr-80.

## 2012-05-06 NOTE — Consult Note (Signed)
Gastroenterology Consult: 1:24 PM 05/06/2012   Referring Provider: Dr Jeraldine Loots  Primary Care Physician:  Katy Apo, MD Primary Gastroenterologist:  Dr. Rhea Belton   Reason for Consultation:  Painless hematochezia.   HPI: Andre Mueller is a 78 y.o. male.  Hx obstipation requiring hospital admission in May 2013.  Colonoscopy then with 2 large sigmoid colon polyps (adenomatous), severe diverticulosis, and internal hemorrhoids. Had kyphoplasty during that same admission. Office follow up 04/09/12 with Dr Rhea Belton, constipation managed effectively with Sennekot.  Weight was down 7 # from April and he was still having back pain and had mild occasional nausea controlled with prn Zofran.  He was referred to an orthopedist, Dr Alveda Reasons for 6/27.  A CT scan in early 2013 has shown non-obstructing right inguinal hernia.  Yesterday after passing uneventful brown stool he started passing red blood PR.  It did not last long, the volume was maybe a palmful.  This AM, again following an easy to pass brown BM, he passed a larger amount of red blood and it occurred repeatedly until about 2 PM.  No pain in rectum, no straining to have BMs.  No nausea.  Back pain is better.  Weight loss has stableized.  Hgb is 13.1.  It was 13.0 to 14.5 in April 2013.   Pt had surgery on small intestine in the early 1990's for unclear reasons, wife said he had "blisters" on his bowel  as well as remote esophageal surgery to address esophageal tear that occurred when he was taking a colonoscopy prep.    REVIEW OF SYSTEMS: No anorexia, dysphagia.  No falls or dizzyness.  No visual problems.  No unusual bleeding or bruising, just the usual elderly person's purpura on arm, legs.  No extremity edema.  No palpitations or SOB.  Back pain is intermittent and better since he has been applying some sort of OTC mail order topical cream, he does not know name of this.  About one week ago, dr Kevan Ny started  him on what sounds like inhaled nasal steroid spray for a persistent, non-productive cough. No joint pains except the back and occasionally the knees.  No use of NSAIDs, just daily baby ASA.  Uses walker at his assisted living facility.  Lives with his wife, she has significant hearing loss.      Past Medical History  Diagnosis Date  . Hypercholesteremia   . Anxiety   . GERD (gastroesophageal reflux disease)   . Glaucoma   . Hernia   . Anemia     waldrens  . Esophageal tear   . Hypertension     sdr j.  pyrtle    no cardiac md  . DEMENTIA   . Constipation   . MI (myocardial infarction)      no cardiac md         >6 yrs ago    Past Surgical History  Procedure Date  . Cardiac surgery   . Appendectomy   . Hernia repair   . Colonoscopy 02/29/2012    Procedure: COLONOSCOPY;  Surgeon: Hart Carwin, MD;  Location: [facility];  Service: Endoscopy;  Laterality: N/A;  . Esophagus surgery     due to esophageal tear  . Small intestine surgery     20 yrs ago; in [place]  . Coronary artery bypass graft     > 6 yrs  . Kyphosis surgery     Prior to Admission medications   Medication Sig Start Date End Date Taking? Authorizing Provider  amLODipine-benazepril (LOTREL) 5-40 MG per capsule Take 1 capsule by mouth daily.   Yes Historical Provider, MD  aspirin EC 81 MG tablet Take 81 mg by mouth daily.   Yes Historical Provider, MD  bisoprolol-hydrochlorothiazide (ZIAC) 5-6.25 MG per tablet Take 1 tablet by mouth daily.   Yes Katy Apo, MD  fluticasone (FLONASE) 50 MCG/ACT nasal spray Place 1 spray into the nose daily as needed. allergies 02/04/12  Yes Historical Provider, MD  LORazepam (ATIVAN) 0.5 MG tablet Take 0.5 mg by mouth at bedtime.   Yes Historical Provider, MD  Multiple Vitamin (MULITIVITAMIN WITH MINERALS) TABS Take 1 tablet by mouth daily.   Yes Historical Provider, MD  nitroGLYCERIN (NITROSTAT) 0.4 MG SL tablet Place 0.4 mg under the tongue every 5 (five) minutes as needed.  Chest pain   Yes Historical Provider, MD  omeprazole (PRILOSEC) 20 MG capsule Take 1 capsule (20 mg total) by mouth daily. 03/02/12  Yes Meredith Pel, NP  promethazine (PHENERGAN) 25 MG tablet Take 25 mg by mouth every 6 (six) hours as needed. For nausea   Yes Historical Provider, MD  Propylene Glycol (SYSTANE BALANCE) 0.6 % SOLN Place 1-2 drops into both eyes 2 (two) times daily.    Yes Historical Provider, MD  sennosides-docusate sodium (SENOKOT-S) 8.6-50 MG tablet Take 2 tablets by mouth daily. 04/09/12 04/09/13 Yes Beverley Fiedler, MD  simvastatin (ZOCOR) 10 MG tablet Take 1 tablet (10 mg total) by mouth at bedtime. 03/02/12  Yes Meredith Pel, NP  traMADol (ULTRAM) 50 MG tablet Take 1 tablet (50 mg total) by mouth every 8 (eight) hours as needed for pain. 02/26/12 02/25/13 Yes Beverley Fiedler, MD    Scheduled Meds:    . sodium chloride   Intravenous STAT  . pantoprazole (PROTONIX) IV  80 mg Intravenous Once  . sodium chloride  1,000 mL Intravenous Once   Infusions:   PRN Meds:    Allergies as of 05/06/2012 - Review Complete 05/06/2012  Allergen Reaction Noted  . Penicillins Swelling 02/14/2012    Family History  Problem Relation Age of Onset  . Colon cancer Neg Hx   . Malignant hyperthermia Neg Hx     History   Social History  . Marital Status: Married    Spouse Name: N/A    Number of Children: 2  . Years of Education: N/A   Occupational History  . RETIRED    Social History Main Topics  . Smoking status: Former Games developer  . Smokeless tobacco: Never Used  . Alcohol Use: No  . Drug Use: No  . Sexually Active: Not on file   Other Topics Concern  . Not on file   Social History Narrative   Daily caffeine     PHYSICAL EXAM: Vital signs in last 24 hours: Temp:  [98 F (36.7 C)] 98 F (36.7 C) (07/09 1041) Pulse Rate:  [78] 78  (07/09 1041) Resp:  [16] 16  (07/09 1041) BP: (140)/(65) 140/65 mmHg (07/09 1041) SpO2:  [97 %] 97 % (07/09 1041)  General: Aged,  pleasant, non-acutely ill appearing wm Head:  No facial edema, no facial edema  Eyes:  Slight ectropion in lower lid Ears:  Slightly HOH  Nose:  No discharge or congestion. Mouth:  Full dentures.  No lesions.  Moist MM.  Neck:  Clear B.  No bruits or TMG. Lungs:  Clear B.  No SOB, no cough Heart: RRR.  No MRG Abdomen:  Soft, NT, ND.  No HSM.  Right inguinal hernia is non-tender.   Rectal: red blood with palpable and visible internal hemorrhoids.  Beyond the hemorrhoids is scant amount of brown stool.   Musc/Skeltl: no joint deformity, swelling, erythema. Extremities:  No pedal edema.  Feet are warm, 2 to 3 plus pedal pulses.   Neurologic:  No tremor.  Not confused.  Grossly non-focal exam. Skin:  No rash. Tattoos:  none Nodes:  No adenopathy    Psych:  Pleasant, cooperative. No agitation or anxiety.    LAB RESULTS:  Basename 05/06/12 1100  WBC 6.1  HGB 13.1  HCT 36.8*  PLT 219   BMET Lab Results  Component Value Date   NA 135 05/06/2012   NA 133* 03/11/2012   NA 134* 02/20/2012   K 3.5 05/06/2012   K 4.1 03/11/2012   K 3.2* 02/20/2012   CL 99 05/06/2012   CL 99 03/11/2012   CL 99 02/20/2012   CO2 24 05/06/2012   CO2 20 03/11/2012   CO2 23 02/20/2012   GLUCOSE 130* 05/06/2012   GLUCOSE 104* 03/11/2012   GLUCOSE 157* 02/20/2012   BUN 18 05/06/2012   BUN 14 03/11/2012   BUN 22 02/20/2012   CREATININE 0.82 05/06/2012   CREATININE 0.77 03/11/2012   CREATININE 0.91 02/20/2012   CALCIUM 9.3 05/06/2012   CALCIUM 9.6 03/11/2012   CALCIUM 8.7 02/20/2012   LFT  Basename 05/06/12 1100  PROT 9.1*  ALBUMIN 3.3*  AST 15  ALT 10  ALKPHOS 64  BILITOT 0.4  BILIDIR --  IBILI --   PT/INR Lab Results  Component Value Date   INR 1.13 05/06/2012   INR 1.05 03/11/2012   Hepatitis Panel No results found for this basename: HEPBSAG,HCVAB,HEPAIGM,HEPBIGM in the last 72 hours   RADIOLOGY STUDIES: Dg Chest Port 1 View 05/06/2012  *RADIOLOGY REPORT*  Clinical Data: Rectal bleeding and hypertension.  CABG  5 years ago. Ex-smoker.  PORTABLE CHEST - 1 VIEW  Comparison: 03/11/2012 and back to 11/21/2010.  Findings: Mild hyperinflation.  Patient rotated to the left. Prior median sternotomy.  Skin fold over the right hemithorax.  Mild cardiomegaly with atherosclerosis in the transverse aorta. No pleural effusion or pneumothorax.  Favor scarring at the left lung base.  Similar to 03/11/2012. No congestive failure.  IMPRESSION: Cardiomegaly and left base scarring. No acute findings.  Original Report Authenticated By: Consuello Bossier, M.D.    ENDOSCOPIC STUDIES: 02/29/2012  Colonoscopy  Lina Sar for constipation eval, this was his first and only colonoscopy ENDOSCOPIC IMPRESSION:  1) Two polyps in the sigmoid colon  2) Severe diverticulosis in the sigmoid colon  3) Otherwise normal examination  4) Internal hemorrhoids  2 large pedunculated polyps with ulcerated surface and fresh  blood, removed with snare  RECOMMENDATIONS:  1) Await pathology results  bowl regimen of Miralax, fiber, Dulcolox tabs  advance diet  Anusol HC supp  10/2010    EGD   Dr Dulce Sellar for CG emesis Esophagitis. Schatski's ring, hiatal hernia.    IMPRESSION: *  Painless lower GIB.  source is either hemorrhoids or diverticular,  I suspect the former. No signs of hemodynamic instability.    PLAN: *  Observation, serial CBC, q 12 hour ought to be sufficient *  Add Anusol HC suppositories. *  Not sure he needs clear liquid diet, but this is what is ordered.  *  Does not need Protonix IV, continuing daily oral PPI is sufficient.    LOS: 0 days   Jennye Moccasin  05/06/2012,  1:24 PM Pager: [phone]

## 2012-05-06 NOTE — ED Notes (Signed)
MD at bedside. 

## 2012-05-07 LAB — COMPREHENSIVE METABOLIC PANEL
Albumin: 3.1 g/dL — ABNORMAL LOW (ref 3.5–5.2)
BUN: 11 mg/dL (ref 6–23)
Calcium: 9.1 mg/dL (ref 8.4–10.5)
GFR calc Af Amer: >90 mL/min (ref >90)
Glucose, Bld: 91 mg/dL (ref 70–99)
Total Protein: 8.5 g/dL — ABNORMAL HIGH (ref 6.0–8.3)

## 2012-05-07 LAB — MRSA PCR SCREENING: MRSA by PCR: NEGATIVE

## 2012-05-07 LAB — HEMOGLOBIN AND HEMATOCRIT, BLOOD: Hemoglobin: 13.1 g/dL (ref 13.0–17.0)

## 2012-05-07 MED ORDER — POTASSIUM CHLORIDE CRYS ER 20 MEQ PO TBCR
20.0000 meq | EXTENDED_RELEASE_TABLET | Freq: Once | ORAL | Status: AC
  Administered 2012-05-07: 20 meq via ORAL
  Filled 2012-05-07: qty 1

## 2012-05-07 NOTE — Care Management Note (Signed)
    Page 1 of 1   05/08/2012     1:11:32 PM   CARE MANAGEMENT NOTE 05/08/2012  Patient:  Andre Mueller, Andre Mueller   Account Number:  [id number]  Date Initiated:  05/06/2012  Documentation initiated by:  Letha Cape  Subjective/Objective Assessment:   dx gib  admit-lives with spouse at [facility].  pta independent.     Action/Plan:   pt eval- rec hhpt   Anticipated DC Date:  05/08/2012   Anticipated DC Plan:  HOME W HOME HEALTH SERVICES      DC Planning Services  CM consult      [facility] Choice  HOME HEALTH   Choice offered to / List presented to:  C-1 Patient        HH arranged  HH - 11 Patient Refused      Status of service:  Completed, signed off Medicare Important Message given?   (If response is "NO", the following Medicare IM given date fields will be blank) Date Medicare IM given:   Date Additional Medicare IM given:    Discharge Disposition:  HOME/SELF CARE  Per UR Regulation:  Reviewed for med. necessity/level of care/duration of stay  If discussed at Long Length of Stay Meetings, dates discussed:    Comments:  05/08/12 13;10 Letha Cape RN, BSN [phone] patient dc home today, patient refused hhpt when offered recs .  [name] son came transported him home.  05/06/12 18:24 Letha Cape RN, BSN [phone] patient lives with spouse at [facility].  Await pt eval.  NCM will continue to follow for dc needs.

## 2012-05-07 NOTE — Progress Notes (Signed)
Subjective: Admission H&P has been reviewed. Apparently patient admitted after having blood per rectum on 2 occasions, there is no complaint of pain. Hemoglobin has remained fairly stable. There is no complaint of abdominal pain, no recurrent bleeding. At this time plans are to resume regular diet, monitor for stability if no recurrent bleeding discharge  Objective: Vital signs in last 24 hours: Temp:  [97.4 F (36.3 C)-98.1 F (36.7 C)] 97.9 F (36.6 C) (07/10 0649) Pulse Rate:  [66-78] 69  (07/10 0649) Resp:  [16-28] 22  (07/10 0649) BP: (129-164)/(54-77) 152/77 mmHg (07/10 0649) SpO2:  [96 %-97 %] 96 % (07/10 0649) Weight:  [75.1 kg (165 lb 9.1 oz)] 75.1 kg (165 lb 9.1 oz) (07/09 1742) Weight change:  Last BM Date: 05/06/12  Intake/Output from previous day: 07/09 0701 - 07/10 0700 In: 60 [P.O.:60] Out: 930 [Urine:930] Intake/Output this shift:    General appearance: alert and cooperative Resp: clear to auscultation bilaterally Cardio: regular rate and rhythm, S1, S2 normal, no murmur, click, rub or gallop GI: soft, non-tender; bowel sounds normal; no masses,  no organomegaly  Lab Results:  Results for orders placed during the hospital encounter of 05/06/12 (from the past 24 hour(s))  CBC WITH DIFFERENTIAL     Status: Abnormal   Collection Time   05/06/12 11:00 AM      Component Value Range   WBC 6.1  4.0 - 10.5 K/uL   RBC 3.91 (*) 4.22 - 5.81 MIL/uL   Hemoglobin 13.1  13.0 - 17.0 g/dL   HCT 16.1 (*) 09.6 - 04.5 %   MCV 94.1  78.0 - 100.0 fL   MCH 33.5  26.0 - 34.0 pg   MCHC 35.6  30.0 - 36.0 g/dL   RDW 40.9  81.1 - 91.4 %   Platelets 219  150 - 400 K/uL   Neutrophils Relative 62  43 - 77 %   Neutro Abs 3.8  1.7 - 7.7 K/uL   Lymphocytes Relative 26  12 - 46 %   Lymphs Abs 1.6  0.7 - 4.0 K/uL   Monocytes Relative 11  3 - 12 %   Monocytes Absolute 0.7  0.1 - 1.0 K/uL   Eosinophils Relative 1  0 - 5 %   Eosinophils Absolute 0.1  0.0 - 0.7 K/uL   Basophils Relative 0   0 - 1 %   Basophils Absolute 0.0  0.0 - 0.1 K/uL  COMPREHENSIVE METABOLIC PANEL     Status: Abnormal   Collection Time   05/06/12 11:00 AM      Component Value Range   Sodium 135  135 - 145 mEq/L   Potassium 3.5  3.5 - 5.1 mEq/L   Chloride 99  96 - 112 mEq/L   CO2 24  19 - 32 mEq/L   Glucose, Bld 130 (*) 70 - 99 mg/dL   BUN 18  6 - 23 mg/dL   Creatinine, Ser 7.82  0.50 - 1.35 mg/dL   Calcium 9.3  8.4 - 95.6 mg/dL   Total Protein 9.1 (*) 6.0 - 8.3 g/dL   Albumin 3.3 (*) 3.5 - 5.2 g/dL   AST 15  0 - 37 U/L   ALT 10  0 - 53 U/L   Alkaline Phosphatase 64  39 - 117 U/L   Total Bilirubin 0.4  0.3 - 1.2 mg/dL   GFR calc non Af Amer 77 (*) >90 mL/min   GFR calc Af Amer 89 (*) >90 mL/min  PROTIME-INR  Status: Normal   Collection Time   05/06/12 11:00 AM      Component Value Range   Prothrombin Time 14.7  11.6 - 15.2 seconds   INR 1.13  0.00 - 1.49  TYPE AND SCREEN     Status: Normal   Collection Time   05/06/12 11:10 AM      Component Value Range   ABO/RH(D) O POS     Antibody Screen NEG     Sample Expiration 05/09/2012    ABO/RH     Status: Normal   Collection Time   05/06/12 11:10 AM      Component Value Range   ABO/RH(D) O POS    HEMOGLOBIN AND HEMATOCRIT, BLOOD     Status: Abnormal   Collection Time   05/06/12  4:23 PM      Component Value Range   Hemoglobin 12.7 (*) 13.0 - 17.0 g/dL   HCT 16.1 (*) 09.6 - 04.5 %  MRSA PCR SCREENING     Status: Normal   Collection Time   05/07/12  3:13 AM      Component Value Range   MRSA by PCR NEGATIVE  NEGATIVE  HEMOGLOBIN AND HEMATOCRIT, BLOOD     Status: Abnormal   Collection Time   05/07/12  6:10 AM      Component Value Range   Hemoglobin 13.1  13.0 - 17.0 g/dL   HCT 40.9 (*) 81.1 - 91.4 %  COMPREHENSIVE METABOLIC PANEL     Status: Abnormal   Collection Time   05/07/12  6:10 AM      Component Value Range   Sodium 136  135 - 145 mEq/L   Potassium 3.4 (*) 3.5 - 5.1 mEq/L   Chloride 101  96 - 112 mEq/L   CO2 22  19 - 32 mEq/L    Glucose, Bld 91  70 - 99 mg/dL   BUN 11  6 - 23 mg/dL   Creatinine, Ser 7.82  0.50 - 1.35 mg/dL   Calcium 9.1  8.4 - 95.6 mg/dL   Total Protein 8.5 (*) 6.0 - 8.3 g/dL   Albumin 3.1 (*) 3.5 - 5.2 g/dL   AST 16  0 - 37 U/L   ALT 9  0 - 53 U/L   Alkaline Phosphatase 57  39 - 117 U/L   Total Bilirubin 0.8  0.3 - 1.2 mg/dL   GFR calc non Af Amer 83 (*) >90 mL/min   GFR calc Af Amer >90  >90 mL/min      Studies/Results: Dg Chest Port 1 View  05/06/2012  *RADIOLOGY REPORT*  Clinical Data: Rectal bleeding and hypertension.  CABG 5 years ago. Ex-smoker.  PORTABLE CHEST - 1 VIEW  Comparison: 03/11/2012 and back to 11/21/2010.  Findings: Mild hyperinflation.  Patient rotated to the left. Prior median sternotomy.  Skin fold over the right hemithorax.  Mild cardiomegaly with atherosclerosis in the transverse aorta. No pleural effusion or pneumothorax.  Favor scarring at the left lung base.  Similar to 03/11/2012. No congestive failure.  IMPRESSION: Cardiomegaly and left base scarring. No acute findings.  Original Report Authenticated By: Consuello Bossier, M.D.    Medications:  Prior to Admission:  Prescriptions prior to admission  Medication Sig Dispense Refill  . amLODipine-benazepril (LOTREL) 5-40 MG per capsule Take 1 capsule by mouth daily.      Marland Kitchen aspirin EC 81 MG tablet Take 81 mg by mouth daily.      . bisoprolol-hydrochlorothiazide (ZIAC) 5-6.25 MG per tablet Take  1 tablet by mouth daily.      . fluticasone (FLONASE) 50 MCG/ACT nasal spray Place 1 spray into the nose daily as needed. allergies      . LORazepam (ATIVAN) 0.5 MG tablet Take 0.5 mg by mouth at bedtime.      . Multiple Vitamin (MULITIVITAMIN WITH MINERALS) TABS Take 1 tablet by mouth daily.      . nitroGLYCERIN (NITROSTAT) 0.4 MG SL tablet Place 0.4 mg under the tongue every 5 (five) minutes as needed. Chest pain      . omeprazole (PRILOSEC) 20 MG capsule Take 1 capsule (20 mg total) by mouth daily.  30 capsule  1  . promethazine  (PHENERGAN) 25 MG tablet Take 25 mg by mouth every 6 (six) hours as needed. For nausea      . Propylene Glycol (SYSTANE BALANCE) 0.6 % SOLN Place 1-2 drops into both eyes 2 (two) times daily.       . sennosides-docusate sodium (SENOKOT-S) 8.6-50 MG tablet Take 2 tablets by mouth daily.  60 tablet  3  . simvastatin (ZOCOR) 10 MG tablet Take 1 tablet (10 mg total) by mouth at bedtime.  30 tablet  1  . traMADol (ULTRAM) 50 MG tablet Take 1 tablet (50 mg total) by mouth every 8 (eight) hours as needed for pain.  30 tablet  0   Scheduled:   . amLODipine  5 mg Oral Daily   And  . benazepril  40 mg Oral Daily  . bisoprolol-hydrochlorothiazide  1 tablet Oral Daily  . hydrocortisone  25 mg Rectal BID  . LORazepam  0.5 mg Oral QHS  . multivitamin with minerals  1 tablet Oral Daily  . pantoprazole (PROTONIX) IV  80 mg Intravenous Once  . pantoprazole  40 mg Oral Q0600  . polyvinyl alcohol  1-2 drop Both Eyes BID  . senna-docusate  2 tablet Oral QHS  . simvastatin  10 mg Oral QHS  . sodium chloride  1,000 mL Intravenous Once  . DISCONTD: sodium chloride   Intravenous STAT  . DISCONTD: amLODipine-benazepril  1 capsule Oral Daily  . DISCONTD: Propylene Glycol  1-2 drop Both Eyes BID  . DISCONTD: sennosides-docusate sodium  2 tablet Oral Daily  . DISCONTD: sodium chloride  3 mL Intravenous Q12H   Continuous:   Assessment/Plan: @HPROL @  Principal Problem:  *GI (gastrointestinal bleed) Active Problems:  Constipation  History of resection of small bowel  GERD (gastroesophageal reflux disease)  Hypertension  Hyperlipidemia  CAD (coronary artery disease)  Glaucoma  Benign neoplasm of colon  Blood in stool  Diverticulosis of colon (without mention of hemorrhage)  As discussed above advanced diet, saline lock IV fluids, disposition pending tolerance of diet and no recurrent bleeding  LOS: 1 day   Nicol Herbig D 05/07/2012, 8:30 AM

## 2012-05-07 NOTE — Progress Notes (Signed)
I have taken an interval history, reviewed the chart and examined the patient. I agree with the extender's note, impression and recommendations. This is most likely hemorrhoidal bleeding. His Hb has been stable. Continue Anusol HC suppositories for several days. We will sign off. OP GI follow up as needed with Dr. Erick Blinks.  Venita Lick. Russella Dar MD Clementeen Graham

## 2012-05-07 NOTE — Progress Notes (Signed)
     [facility] Daily Rounding Note 05/07/2012, 8:51 AM  SUBJECTIVE:       No further bleeding or stools.  Feels well.  Confesses that he feels conflicted, thinks that MDs are at odds as to diagnoses and treatment of the LGIB.  Dr Nehemiah Settle has been to see pt this AM, advanced diet to regular.  Has gotten one Anusol HC PR so far.    OBJECTIVE:         Vital signs in last 24 hours:    Temp:  [97.4 F (36.3 C)-98.1 F (36.7 C)] 97.9 F (36.6 C) (07/10 0649) Pulse Rate:  [66-78] 69  (07/10 0649) Resp:  [16-28] 22  (07/10 0649) BP: (129-164)/(54-77) 152/77 mmHg (07/10 0649) SpO2:  [96 %-97 %] 96 % (07/10 0649) Weight:  [165 lb 9.1 oz (75.1 kg)] 165 lb 9.1 oz (75.1 kg) (07/09 1742) Last BM Date: 05/06/12 General: elderly, frail   Heart: RRR Chest: clear B.  Slight dyspnea with speach Abdomen: soft, NT, ND.  Active BS  Extremities: no pedal edema Neuro/Psych:  Pleasant, not confused but anxious.  Moves all 4s.   Intake/Output from previous day: 07/09 0701 - 07/10 0700 In: 60 [P.O.:60] Out: 930 [Urine:930]  Intake/Output this shift:    Lab Results:  Basename 05/07/12 0610 05/06/12 1623 05/06/12 1100  WBC -- -- 6.1  HGB 13.1 12.7* 13.1  HCT 35.8* 34.9* 36.8*  PLT -- -- 219   BMET  Basename 05/07/12 0610 05/06/12 1100  NA 136 135  K 3.4* 3.5  CL 101 99  CO2 22 24  GLUCOSE 91 130*  BUN 11 18  CREATININE 0.68 0.82  CALCIUM 9.1 9.3   LFT  Basename 05/07/12 0610 05/06/12 1100  PROT 8.5* 9.1*  ALBUMIN 3.1* 3.3*  AST 16 15  ALT 9 10  ALKPHOS 57 64  BILITOT 0.8 0.4  BILIDIR -- --  IBILI -- --   PT/INR  Basename 05/06/12 1100  LABPROT 14.7  INR 1.13   ASSESMENT: *  Painless lower GIB. source is either hemorrhoids or diverticular, suspect the former. No signs of hemodynamic instability.  H & H stable.      PLAN: *  Agree with solid diet *  Could go home today.  Should continue Anusol PR for another 3 days, then assume prn use if recurrent bleeding.  *  In case  he stays, I changed CBC to the AM. *  Restart 81 mg ASA in one week.  *  Out pt GI follow up PRN   LOS: 1 day   Jennye Moccasin  05/07/2012, 8:51 AM Pager: [phone]

## 2012-05-07 NOTE — Progress Notes (Signed)
UR COMPLETED  

## 2012-05-08 LAB — HEMOGLOBIN AND HEMATOCRIT, BLOOD
HCT: 35.5 % — ABNORMAL LOW (ref 39.0–52.0)
Hemoglobin: 12.8 g/dL — ABNORMAL LOW (ref 13.0–17.0)

## 2012-05-08 MED ORDER — ASPIRIN EC 81 MG PO TBEC: 81.0000 mg | DELAYED_RELEASE_TABLET | Freq: Every day | ORAL | Status: DC

## 2012-05-08 NOTE — Progress Notes (Signed)
PT Note  PT eval complete, full note to follow. Pt presents close to his baseline mobility level. Would recommend HHPT for him but pt may decline this. No further acute PT needs at this time.   Ivonne Andrew PT, DPT  Pager: [phone]

## 2012-05-08 NOTE — Evaluation (Signed)
Physical Therapy Evaluation Patient Details Name: Andre Mueller MRN: [id number] DOB: Nov 02, 1920 Today's Date: 05/08/2012 Time: [id number] PT Time Calculation (min): 13 min  PT Assessment / Plan / Recommendation Clinical Impression  Andre Mueller is 78 y/o male admitted for GIB. Presents to physical therapy today at baseline mobility. No acute needs identifed but would benefit from HHPT vs OPPT for continued strengthening and balance education. Pt doesn't feel he needs this. No further acute PT needs PT signing off.     PT Assessment  Patent does not need any further PT services    Follow Up Recommendations  Outpatient PT;Home health PT    Barriers to Discharge        Equipment Recommendations  Rolling walker with 5" wheels    Recommendations for Other Services     Frequency      Precautions / Restrictions           Mobility  Bed Mobility Bed Mobility: Supine to Sit Supine to Sit: 6: Modified independent (Device/Increase time) Transfers Transfers: Sit to Stand;Stand to Sit Sit to Stand: 6: Modified independent (Device/Increase time);From bed;With upper extremity assist Stand to Sit: 6: Modified independent (Device/Increase time);To bed;With upper extremity assist Ambulation/Gait Ambulation/Gait Assistance: 5: Supervision Ambulation Distance (Feet): 250 Feet Assistive device: Rolling walker Ambulation/Gait Assistance Details: slower gait speed with decreased step height and length, downward gaze, 1 cue for safe positioning within RW, closer guarding with turns as speed decreased  Gait Pattern: Trunk flexed    Exercises     PT Diagnosis:    PT Problem List:   PT Treatment Interventions:     PT Goals    Visit Information  Last PT Received On: 05/08/12 Assistance Needed: +1    Subjective Data  Subjective: I don't think I need physical therapy.    Prior Functioning  Home Living Lives With: Spouse Available Help at Discharge: Family;Available 24 hours/day Type of  Home: Independent living facility Home Access: Elevator Home Layout: One level Bathroom Shower/Tub: Health visitor: Handicapped height Home Adaptive Equipment: Environmental consultant - four wheeled;Grab bars in shower;Grab bars around toilet Prior Function Level of Independence: Independent with assistive device(s) Vocation: Retired Musician: No difficulties    Cognition  Overall Cognitive Status: Appears within functional limits for tasks assessed/performed Arousal/Alertness: Awake/alert Orientation Level: Appears intact for tasks assessed Behavior During Session: [facility] for tasks performed    Extremity/Trunk Assessment Right Upper Extremity Assessment RUE ROM/Strength/Tone: Within functional levels RUE Sensation: WFL - Light Touch;WFL - Proprioception RUE Coordination: WFL - gross/fine motor Left Upper Extremity Assessment LUE ROM/Strength/Tone: Within functional levels LUE Sensation: WFL - Proprioception;WFL - Light Touch LUE Coordination: WFL - gross/fine motor Right Lower Extremity Assessment RLE ROM/Strength/Tone: WFL for tasks assessed RLE Sensation: WFL - Light Touch;WFL - Proprioception RLE Coordination: WFL - gross/fine motor Left Lower Extremity Assessment LLE ROM/Strength/Tone: WFL for tasks assessed LLE Sensation: WFL - Light Touch;WFL - Proprioception LLE Coordination: WFL - gross/fine motor Trunk Assessment Trunk Assessment: Kyphotic   Balance    End of Session PT - End of Session Equipment Utilized During Treatment: Gait belt Activity Tolerance: Patient tolerated treatment well Patient left: in bed;with call bell/phone within reach Nurse Communication: Mobility status  GP Functional Limitation: Mobility: Walking and moving around Mobility: Walking and Moving Around Current Status (E9528): At least 1 percent but less than 20 percent impaired, limited or restricted Mobility: Walking and Moving Around Goal Status [phone]): 0 percent impaired,  limited or restricted Mobility: Walking and Moving Around Discharge  Status [phone]): At least 1 percent but less than 20 percent impaired, limited or restricted   [name] 05/08/2012, 2:02 PM

## 2012-05-08 NOTE — Discharge Summary (Signed)
Physician Discharge Summary  Patient ID: Icarus Partch MRN: [id number] DOB/AGE: 1921-04-18 77 y.o.  Admit date: 05/06/2012 Discharge date: 05/08/2012  Admission Diagnoses: GI bleed lower Discharge Diagnoses:  Principal Problem:  *GI (gastrointestinal bleed) Active Problems:  Constipation  History of resection of small bowel  GERD (gastroesophageal reflux disease)  Hypertension  Hyperlipidemia  CAD (coronary artery disease)  Glaucoma  Benign neoplasm of colon  Blood in stool  Diverticulosis of colon (without mention of hemorrhage) hypokalemia  Discharged Condition: stable  Hospital Course:  Patient presented to the hospital after having bright red blood per rectum on 2 occasions. There was no other associated symptom. Admission was deemed necessary for further evaluation and treatment. Please see dictated H&P for further details. Past medical history medications social history past surgical history allergies family history per admission H&P. Hospital course patient was admitted to a telemetry floor bed for evaluation and treatment of GI bleed, he was seen in consultation by GI. Of note patient has had a colonoscopy in the past which showed diverticulosis. His description of his bleeding appeared to be diverticular in etiology. Since hospitalized there has been no recurrent bleeding, no nausea no vomiting no abdominal pain and his hemoglobin has remained stable, there was no need for transfusion. He is felt to be medically stable for discharge. He will hold his aspirin for one week. He will have an outpatient followup with primary M.D. In one week  Consults:   [facility] Significant Diagnostic Studies:Dg Chest Port 1 View Discharge hemoglobin 12.8 05/06/2012  *RADIOLOGY REPORT*  Clinical Data: Rectal bleeding and hypertension.  CABG 5 years ago. Ex-smoker.  PORTABLE CHEST - 1 VIEW  Comparison: 03/11/2012 and back to 11/21/2010.  Findings: Mild hyperinflation.  Patient rotated to the left.  Prior median sternotomy.  Skin fold over the right hemithorax.  Mild cardiomegaly with atherosclerosis in the transverse aorta. No pleural effusion or pneumothorax.  Favor scarring at the left lung base.  Similar to 03/11/2012. No congestive failure.  IMPRESSION: Cardiomegaly and left base scarring. No acute findings.  Original Report Authenticated By: Consuello Bossier, M.D.      Discharge Exam: Blood pressure 136/75, pulse 63, temperature 98.1 F (36.7 C), temperature source Oral, resp. rate 16, height 5\' 10"  (1.778 m), weight 75.1 kg (165 lb 9.1 oz), SpO2 94.00%. General appearance: alert and cooperative Resp: clear to auscultation bilaterally Cardio: regular rate and rhythm, S1, S2 normal, no murmur, click, rub or gallop GI: soft, non-tender; bowel sounds normal; no masses,  no organomegaly  Disposition: 01-Home or Self Care  Discharge Orders    Future Appointments: Provider: Department: Dept Phone: Center:   08/21/2012 9:30 AM Newt Lukes, MD [facility] [phone] [facility]     Medication List  As of 05/08/2012  9:17 AM   TAKE these medications         amLODipine-benazepril 5-40 MG per capsule   Commonly known as: LOTREL   Take 1 capsule by mouth daily.      aspirin EC 81 MG tablet   Take 1 tablet (81 mg total) by mouth daily.      bisoprolol-hydrochlorothiazide 5-6.25 MG per tablet   Commonly known as: ZIAC   Take 1 tablet by mouth daily.      fluticasone 50 MCG/ACT nasal spray   Commonly known as: FLONASE   Place 1 spray into the nose daily as needed. allergies      LORazepam 0.5 MG tablet   Commonly known as: ATIVAN   Take 0.5  mg by mouth at bedtime.      multivitamin with minerals Tabs   Take 1 tablet by mouth daily.      nitroGLYCERIN 0.4 MG SL tablet   Commonly known as: NITROSTAT   Place 0.4 mg under the tongue every 5 (five) minutes as needed. Chest pain      omeprazole 20 MG capsule   Commonly known as: PRILOSEC   Take 1 capsule (20 mg total) by mouth  daily.      promethazine 25 MG tablet   Commonly known as: PHENERGAN   Take 25 mg by mouth every 6 (six) hours as needed. For nausea      sennosides-docusate sodium 8.6-50 MG tablet   Commonly known as: SENOKOT-S   Take 2 tablets by mouth daily.      simvastatin 10 MG tablet   Commonly known as: ZOCOR   Take 1 tablet (10 mg total) by mouth at bedtime.      SYSTANE BALANCE 0.6 % Soln   Generic drug: Propylene Glycol   Place 1-2 drops into both eyes 2 (two) times daily.      traMADol 50 MG tablet   Commonly known as: ULTRAM   Take 1 tablet (50 mg total) by mouth every 8 (eight) hours as needed for pain.           Follow-up Information    Follow up with Teriann Livingood D, MD in 1 week.   Contact information:   [address] [place] [address] [phone]          Signed: Katy Apo 05/08/2012, 9:17 AM

## 2012-05-08 NOTE — Progress Notes (Signed)
Patient discharge teaching given, including activity, diet, follow-up appoints, and medications. Patient verbalized understanding of all discharge instructions. IV access was d/c'd. Vitals are stable. Skin is intact except as charted in most recent assessments. Pt to be escorted out by NT, to be driven home by family. 

## 2012-05-19 ENCOUNTER — Other Ambulatory Visit (HOSPITAL_COMMUNITY): Payer: Self-pay | Admitting: Nurse Practitioner

## 2012-08-21 ENCOUNTER — Ambulatory Visit (INDEPENDENT_AMBULATORY_CARE_PROVIDER_SITE_OTHER)
Admission: RE | Admit: 2012-08-21 | Discharge: 2012-08-21 | Disposition: A | Discharge status: Home / Self Care | Payer: Medicare Other | Source: Ambulatory Visit | Attending: Internal Medicine | Admitting: Internal Medicine

## 2012-08-21 ENCOUNTER — Ambulatory Visit (INDEPENDENT_AMBULATORY_CARE_PROVIDER_SITE_OTHER): Payer: Medicare Other | Admitting: Internal Medicine

## 2012-08-21 ENCOUNTER — Other Ambulatory Visit (INDEPENDENT_AMBULATORY_CARE_PROVIDER_SITE_OTHER): Payer: Medicare Other

## 2012-08-21 ENCOUNTER — Encounter: Payer: Self-pay | Admitting: Internal Medicine

## 2012-08-21 VITALS — BP 120/62 | HR 72 | Temp 96.7°F | Ht 70.0 in | Wt 159.1 lb

## 2012-08-21 DIAGNOSIS — I10 Essential (primary) hypertension: Secondary | ICD-10-CM

## 2012-08-21 DIAGNOSIS — M810 Age-related osteoporosis without current pathological fracture: Secondary | ICD-10-CM | POA: Insufficient documentation

## 2012-08-21 DIAGNOSIS — E785 Hyperlipidemia, unspecified: Secondary | ICD-10-CM

## 2012-08-21 DIAGNOSIS — I2510 Atherosclerotic heart disease of native coronary artery without angina pectoris: Secondary | ICD-10-CM

## 2012-08-21 DIAGNOSIS — K922 Gastrointestinal hemorrhage, unspecified: Secondary | ICD-10-CM

## 2012-08-21 LAB — LIPID PANEL
Cholesterol: 97 mg/dL (ref 0–200)
LDL Cholesterol: 54 mg/dL (ref 0–99)
Total CHOL/HDL Ratio: 3

## 2012-08-21 NOTE — Progress Notes (Signed)
Subjective:    Patient ID: Andre Mueller, male    DOB: November 18, 1920, 78 y.o.   MRN: [id number]  HPI New pt to me and [facility] division, though known to [facility], here to establish with PCP Reviewed chronic medical issues today:  Chronic back pain - exacerbated by T11 compression fx 02/2012, s/p KP by Nsurg for same- pain has been slow to improve, but denies worsening of this pain or new/increase pain- denies falls or other fracture - never formally dx with osteoporosis or on meds for same  CAD - CABG hx mid 2000s in [place] area - denies angina, claudication or dyspnea on exertion symptoms; does not follow with cards since moving to [place] - the patient reports compliance with medication(s) as prescribed. Denies adverse side effects.  GI issues - chronic constipation - LGIB from presumed diverticular source 04/2012 - no evidence of clinical recurrence per pt - no nausea and vomiting, no appetite or weight changes - takes PPI for GERD symptoms   hypertension - the patient reports compliance with medication(s) as prescribed. Denies adverse side effects.   Past Medical History  Diagnosis Date  . Hypercholesteremia   . Anxiety   . GERD (gastroesophageal reflux disease)   . Glaucoma(365)   . Esophageal tear 1990's?  . Hypertension   . Constipation   . Cancer     hx of waldrens disease  . Benign neoplasm of colon   . CAD (coronary artery disease) 2007    s/p CABG  . Diverticulosis of colon (without mention of hemorrhage)     hx LGIB, 04/2012 hosp  . History of resection of small bowel   . Osteoporosis, senile     hx T11 compression fx 02/2012, s/p KP   Family History  Problem Relation Age of Onset  . Colon cancer Neg Hx   . Malignant hyperthermia Neg Hx    History  Substance Use Topics  . Smoking status: Former Smoker    Quit date: 09/19/1962  . 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 Alcohol Use: No    Review of Systems Constitutional: Negative for fever or weight change.  Respiratory: Negative for cough and shortness of breath.   Cardiovascular: Negative for chest pain or palpitations.  Gastrointestinal: Negative for abdominal pain, no bowel changes.  Musculoskeletal: Negative for joint swelling.  Skin: Negative for rash.  Neurological: Negative for dizziness or headache.  No other specific complaints in a complete review of systems (except as listed in HPI above).     Objective:   Physical Exam BP 120/62  Pulse 72  Temp 96.7 F (35.9 C) (Oral)  Ht 5\' 10"  (1.778 m)  Wt 159 lb 1.9 oz (72.176 kg)  BMI 22.83 kg/m2  SpO2 94% Wt Readings from Last 3 Encounters:  08/21/12 159 lb 1.9 oz (72.176 kg)  05/06/12 165 lb 9.1 oz (75.1 kg)  04/09/12 163 lb (73.936 kg)   Constitutional:  He is elderly and frail, walks slowly with RW, very kyphotic but pleasant and no distress.  HENT: NCAT, OP clear Eyes: vision grossly intact, PERRL. No icterus or injection Neck: Normal range of motion. Neck supple. No JVD present. No thyromegaly present.  Cardiovascular: Normal rate, regular rhythm and normal heart sounds.  No murmur heard. no  BLE edema Pulmonary/Chest: Effort normal and breath sounds normal. No respiratory distress. no wheezes.  Abdominal: Soft. Bowel sounds are normal. Patient exhibits no distension. There is no tenderness.  Musculoskeletal: Kyphotic posture. No vertebral tenderness to palpation. No joint effusions in hands, knees or feet. Neurological: he is alert and oriented to person, place, and time. No cranial nerve deficit. Coordination, speech normal. Balance and gait aided with RW. Short term memory problems with occasional need for redirection Skin: Senile changes. skin is warm and dry.  No erythema or ulceration.  Psychiatric: he has a normal mood and  affect. behavior is normal. Judgment and thought content normal.   Lab Results  Component Value Date   WBC 6.1 05/06/2012   HGB 12.8* 05/08/2012   HCT 35.5* 05/08/2012   PLT 219 05/06/2012   GLUCOSE 91 05/07/2012   ALT 9 05/07/2012   AST 16 05/07/2012   NA 136 05/07/2012   K 3.4* 05/07/2012   CL 101 05/07/2012   CREATININE 0.68 05/07/2012   BUN 11 05/07/2012   CO2 22 05/07/2012   TSH 2.410 03/01/2012   INR 1.13 05/06/2012       Assessment & Plan:  See problem list. Medications and labs reviewed today.  Time spent with pt today 45 minutes, greater than 50% time spent counseling patient on back pain with hx compression fx 02/2912 and suspected osteoporosis, LGIB 04/2012, CAD hx, constipation and medication review. Also review of prior records

## 2012-08-21 NOTE — Assessment & Plan Note (Signed)
Check DEXA and consider Prolia or Reclast therapy given hx T11 compression fx 02/2012

## 2012-08-21 NOTE — Assessment & Plan Note (Signed)
On low dose statin, hx CAD s/p CABG Check lipids now, adjust if needed

## 2012-08-21 NOTE — Assessment & Plan Note (Signed)
Diverticular bleed 04/2012 - hospitalization for same reviewed [place] 02/2012: 2 large benign polyps and severe diverticulosis No evidence for recurrence, remains on ASA 81 qd for CAD hx The current medical regimen is effective;  continue present plan and medications.

## 2012-08-21 NOTE — Assessment & Plan Note (Signed)
2v CABG mid2000 in [place] area per pt Med mgmt as ongoing, no anginal symptoms

## 2012-08-21 NOTE — Patient Instructions (Signed)
It was good to see you today. Test(s) ordered today. Your results will be released to MyChart (or called to you) after review, usually within 72hours after test completion. If any changes need to be made, you will be notified at that same time. Medications reviewed, no changes at this time. Please schedule followup in 6 months, call sooner if problems.

## 2012-08-21 NOTE — Assessment & Plan Note (Signed)
BP Readings from Last 3 Encounters:  08/21/12 120/62  05/08/12 129/72  04/09/12 132/64   The current medical regimen is effective;  continue present plan and medications.

## 2012-08-30 ENCOUNTER — Other Ambulatory Visit (HOSPITAL_COMMUNITY): Payer: Self-pay | Admitting: Nurse Practitioner

## 2012-09-01 ENCOUNTER — Encounter: Payer: Self-pay | Admitting: Internal Medicine

## 2013-02-19 ENCOUNTER — Ambulatory Visit: Payer: Medicare Other | Admitting: Internal Medicine

## 2013-03-04 ENCOUNTER — Observation Stay (HOSPITAL_COMMUNITY)
Admission: EM | Admit: 2013-03-04 | Discharge: 2013-03-05 | Disposition: A | Discharge status: Home / Self Care | Payer: Medicare Other | Attending: Internal Medicine | Admitting: Internal Medicine

## 2013-03-04 ENCOUNTER — Emergency Department (HOSPITAL_COMMUNITY): Payer: Medicare Other

## 2013-03-04 ENCOUNTER — Encounter (HOSPITAL_COMMUNITY): Payer: Self-pay | Admitting: Emergency Medicine

## 2013-03-04 DIAGNOSIS — R29898 Other symptoms and signs involving the musculoskeletal system: Principal | ICD-10-CM | POA: Insufficient documentation

## 2013-03-04 DIAGNOSIS — R531 Weakness: Secondary | ICD-10-CM | POA: Diagnosis present

## 2013-03-04 DIAGNOSIS — E7800 Pure hypercholesterolemia, unspecified: Secondary | ICD-10-CM | POA: Insufficient documentation

## 2013-03-04 DIAGNOSIS — R079 Chest pain, unspecified: Secondary | ICD-10-CM | POA: Insufficient documentation

## 2013-03-04 DIAGNOSIS — I10 Essential (primary) hypertension: Secondary | ICD-10-CM | POA: Diagnosis present

## 2013-03-04 DIAGNOSIS — K219 Gastro-esophageal reflux disease without esophagitis: Secondary | ICD-10-CM

## 2013-03-04 DIAGNOSIS — D649 Anemia, unspecified: Secondary | ICD-10-CM | POA: Diagnosis present

## 2013-03-04 DIAGNOSIS — J90 Pleural effusion, not elsewhere classified: Secondary | ICD-10-CM | POA: Insufficient documentation

## 2013-03-04 DIAGNOSIS — I2510 Atherosclerotic heart disease of native coronary artery without angina pectoris: Secondary | ICD-10-CM | POA: Diagnosis present

## 2013-03-04 DIAGNOSIS — H409 Unspecified glaucoma: Secondary | ICD-10-CM

## 2013-03-04 DIAGNOSIS — E785 Hyperlipidemia, unspecified: Secondary | ICD-10-CM | POA: Diagnosis present

## 2013-03-04 DIAGNOSIS — R5383 Other fatigue: Secondary | ICD-10-CM

## 2013-03-04 DIAGNOSIS — F411 Generalized anxiety disorder: Secondary | ICD-10-CM | POA: Insufficient documentation

## 2013-03-04 LAB — COMPREHENSIVE METABOLIC PANEL
ALT: 16 U/L (ref 0–53)
AST: 29 U/L (ref 0–37)
Albumin: 3.1 g/dL — ABNORMAL LOW (ref 3.5–5.2)
Alkaline Phosphatase: 71 U/L (ref 39–117)
BUN: 16 mg/dL (ref 6–23)
Chloride: 101 mEq/L (ref 96–112)
Potassium: 3.3 mEq/L — ABNORMAL LOW (ref 3.5–5.1)
Total Bilirubin: 0.5 mg/dL (ref 0.3–1.2)

## 2013-03-04 LAB — POCT I-STAT, CHEM 8
BUN: 16 mg/dL (ref 6–23)
Calcium, Ion: 1.11 mmol/L — ABNORMAL LOW (ref 1.13–1.30)
Calcium, Ion: 1.12 mmol/L — ABNORMAL LOW (ref 1.13–1.30)
Chloride: 105 mEq/L (ref 96–112)
Chloride: 105 mEq/L (ref 96–112)
Creatinine, Ser: 0.7 mg/dL (ref 0.50–1.35)
Glucose, Bld: 137 mg/dL — ABNORMAL HIGH (ref 70–99)
Glucose, Bld: 125 mg/dL — ABNORMAL HIGH (ref 70–99)
Potassium: 3.3 mEq/L — ABNORMAL LOW (ref 3.5–5.1)
Potassium: 3.2 mEq/L — ABNORMAL LOW (ref 3.5–5.1)

## 2013-03-04 LAB — CBC WITH DIFFERENTIAL/PLATELET
Basophils Absolute: 0 10*3/uL (ref 0.0–0.1)
Eosinophils Absolute: 0 10*3/uL (ref 0.0–0.7)
Eosinophils Relative: 0 % (ref 0–5)
MCH: 33.6 pg (ref 26.0–34.0)
MCHC: 36.8 g/dL — ABNORMAL HIGH (ref 30.0–36.0)
MCV: 91.5 fL (ref 78.0–100.0)
Platelets: 165 10*3/uL (ref 150–400)
RDW: 13.5 % (ref 11.5–15.5)

## 2013-03-04 LAB — URINALYSIS, ROUTINE W REFLEX MICROSCOPIC
Bilirubin Urine: NEGATIVE
Ketones, ur: 15 mg/dL — AB
Leukocytes, UA: NEGATIVE
Nitrite: NEGATIVE
Protein, ur: NEGATIVE mg/dL

## 2013-03-04 LAB — APTT: aPTT: 36 seconds (ref 24–37)

## 2013-03-04 NOTE — ED Provider Notes (Signed)
History     CSN: [id number]  Arrival date & time 03/04/13  2033   First MD Initiated Contact with Patient 03/04/13 2052      Chief Complaint  Patient presents with  . Extremity Weakness    Bilateral Leg Weakness    (Consider location/radiation/quality/duration/timing/severity/associated sxs/prior treatment) HPI Comments: 79 year old male with a history of generalized weakness which has been growing over the last month. The patient had a fall approximately one month ago because of this weakness but has improved back to his baseline. He was able to go to lunch today, he was ambulatory today but while he was taking a shower this evening he felt like his legs were giving out, weakness in both legs, lowered himself to the floor of the shower and was helped up by family. He has had significant difficulty and going since that time. He denies back pain, neck pain, abdominal pain or chest pain. He denies fevers or chills but has had nausea earlier today and a cough which is gradually worsening. He denies any unilateral symptoms, everything is bilateral and it seems to be more the legs and arms. No dysuria, no diarrhea, no rashes, no history of workup for this weakness in the past. The son who presents with his father states that he has had significant difficulty getting in and out of the chair and currently he lives in a independent living community, lives with his spouse but is unable to aid in his or her care because of his weakness.  Patient is a 79 y.o. male presenting with extremity weakness. The history is provided by the patient and a relative.  Extremity Weakness    Past Medical History  Diagnosis Date  . Hypercholesteremia   . Anxiety   . GERD (gastroesophageal reflux disease)   . Glaucoma(365)   . Esophageal tear 1990's?  . Hypertension   . Constipation   . Cancer     hx of waldrens disease  . Benign neoplasm of colon 02/2012    polyps x 2 adenomatous w/o high grade dysplasia  .  CAD (coronary artery disease) 2007    s/p CABG in [place]  . Diverticulosis of colon (without mention of hemorrhage)     hx LGIB, 04/2012 hosp  . History of resection of small bowel   . Osteoporosis, senile     hx T11 compression fx 02/2012, s/p KP    Past Surgical History  Procedure Laterality Date  . Cardiac surgery    . Appendectomy    . Hernia repair    . Colonoscopy  02/29/2012    Procedure: COLONOSCOPY;  Surgeon: Hart Carwin, MD;  Location: [facility];  Service: Endoscopy;  Laterality: N/A;  . Esophagus surgery      due to esophageal tear  . Small intestine surgery      20 yrs ago; in [place]  . Coronary artery bypass graft      > 6 yrs  . Kyphosis surgery      Family History  Problem Relation Age of Onset  . Colon cancer Neg Hx   . Malignant hyperthermia Neg Hx     History  Substance Use Topics  . Smoking status: Former Smoker    Quit date: 09/19/1962  . 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 Alcohol Use: No      Review of Systems  Musculoskeletal: Positive for extremity weakness.  All other systems reviewed and are negative.    Allergies  Penicillins  Home Medications   Current Outpatient Rx  Name  Route  Sig  Dispense  Refill  . amLODipine-benazepril (LOTREL) 5-40 MG per capsule   Oral   Take 1 capsule by mouth daily.         Marland Kitchen aspirin EC 81 MG tablet   Oral   Take 1 tablet (81 mg total) by mouth daily.           Stop aspirin x 1 week, resume 05/16/12   . bisoprolol-hydrochlorothiazide (ZIAC) 5-6.25 MG per tablet   Oral   Take 1 tablet by mouth daily.         . fluticasone (FLONASE) 50 MCG/ACT nasal spray   Nasal   Place 1 spray into the nose daily as needed. allergies         . furosemide (LASIX) 20 MG tablet    Oral   Take 20 mg by mouth daily as needed (edema).         . Multiple Vitamin (MULITIVITAMIN WITH MINERALS) TABS   Oral   Take 1 tablet by mouth daily.         . nitroGLYCERIN (NITROSTAT) 0.4 MG SL tablet   Sublingual   Place 0.4 mg under the tongue every 5 (five) minutes as needed. Chest pain         . omeprazole (PRILOSEC) 20 MG capsule      TAKE 1 CAPSULE (20 MG TOTAL) BY MOUTH DAILY.   30 capsule   3     CYCLE FILL MEDICATION. Authorization is required f ...   . promethazine (PHENERGAN) 25 MG tablet   Oral   Take 25 mg by mouth every 6 (six) hours as needed. For nausea         . simvastatin (ZOCOR) 10 MG tablet   Oral   Take 1 tablet (10 mg total) by mouth at bedtime.   30 tablet   1     BP 137/55  Pulse 85  Temp(Src) 99.2 F (37.3 C) (Oral)  Resp 21  SpO2 95%  Physical Exam  Nursing note and vitals reviewed. Constitutional: He appears well-developed and well-nourished. No distress.  HENT:  Head: Normocephalic and atraumatic.  Mouth/Throat: Oropharynx is clear and moist. No oropharyngeal exudate.  Eyes: Conjunctivae and EOM are normal. Pupils are equal, round, and reactive to light. Right eye exhibits no discharge. Left eye exhibits no discharge. No scleral icterus.  Neck: Normal range of motion. Neck supple. No JVD present. No thyromegaly present.  Cardiovascular: Normal rate, regular rhythm, normal heart sounds and intact distal pulses.  Exam reveals no gallop and no friction rub.   No murmur heard. Pulmonary/Chest: Effort normal and breath sounds normal. No respiratory distress. He has no wheezes. He has no rales.  Abdominal: Soft. Bowel sounds are normal. He exhibits no distension and no mass. There is no tenderness.  Musculoskeletal: Normal range of motion. He exhibits no edema and no tenderness.  Lymphadenopathy:    He has no cervical adenopathy.  Neurological: He is alert. Coordination normal.  Speech clear, pupils equal round reactive to  light, extraocular movements intact  Normal peripheral visual fields Cranial nerves III through XII normal including no facial droop Follows commands, moves all extremities x4, normal strength to bilateral upper and lower extremities  at all major muscle groups including grip Sensation normal to light touch and pinprick Coordination intact, no limb ataxia, finger-nose-finger normal Rapid alternating movements normal No pronator drift    Skin: Skin is warm and dry. No rash noted. No erythema.  Psychiatric: He has a normal mood and affect. His behavior is normal.    ED Course  Procedures (including critical care time)  Labs Reviewed  COMPREHENSIVE METABOLIC PANEL  CBC WITH DIFFERENTIAL  URINALYSIS, ROUTINE W REFLEX MICROSCOPIC  TROPONIN I  PROTIME-INR  APTT   No results found.   No diagnosis found.    MDM  At this time the patient has essentially normal vital signs though he does feel warm to the touch. His lungs are clear, oxygen is mid 90% and his neurologic exam shows no focal unilateral symptoms. I will get a CT scan of his brain but will also include a infectious workup, metabolic workup. He will likely need to be admitted for further evaluation and possible placement.  ED ECG REPORT  I personally interpreted this EKG   Date: 03/04/2013   Rate: 83  Rhythm: normal sinus rhythm  QRS Axis: normal  Intervals: normal  ST/T Wave abnormalities: normal  Conduction Disutrbances:none  Narrative Interpretation:   Old EKG Reviewed: c/w 02/14/12 - no sig changes   D/w Dr. Toniann Fail - no acute findings on labs - stable for admission.    Vida Roller, MD 03/04/13 [phone]

## 2013-03-04 NOTE — ED Notes (Signed)
Pt presents w/ bilateral lower extremity weakness. Pt is a/o x4 and neuro intact. Pt has good 2+ peripheral pulses and perfusion. Pt denies n/v. Pt has no sensory deficit on lower extremities. Pt's only complaint is a "slight headache." Pt denies LOC or injury to head during episode at home.

## 2013-03-04 NOTE — ED Notes (Signed)
Patient c/o bilateral leg weakness intermittent x1 month. Pt fell April 5th because of the same. Pt went to lunch today and was ambulatory, had sudden onset of weakness at lunch bilaterally to legs approximately 12pm. Pt showered today, leaned back and slid down the side of the shower, no head injury, no LOC and staff at [facility] [employer] called EMS. Pt is AAOx4. Pt denies any complaints besides knee weakness. Pt has hx of MI with atypical symtoms. NSR on monitor.

## 2013-03-05 DIAGNOSIS — K219 Gastro-esophageal reflux disease without esophagitis: Secondary | ICD-10-CM

## 2013-03-05 DIAGNOSIS — E785 Hyperlipidemia, unspecified: Secondary | ICD-10-CM

## 2013-03-05 DIAGNOSIS — H409 Unspecified glaucoma: Secondary | ICD-10-CM

## 2013-03-05 LAB — CBC
HCT: 31.5 % — ABNORMAL LOW (ref 39.0–52.0)
MCH: 33.6 pg (ref 26.0–34.0)
MCHC: 36.8 g/dL — ABNORMAL HIGH (ref 30.0–36.0)
MCV: 91.3 fL (ref 78.0–100.0)
RDW: 13.5 % (ref 11.5–15.5)

## 2013-03-05 LAB — BASIC METABOLIC PANEL
BUN: 14 mg/dL (ref 6–23)
CO2: 22 mEq/L (ref 19–32)
Calcium: 8.6 mg/dL (ref 8.4–10.5)
Chloride: 101 mEq/L (ref 96–112)
Creatinine, Ser: 0.67 mg/dL (ref 0.50–1.35)
Glucose, Bld: 125 mg/dL — ABNORMAL HIGH (ref 70–99)

## 2013-03-05 MED ORDER — AMLODIPINE BESYLATE 5 MG PO TABS
5.0000 mg | ORAL_TABLET | Freq: Every day | ORAL | Status: DC
  Administered 2013-03-05: 5 mg via ORAL
  Filled 2013-03-05: qty 1

## 2013-03-05 MED ORDER — ONDANSETRON HCL 4 MG PO TABS: 4.0000 mg | ORAL_TABLET | Freq: Four times a day (QID) | ORAL | Status: DC | PRN

## 2013-03-05 MED ORDER — BISOPROLOL FUMARATE 5 MG PO TABS
5.0000 mg | ORAL_TABLET | Freq: Every day | ORAL | Status: DC
  Administered 2013-03-05: 5 mg via ORAL
  Filled 2013-03-05: qty 1

## 2013-03-05 MED ORDER — SIMVASTATIN 10 MG PO TABS
10.0000 mg | ORAL_TABLET | Freq: Every day | ORAL | Status: DC
  Administered 2013-03-05: 10 mg via ORAL
  Filled 2013-03-05 (×2): qty 1

## 2013-03-05 MED ORDER — PANTOPRAZOLE SODIUM 40 MG PO TBEC
40.0000 mg | DELAYED_RELEASE_TABLET | Freq: Every day | ORAL | Status: DC
  Administered 2013-03-05: 40 mg via ORAL
  Filled 2013-03-05: qty 1

## 2013-03-05 MED ORDER — NITROGLYCERIN 0.4 MG SL SUBL: 0.4000 mg | SUBLINGUAL_TABLET | SUBLINGUAL | Status: DC | PRN

## 2013-03-05 MED ORDER — SODIUM CHLORIDE 0.9 % IJ SOLN
3.0000 mL | Freq: Two times a day (BID) | INTRAMUSCULAR | Status: DC
  Administered 2013-03-05: 3 mL via INTRAVENOUS

## 2013-03-05 MED ORDER — ACETAMINOPHEN 650 MG RE SUPP: 650.0000 mg | Freq: Four times a day (QID) | RECTAL | Status: DC | PRN

## 2013-03-05 MED ORDER — ONDANSETRON HCL 4 MG/2ML IJ SOLN: 4.0000 mg | Freq: Three times a day (TID) | INTRAMUSCULAR | Status: DC | PRN

## 2013-03-05 MED ORDER — ENOXAPARIN SODIUM 40 MG/0.4ML ~~LOC~~ SOLN
40.0000 mg | Freq: Every day | SUBCUTANEOUS | Status: DC
  Administered 2013-03-05: 40 mg via SUBCUTANEOUS
  Filled 2013-03-05: qty 0.4

## 2013-03-05 MED ORDER — ONDANSETRON HCL 4 MG/2ML IJ SOLN: 4.0000 mg | Freq: Four times a day (QID) | INTRAMUSCULAR | Status: DC | PRN

## 2013-03-05 MED ORDER — BENAZEPRIL HCL 40 MG PO TABS
40.0000 mg | ORAL_TABLET | Freq: Every day | ORAL | Status: DC
Start: 2013-03-05 — End: 2013-03-05
  Administered 2013-03-05: 40 mg via ORAL
  Filled 2013-03-05: qty 1

## 2013-03-05 MED ORDER — AMLODIPINE BESY-BENAZEPRIL HCL 5-40 MG PO CAPS: 1.0000 | ORAL_CAPSULE | Freq: Every day | ORAL | Status: DC

## 2013-03-05 MED ORDER — ACETAMINOPHEN 325 MG PO TABS: 650.0000 mg | ORAL_TABLET | Freq: Four times a day (QID) | ORAL | Status: DC | PRN

## 2013-03-05 MED ORDER — FLUTICASONE PROPIONATE 50 MCG/ACT NA SUSP: 1.0000 | Freq: Every day | NASAL | Status: DC | PRN

## 2013-03-05 MED ORDER — ASPIRIN EC 81 MG PO TBEC
81.0000 mg | DELAYED_RELEASE_TABLET | Freq: Every day | ORAL | Status: DC
Start: 2013-03-05 — End: 2013-03-05
  Administered 2013-03-05: 81 mg via ORAL
  Filled 2013-03-05: qty 1

## 2013-03-05 NOTE — Discharge Summary (Signed)
Physician Discharge Summary  Andre Mueller ZOX:[id number] DOB: 03/14/1921 DOA: 03/04/2013  PCP: Rene Paci, MD  Admit date: 03/04/2013 Discharge date: 03/05/2013  Time spent: >30 minutes  Recommendations for Outpatient Follow-up:  1. Please consider check B12, TSH and Vit D (if not recently check) 2. Repeat BMET to follow electrolytes  3. Reassess BP and adjust medications as needed  Discharge Diagnoses:  Principal Problem:   Weakness Active Problems:   Hypertension   Hyperlipidemia   CAD (coronary artery disease)   Anemia   Discharge Condition: stable and improved. Arrangements for [facility] services will be provided. Follow up with PCP in 2 weeks  Diet recommendation: heart healthy diet  Filed Weights   03/05/13 0200 03/05/13 0409  Weight: 71.804 kg (158 lb 4.8 oz) 71.668 kg (158 lb)    History of present illness:  79 y.o. male with history of CAD status post CABG, hypertension, hyperlipidemia and T11 compression fracture status post kyphoplasty in 2013 presents with increasing weakness from the independent living facility. Patient states that he usually use a walker and over the last few months patient has been getting increasingly weak. Last evening patient had a shower and after which patient felt that his legs were giving way and he had to sit down. After which he felt very weak to stand and had to be helped. He found it difficult to walk. He was brought to the ER. In the ER he was found to be nonfocal. But given the increased generalized weakness and need for further help. Patient denies any chest pain shortness of breath abdominal pain diarrhea. He did have mild nausea. Denies any fever or chills.   Hospital Course:  1-Generalized weakness: secondary to overall deconditioning and debility due to age and most likely Osteoporosis/OA. -outpatient evaluation of B12, TSH and Vit D recommended if not recently done -[facility] services including PT/OT/RN and private care giver for assistance  and conditioning. -good hydration and PO intake encouraged -patient lasix discontinued.  2-CAD: no CP, no acute EKG changes in ED. Will continue ASA, statins and B-blocker  3-HTN: stable. Will continue current regimen except for discontinuation of lasix. Advise to follow heart healthy diet  4-HLD: continue statins  5-Anemia: Hgb 11.2; stable and no signs of bleeding.  *Rest of medical problems remains stable and the plan is for outpatient follow up and to continue current medication regimen.    Procedures: See below for x-ray  Consultations:  PT  Discharge Exam: Filed Vitals:   03/05/13 0207 03/05/13 0210 03/05/13 0409 03/05/13 0543  BP: 122/68 132/69  133/73  Pulse: 83 90  74  Temp:    97.6 F (36.4 C)  TempSrc:    Oral  Resp:    20  Height:      Weight:   71.668 kg (158 lb)   SpO2:    96%    General: NAD, afebrile, feeling ok; just overall weak. Cardiovascular: S1 and S2, no rubs or gallops Respiratory: CTA bilaterally MSK: no edema or cyanosis; no joint swelling or pain.  Discharge Instructions  Discharge Orders   Future Orders Complete By Expires     Diet - low sodium heart healthy  As directed     Discharge instructions  As directed     Comments:      Follow up with PCP in 2 weeks Take medications as prescribed Keep yourself hydrated and follow instructions and recommendations from Physical Therapy/Occupational Therapy and [facility] Nurse    Increase activity slowly  As directed  Medication List    STOP taking these medications       furosemide 20 MG tablet  Commonly known as:  LASIX      TAKE these medications       amLODipine-benazepril 5-40 MG per capsule  Commonly known as:  LOTREL  Take 1 capsule by mouth daily.     aspirin EC 81 MG tablet  Take 1 tablet (81 mg total) by mouth daily.     bisoprolol-hydrochlorothiazide 5-6.25 MG per tablet  Commonly known as:  ZIAC  Take 1 tablet by mouth daily.     fluticasone 50 MCG/ACT nasal  spray  Commonly known as:  FLONASE  Place 1 spray into the nose daily as needed. allergies     multivitamin with minerals Tabs  Take 1 tablet by mouth daily.     nitroGLYCERIN 0.4 MG SL tablet  Commonly known as:  NITROSTAT  Place 0.4 mg under the tongue every 5 (five) minutes as needed. Chest pain     omeprazole 20 MG capsule  Commonly known as:  PRILOSEC  TAKE 1 CAPSULE (20 MG TOTAL) BY MOUTH DAILY.     promethazine 25 MG tablet  Commonly known as:  PHENERGAN  Take 25 mg by mouth every 6 (six) hours as needed. For nausea     simvastatin 10 MG tablet  Commonly known as:  ZOCOR  Take 1 tablet (10 mg total) by mouth at bedtime.       Allergies  Allergen Reactions  . Penicillins Swelling       Follow-up Information   Follow up with Rene Paci, MD. Schedule an appointment as soon as possible for a visit in 2 days.   Contact information:   [address] [place] [address] [phone]        The results of significant diagnostics from this hospitalization (including imaging, microbiology, ancillary and laboratory) are listed below for reference.    Significant Diagnostic Studies: Dg Chest Port 1 View  03/04/2013  *RADIOLOGY REPORT*  Clinical Data: 79 year old male chest pain.  Weakness and weight loss.  PORTABLE CHEST - 1 VIEW  Comparison: 05/06/2012 and earlier.  Findings: Portable AP semi upright view 2139 hours.  Stable lung volumes.  Stable cardiac size and mediastinal contours.  Sequelae of CABG.  Pulmonary vascularity is not significantly changed and there is no overt pulmonary edema.  No pneumothorax.  Possible chronic small left pleural effusion or pleural scarring.  No consolidation.  Sequelae of lower thoracic kyphoplasty or vertebroplasty.  IMPRESSION: No acute cardiopulmonary abnormality.  Chronic small left pleural effusion or pleural scarring.   Original Report Authenticated By: Erskine Speed, M.D.    Labs: Basic Metabolic  Panel:  Recent Labs Lab 03/04/13 2112 03/04/13 2132 03/04/13 2308 03/05/13 0430  NA 135 139 139 135  K 3.3* 3.3* 3.2* 3.3*  CL 101 105 105 101  CO2 23  --   --  22  GLUCOSE 130* 137* 125* 125*  BUN 16 16 16 14   CREATININE 0.72 0.70 0.70 0.67  CALCIUM 9.1  --   --  8.6   Liver Function Tests:  Recent Labs Lab 03/04/13 2112  AST 29  ALT 16  ALKPHOS 71  BILITOT 0.5  PROT 8.4*  ALBUMIN 3.1*   CBC:  Recent Labs Lab 03/04/13 2112 03/04/13 2132 03/04/13 2308 03/05/13 0430  WBC 9.0  --   --  7.7  NEUTROABS 7.1  --   --   --  HGB 11.8* 11.9* 11.9* 11.6*  HCT 32.1* 35.0* 35.0* 31.5*  MCV 91.5  --   --  91.3  PLT 165  --   --  168   Cardiac Enzymes:  Recent Labs Lab 03/04/13 2112  TROPONINI <0.30     Signed:  Rodell Marrs  [facility] 03/05/2013, 11:22 AM

## 2013-03-05 NOTE — Progress Notes (Signed)
Pt arrived to unit from ED. Pt is A&O, VS stable, & skin intact. Pt is currently resting comfortably in bed. Will continue to monitor.

## 2013-03-05 NOTE — H&P (Signed)
[facility] History and Physical  Govind Furey WUJ:[id number] DOB: [birth year] DOA: 03/04/2013  Referring physician: Dr. Hyacinth Meeker. PCP: Rene Paci, MD.  Chief Complaint: Weakness.  HPI: Andre Mueller is a 79 y.o. male with history of CAD status post CABG, hypertension, hyperlipidemia and T11 compression fracture status post kyphoplasty in 2013 presents with increasing weakness from the independent living facility. Patient states that he usually use a walker and over the last few months patient has been getting increasingly weak. Last evening patient had a shower and after which patient felt that his legs were giving way and he had to sit down. After which he felt very weak to stand and had to be helped. He found it difficult to walk. He was brought to the ER. In the ER he was found to be nonfocal. But given the increased generalized weakness and need for further help patient may need placement. Patient denies any chest pain shortness of breath abdominal pain diarrhea. He did have mild nausea. Denies any fever or chills.  Review of Systems: As presented in the history of presenting illness, rest negative.  Past Medical History  Diagnosis Date  . Hypercholesteremia   . Anxiety   . GERD (gastroesophageal reflux disease)   . Glaucoma(365)   . Esophageal tear 1990's?  . Hypertension   . Constipation   . Cancer     hx of waldrens disease  . Benign neoplasm of colon 02/2012    polyps x 2 adenomatous w/o high grade dysplasia  . CAD (coronary artery disease) 2007    s/p CABG in [place]  . Diverticulosis of colon (without mention of hemorrhage)     hx LGIB, 04/2012 hosp  . History of resection of small bowel   . Osteoporosis, senile     hx T11 compression fx 02/2012, s/p KP   Past Surgical History  Procedure Laterality Date  . Cardiac surgery    . Appendectomy    . Hernia repair    . Colonoscopy  02/29/2012    Procedure: COLONOSCOPY;  Surgeon: Hart Carwin, MD;  Location: [facility]  ENDOSCOPY;  Service: Endoscopy;  Laterality: N/A;  . Esophagus surgery      due to esophageal tear  . Small intestine surgery      20 yrs ago; in [place]  . Coronary artery bypass graft      > 6 yrs  . Kyphosis surgery     Social History:  reports that he quit smoking about 50 years ago. He has never used smokeless tobacco. He reports that he does not drink alcohol or use illicit drugs. Lives at home. where does patient live-- Can do ADLs. Can patient participate in ADLs?  Allergies  Allergen Reactions  . Penicillins Swelling    Family History  Problem Relation Age of Onset  . Colon cancer Neg Hx   . Malignant hyperthermia Neg Hx       Prior to Admission medications   Medication Sig Start Date End Date Taking? Authorizing Provider  amLODipine-benazepril (LOTREL) 5-40 MG per capsule Take 1 capsule by mouth daily.   Yes Historical Provider, MD  aspirin EC 81 MG tablet Take 1 tablet (81 mg total) by mouth daily. 05/08/12  Yes Katy Apo, MD  bisoprolol-hydrochlorothiazide [facility]) 5-6.25 MG per tablet Take 1 tablet by mouth daily.   Yes Katy Apo, MD  fluticasone (FLONASE) 50 MCG/ACT nasal spray Place 1 spray into the nose daily as needed. allergies 02/04/12  Yes Historical Provider, MD  furosemide (  LASIX) 20 MG tablet Take 20 mg by mouth daily as needed (edema).   Yes Historical Provider, MD  Multiple Vitamin (MULITIVITAMIN WITH MINERALS) TABS Take 1 tablet by mouth daily.   Yes Historical Provider, MD  nitroGLYCERIN (NITROSTAT) 0.4 MG SL tablet Place 0.4 mg under the tongue every 5 (five) minutes as needed. Chest pain   Yes Historical Provider, MD  omeprazole (PRILOSEC) 20 MG capsule TAKE 1 CAPSULE (20 MG TOTAL) BY MOUTH DAILY. 05/19/12  Yes Meredith Pel, NP  promethazine (PHENERGAN) 25 MG tablet Take 25 mg by mouth every 6 (six) hours as needed. For nausea   Yes Historical Provider, MD  simvastatin (ZOCOR) 10 MG tablet Take 1 tablet (10 mg total) by mouth at bedtime.  03/02/12  Yes Meredith Pel, NP   Physical Exam: Filed Vitals:   03/05/13 0205 03/05/13 0206 03/05/13 0207 03/05/13 0210  BP: 132/67 132/67 122/68 132/69  Pulse: 82 88 83 90  Temp: 98.6 F (37 C)     TempSrc: Oral     Resp: 20     Height:      Weight:      SpO2: 94%        General:  Well-developed well-nourished.  Eyes:  Anicteric no pallor.  ENT: No discharge from the ears eyes nose or mouth.  Neck: No mass felt.  Cardiovascular: S1-S2 heard.  Respiratory: No rhonchi or crepitations.  Abdomen: Soft nontender bowel sounds present.  Skin: No rash.  Musculoskeletal: No edema.  Psychiatric: Appears normal.  Neurologic: Alert awake oriented to time place and person. Moves all extremities 5 x 5.  Labs on Admission:  Basic Metabolic Panel:  Recent Labs Lab 03/04/13 2112 03/04/13 2132 03/04/13 2308  NA 135 139 139  K 3.3* 3.3* 3.2*  CL 101 105 105  CO2 23  --   --   GLUCOSE 130* 137* 125*  BUN 16 16 16   CREATININE 0.72 0.70 0.70  CALCIUM 9.1  --   --    Liver Function Tests:  Recent Labs Lab 03/04/13 2112  AST 29  ALT 16  ALKPHOS 71  BILITOT 0.5  PROT 8.4*  ALBUMIN 3.1*   No results found for this basename: LIPASE, AMYLASE,  in the last 168 hours No results found for this basename: AMMONIA,  in the last 168 hours CBC:  Recent Labs Lab 03/04/13 2112 03/04/13 2132 03/04/13 2308  WBC 9.0  --   --   NEUTROABS 7.1  --   --   HGB 11.8* 11.9* 11.9*  HCT 32.1* 35.0* 35.0*  MCV 91.5  --   --   PLT 165  --   --    Cardiac Enzymes:  Recent Labs Lab 03/04/13 2112  TROPONINI <0.30    BNP (last 3 results) No results found for this basename: PROBNP,  in the last 8760 hours CBG: No results found for this basename: GLUCAP,  in the last 168 hours  Radiological Exams on Admission: Dg Chest Port 1 View  03/04/2013  *RADIOLOGY REPORT*  Clinical Data: 79 year old male chest pain.  Weakness and weight loss.  PORTABLE CHEST - 1 VIEW  Comparison:  05/06/2012 and earlier.  Findings: Portable AP semi upright view 2139 hours.  Stable lung volumes.  Stable cardiac size and mediastinal contours.  Sequelae of CABG.  Pulmonary vascularity is not significantly changed and there is no overt pulmonary edema.  No pneumothorax.  Possible chronic small left pleural effusion or pleural scarring.  No consolidation.  Sequelae of lower thoracic kyphoplasty or vertebroplasty.  IMPRESSION: No acute cardiopulmonary abnormality.  Chronic small left pleural effusion or pleural scarring.   Original Report Authenticated By: Erskine Speed, M.D.     EKG: Independently reviewed. Normal sinus rhythm.  Assessment/Plan Principal Problem:   Weakness Active Problems:   Hypertension   Hyperlipidemia   CAD (coronary artery disease)   Anemia   1. Generalized weakness - at this time patient does not have any focal signs. Consult physical therapy. Check orthostatics. For now I am holding the patient's Lasix and HCTZ. Patient may need placement. 2. CAD status post CABG - denies any chest pain or shortness of breath. 3. Hypertension - presently holding off Lasix and HCTZ but continue other medications. If patient is not orthostatic continue Lasix and HCTZ. 4. Hyperlipidemia - continue present medications. 5. Anemia - closely follow CBC.   Code Status: DO NOT RESUSCITATE.  Family Communication: None.  Disposition Plan: Admit to inpatient. Patient may need placement.    Journe Hallmark N. [facility] Pager [phone].  If 7PM-7AM, please contact night-coverage [email or web address] Password [facility] 03/05/2013, 3:04 AM

## 2013-03-05 NOTE — Care Management Note (Addendum)
    Page 1 of 2   03/05/2013     12:11:43 PM   CARE MANAGEMENT NOTE 03/05/2013  Patient:  Andre Mueller, Andre Mueller   Account Number:  [id number]  Date Initiated:  03/05/2013  Documentation initiated by:  Letha Cape  Subjective/Objective Assessment:   dx weakness  admit as observaiton - lives with wife at [facility].     Action/Plan:   pt eval   Anticipated DC Date:  03/05/2013   Anticipated DC Plan:  HOME W HOME HEALTH SERVICES      DC Planning Services  CM consult      [facility] Choice  HOME HEALTH   Choice offered to / List presented to:  C-4 Adult Children        HH arranged  HH-1 RN  HH-2 PT  HH-3 OT  HH-4 NURSE'S AIDE      HH agency  [facility]   Status of service:  Completed, signed off Medicare Important Message given?   (If response is "NO", the following Medicare IM given date fields will be blank) Date Medicare IM given:   Date Additional Medicare IM given:    Discharge Disposition:  HOME W HOME HEALTH SERVICES  Per UR Regulation:  Reviewed for med. necessity/level of care/duration of stay  If discussed at Long Length of Stay Meetings, dates discussed:    Comments:  03/05/13 11:56 Letha Cape RN, BSN [phone] patient lives with wife at [facility] , they chose [facility] for hhrn, pt, ot, and aide.  Lisa with [facility] notified.  Patient for dc today, Soc will begin 24-48 hrs post discharge, patient will have a private person there today for 24 hr care, per Misty Stanley with [facility].

## 2013-03-05 NOTE — Progress Notes (Signed)
NURSING PROGRESS NOTE  Andre Mueller [id number] Discharge Data: 03/05/2013 12:20 PM Attending Provider: Vassie Loll, MD [id number] Andre Coyer, MD     Andre Mueller to be D/C'd Home per MD order.  Discussed with the patient the After Visit Summary and all questions fully answered. All IV's discontinued with no bleeding noted. All belongings returned to patient for patient to take home.   Last Vital Signs:  Blood pressure 133/73, pulse 74, temperature 97.6 F (36.4 C), temperature source Oral, resp. rate 20, height 5\' 8"  (1.727 m), weight 71.668 kg (158 lb), SpO2 96.00%.  Discharge Medication List   Medication List    STOP taking these medications       furosemide 20 MG tablet  Commonly known as:  LASIX      TAKE these medications       amLODipine-benazepril 5-40 MG per capsule  Commonly known as:  LOTREL  Take 1 capsule by mouth daily.     aspirin EC 81 MG tablet  Take 1 tablet (81 mg total) by mouth daily.     bisoprolol-hydrochlorothiazide 5-6.25 MG per tablet  Commonly known as:  ZIAC  Take 1 tablet by mouth daily.     fluticasone 50 MCG/ACT nasal spray  Commonly known as:  FLONASE  Place 1 spray into the nose daily as needed. allergies     multivitamin with minerals Tabs  Take 1 tablet by mouth daily.     nitroGLYCERIN 0.4 MG SL tablet  Commonly known as:  NITROSTAT  Place 0.4 mg under the tongue every 5 (five) minutes as needed. Chest pain     omeprazole 20 MG capsule  Commonly known as:  PRILOSEC  TAKE 1 CAPSULE (20 MG TOTAL) BY MOUTH DAILY.     promethazine 25 MG tablet  Commonly known as:  PHENERGAN  Take 25 mg by mouth every 6 (six) hours as needed. For nausea     simvastatin 10 MG tablet  Commonly known as:  ZOCOR  Take 1 tablet (10 mg total) by mouth at bedtime.

## 2013-05-12 ENCOUNTER — Emergency Department (HOSPITAL_COMMUNITY)
Admission: EM | Admit: 2013-05-12 | Discharge: 2013-05-12 | Disposition: A | Discharge status: Home / Self Care | Payer: Medicare Other | Attending: Emergency Medicine | Admitting: Emergency Medicine

## 2013-05-12 ENCOUNTER — Encounter (HOSPITAL_COMMUNITY): Payer: Self-pay | Admitting: Emergency Medicine

## 2013-05-12 ENCOUNTER — Emergency Department (HOSPITAL_COMMUNITY): Payer: Medicare Other

## 2013-05-12 DIAGNOSIS — M810 Age-related osteoporosis without current pathological fracture: Secondary | ICD-10-CM | POA: Insufficient documentation

## 2013-05-12 DIAGNOSIS — IMO0002 Reserved for concepts with insufficient information to code with codable children: Secondary | ICD-10-CM | POA: Insufficient documentation

## 2013-05-12 DIAGNOSIS — I10 Essential (primary) hypertension: Secondary | ICD-10-CM | POA: Insufficient documentation

## 2013-05-12 DIAGNOSIS — Z79899 Other long term (current) drug therapy: Secondary | ICD-10-CM | POA: Insufficient documentation

## 2013-05-12 DIAGNOSIS — Z859 Personal history of malignant neoplasm, unspecified: Secondary | ICD-10-CM | POA: Insufficient documentation

## 2013-05-12 DIAGNOSIS — E7800 Pure hypercholesterolemia, unspecified: Secondary | ICD-10-CM | POA: Insufficient documentation

## 2013-05-12 DIAGNOSIS — Z87898 Personal history of other specified conditions: Secondary | ICD-10-CM | POA: Insufficient documentation

## 2013-05-12 DIAGNOSIS — Z87891 Personal history of nicotine dependence: Secondary | ICD-10-CM | POA: Insufficient documentation

## 2013-05-12 DIAGNOSIS — K5730 Diverticulosis of large intestine without perforation or abscess without bleeding: Secondary | ICD-10-CM | POA: Insufficient documentation

## 2013-05-12 DIAGNOSIS — I2510 Atherosclerotic heart disease of native coronary artery without angina pectoris: Secondary | ICD-10-CM | POA: Insufficient documentation

## 2013-05-12 DIAGNOSIS — M549 Dorsalgia, unspecified: Secondary | ICD-10-CM

## 2013-05-12 DIAGNOSIS — Y92009 Unspecified place in unspecified non-institutional (private) residence as the place of occurrence of the external cause: Secondary | ICD-10-CM | POA: Insufficient documentation

## 2013-05-12 DIAGNOSIS — Z8659 Personal history of other mental and behavioral disorders: Secondary | ICD-10-CM | POA: Insufficient documentation

## 2013-05-12 DIAGNOSIS — Z8719 Personal history of other diseases of the digestive system: Secondary | ICD-10-CM | POA: Insufficient documentation

## 2013-05-12 DIAGNOSIS — S0003XA Contusion of scalp, initial encounter: Secondary | ICD-10-CM | POA: Insufficient documentation

## 2013-05-12 DIAGNOSIS — W010XXA Fall on same level from slipping, tripping and stumbling without subsequent striking against object, initial encounter: Secondary | ICD-10-CM | POA: Insufficient documentation

## 2013-05-12 DIAGNOSIS — Z7982 Long term (current) use of aspirin: Secondary | ICD-10-CM | POA: Insufficient documentation

## 2013-05-12 DIAGNOSIS — Y9301 Activity, walking, marching and hiking: Secondary | ICD-10-CM | POA: Insufficient documentation

## 2013-05-12 DIAGNOSIS — K219 Gastro-esophageal reflux disease without esophagitis: Secondary | ICD-10-CM | POA: Insufficient documentation

## 2013-05-12 MED ORDER — ONDANSETRON 4 MG PO TBDP
4.0000 mg | ORAL_TABLET | Freq: Once | ORAL | Status: AC
  Administered 2013-05-12: 4 mg via ORAL
  Filled 2013-05-12: qty 1

## 2013-05-12 MED ORDER — TRAMADOL HCL 50 MG PO TABS: 50.0000 mg | ORAL_TABLET | Freq: Three times a day (TID) | ORAL | Status: DC | PRN

## 2013-05-12 NOTE — ED Notes (Signed)
Pt alert and mentating appropriately upon d/c. Pt given d/c teaching, prescription, and follow up care instructions. PT verbalizes understanding of d/c teaching and has no further questions upon d/c. Pt taken to son's car and helped into vehicle. NAD noted upon d/c. NAD noted upon d/c. Pt leaving with son.

## 2013-05-12 NOTE — ED Notes (Signed)
Per EMS: pt from assisted living; pt tripped and fell on carpet x 2 today; upon arrival pt c/o chest pain and upper back pain; pt alert and oriented per normal. Pt hit head in fall with small hematoma noted to top of head. Pt denies dizziness and lightheadedness. EMS walked to bathroom and states pt has noted difficulty with walking. BP 146/68 HR 78 RR 16. 12 lead unremarkable.

## 2013-05-12 NOTE — ED Provider Notes (Signed)
Medical screening examination/treatment/procedure(s) were performed by non-physician practitioner and as supervising physician I was immediately available for consultation/collaboration.  Juliet Rude. Rubin Payor, MD 05/12/13 2158

## 2013-05-12 NOTE — ED Notes (Signed)
Pt's son called and notified that pt is ready for d/c and could be picked up at this time. Notified charge RN that pt is waiting for ride and would need to stay in room.

## 2013-05-12 NOTE — ED Notes (Signed)
[name] son, Antoinette, Haskett [phone]  [name] wife, Ravi Tuccillo [phone]

## 2013-05-12 NOTE — ED Notes (Signed)
Pt returned from radiology.

## 2013-05-12 NOTE — ED Provider Notes (Signed)
History    CSN: [id number] Arrival date & time 05/12/13  1419  First MD Initiated Contact with Patient 05/12/13 1500     Chief Complaint  Patient presents with  . Fall  . Chest Pain   (Consider location/radiation/quality/duration/timing/severity/associated sxs/prior Treatment) HPI Comments: Earnestine Tuohey 79 y.o. Had a fall. "i tripped over the throw". Denies lightheadedness, dizziness, chest pain, palpitations, SOB, or weakness prior. Now complains of back pain, chest pain, and headache, which all started after the fall.  Patient is a 79 y.o. male presenting with fall. The history is provided by the patient.  Fall This is a new problem. The current episode started today. The problem occurs rarely (twice today). The problem has been resolved. Associated symptoms include chest pain (mid chest, started after fall, worse with deep breathing and movement, does not feel like previous MI) and headaches (generalized like HA in past, 3/10, non radiating, no known exacerbating or relieving factors, no visual change, no vomiting). Pertinent negatives include no abdominal pain, anorexia, arthralgias, change in bowel habit, chills, congestion, coughing, diaphoresis, fatigue, fever, myalgias, nausea, numbness, rash, sore throat, swollen glands, urinary symptoms, vertigo, visual change, vomiting or weakness. Nothing aggravates the symptoms. He has tried nothing for the symptoms.   Past Medical History  Diagnosis Date  . Hypercholesteremia   . Anxiety   . GERD (gastroesophageal reflux disease)   . Glaucoma   . Esophageal tear 1990's?  . Hypertension   . Constipation   . Cancer     hx of waldrens disease  . Benign neoplasm of colon 02/2012    polyps x 2 adenomatous w/o high grade dysplasia  . CAD (coronary artery disease) 2007    s/p CABG in [place]  . Diverticulosis of colon (without mention of hemorrhage)     hx LGIB, 04/2012 hosp  . History of resection of small bowel   . Osteoporosis, senile      hx T11 compression fx 02/2012, s/p KP   Past Surgical History  Procedure Laterality Date  . Cardiac surgery    . Appendectomy    . Hernia repair    . Colonoscopy  02/29/2012    Procedure: COLONOSCOPY;  Surgeon: Hart Carwin, MD;  Location: [facility];  Service: Endoscopy;  Laterality: N/A;  . Esophagus surgery      due to esophageal tear  . Small intestine surgery      20 yrs ago; in [place]  . Coronary artery bypass graft      > 6 yrs  . Kyphosis surgery     Family History  Problem Relation Age of Onset  . Colon cancer Neg Hx   . Malignant hyperthermia Neg Hx    History  Substance Use Topics  . Smoking status: Former Smoker    Quit date: 09/19/1962  . 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 Alcohol Use: No    Review of Systems  Constitutional: Negative for fever, chills, diaphoresis, activity change, appetite change and fatigue.  HENT: Negative for congestion, sore throat, rhinorrhea, sneezing and trouble swallowing.   Eyes: Negative for pain and redness.  Respiratory: Positive for chest tightness. Negative for cough, choking, shortness of breath, wheezing and stridor.   Cardiovascular: Positive for chest pain (mid chest, started  after fall, worse with deep breathing and movement, does not feel like previous MI). Negative for leg swelling.  Gastrointestinal: Negative for nausea, vomiting, abdominal pain, diarrhea, constipation, blood in stool, abdominal distention, anal bleeding, anorexia and change in bowel habit.  Musculoskeletal: Positive for back pain. Negative for myalgias and arthralgias.  Skin: Negative for rash.  Neurological: Positive for headaches (generalized like HA in past, 3/10, non radiating, no known exacerbating or relieving factors, no visual  change, no vomiting). Negative for dizziness, vertigo, speech difficulty, weakness, light-headedness and numbness.  Hematological: Negative for adenopathy.  Psychiatric/Behavioral: Negative for confusion.    Allergies  Penicillins  Home Medications   Current Outpatient Rx  Name  Route  Sig  Dispense  Refill  . amLODipine-benazepril (LOTREL) 5-40 MG per capsule   Oral   Take 1 capsule by mouth daily.         Marland Kitchen aspirin EC 81 MG tablet   Oral   Take 1 tablet (81 mg total) by mouth daily.           Stop aspirin x 1 week, resume 05/16/12   . bisoprolol-hydrochlorothiazide (ZIAC) 5-6.25 MG per tablet   Oral   Take 1 tablet by mouth daily.         Tery Sanfilippo Calcium (STOOL SOFTENER PO)   Oral   Take 3 capsules by mouth every evening.         . fluticasone (FLONASE) 50 MCG/ACT nasal spray   Nasal   Place 1 spray into the nose daily as needed. allergies         . Multiple Vitamin (MULITIVITAMIN WITH MINERALS) TABS   Oral   Take 1 tablet by mouth daily.         Marland Kitchen OVER THE COUNTER MEDICATION   Oral   Take 1 tablet by mouth at bedtime.         Marland Kitchen OVER THE COUNTER MEDICATION   Oral   Take 1 tablet by mouth daily as needed (Cough).         . OVER THE COUNTER MEDICATION   Topical   Apply 1 application topically at bedtime. Apply ointment to back each evening for pain.         . promethazine (PHENERGAN) 25 MG tablet   Oral   Take 25 mg by mouth every 6 (six) hours as needed. For nausea         . simvastatin (ZOCOR) 10 MG tablet   Oral   Take 10 mg by mouth daily.         . nitroGLYCERIN (NITROSTAT) 0.4 MG SL tablet   Sublingual   Place 0.4 mg under the tongue every 5 (five) minutes as needed. Chest pain         . traMADol (ULTRAM) 50 MG tablet   Oral   Take 1 tablet (50 mg total) by mouth every 8 (eight) hours as needed for pain.   15 tablet   0    BP 127/59  Pulse 88  Temp(Src) 98.7 F (37.1 C) (Oral)  Resp 20  SpO2 95% Physical Exam   Nursing note and vitals reviewed. Constitutional: He is oriented to person, place, and time. He appears well-developed and well-nourished. No distress.  HENT:  Head: Normocephalic. Head is with contusion (small contusion at top of scalp). Head is without raccoon's eyes, without Battle's sign, without laceration, without right periorbital erythema and without left periorbital erythema.  Eyes: Conjunctivae and EOM are normal. Right eye exhibits no  discharge. Left eye exhibits no discharge.  Neck: Normal range of motion. Neck supple. No tracheal deviation present.  Cardiovascular: Normal rate, regular rhythm and normal heart sounds.  Exam reveals no friction rub.   No murmur heard. Pulmonary/Chest: Effort normal and breath sounds normal. No stridor. No respiratory distress. He has no wheezes. He has no rales. He exhibits no tenderness.  Abdominal: Soft. He exhibits no distension. There is no tenderness. There is no rebound and no guarding.  Musculoskeletal:       Right shoulder: Normal.       Left shoulder: Normal.       Cervical back: He exhibits normal range of motion, no tenderness, no bony tenderness and no swelling.       Thoracic back: He exhibits bony tenderness (midline, midthoracic). He exhibits normal range of motion, no swelling, no edema and no deformity.       Lumbar back: He exhibits normal range of motion, no tenderness, no bony tenderness and no swelling.  Neurological: He is alert and oriented to person, place, and time. No cranial nerve deficit (CN 2-12 grossly intact bilaterally). GCS eye subscore is 4. GCS verbal subscore is 5. GCS motor subscore is 6.  Moving all four extremities voluntarily and freely   Skin: Skin is warm.  Psychiatric: He has a normal mood and affect.    ED Course  Procedures (including critical care time) Labs Reviewed - No data to display Dg Chest 2 View  05/12/2013   *RADIOLOGY REPORT*  Clinical Data: 79 year old male status post fall with pain in  the sternum.  Chest pain.  CHEST - 2 VIEW, STERNUM - 2+ VIEW  Comparison:  03/04/2013 and earlier. Thoracolumbar radiographs from [facility] 11/14/2012.  Findings: Chest:  Stable lung volumes.  Stable cardiac size and mediastinal contours.  Sequelae of CABG.  Calcified pleural plaque or intravasation of bone cement near the right lung is stable.  No pneumothorax or pulmonary edema.  No definite effusion. Osteopenia.  Chronic lower thoracic (favor T12) compression fractures status post augmentation.  Progressed adjacent lower thoracic compression fracture since 11/14/2012. No displaced rib fracture identified.  Sternum:  Sequelae of median sternotomy.  No acute or displaced sternal fracture is evident.  The anterior clear space is relatively preserved.  IMPRESSION: 1.  There is a chronic lower thoracic compression fracture status post augmentation.  Compression of the adjacent (probably T11) vertebra has progressed since 11/14/2012.  If specific therapy such as  vertebroplasty of this level is desired, nuclear medicine whole body bone scan or thoracic MRI would best determine acuity. 2.  Otherwise no acute cardiopulmonary abnormality or acute traumatic injury identified. 3.  Previous median sternotomy.  No acute sternal fracture identified.   Original Report Authenticated By: Erskine Speed, M.D.   Dg Sternum  05/12/2013   *RADIOLOGY REPORT*  Clinical Data: 79 year old male status post fall with pain in the sternum.  Chest pain.  CHEST - 2 VIEW, STERNUM - 2+ VIEW  Comparison:  03/04/2013 and earlier. Thoracolumbar radiographs from [facility] 11/14/2012.  Findings: Chest:  Stable lung volumes.  Stable cardiac size and mediastinal contours.  Sequelae of CABG.  Calcified pleural plaque or intravasation of bone cement near the right lung is stable.  No pneumothorax or pulmonary edema.  No definite effusion. Osteopenia.  Chronic lower thoracic (favor T12) compression fractures status post augmentation.   Progressed adjacent lower thoracic compression fracture since 11/14/2012. No displaced rib fracture identified.  Sternum:  Sequelae of median sternotomy.  No acute or displaced sternal fracture is evident.  The anterior clear space is relatively preserved.  IMPRESSION: 1.  There is a chronic lower thoracic compression fracture status post augmentation.  Compression of the adjacent (probably T11) vertebra has progressed since 11/14/2012.  If specific therapy such as  vertebroplasty of this level is desired, nuclear medicine whole body bone scan or thoracic MRI would best determine acuity. 2.  Otherwise no acute cardiopulmonary abnormality or acute traumatic injury identified. 3.  Previous median sternotomy.  No acute sternal fracture identified.   Original Report Authenticated By: Erskine Speed, M.D.   Dg Thoracic Spine 2 View  05/12/2013   *RADIOLOGY REPORT*  Clinical Data: Fall.  Back pain.  THORACIC SPINE - 2 VIEW  Comparison: 11/14/2012  Findings: Moderate dextroconvex thoracic scoliosis.  The motion artifact blurs the thoracic spine on the lateral projection. Along with the scoliosis causing obliquity of vertebral margins on lateral projection, this reduces diagnostic sensitivity.  Chronic compression fractures at T10 and T11 noted with vertebral augmentation at T11, similar to prior exams.  Thoracic kyphosis is present.  Bony demineralization noted.  Atherosclerotic aortic arch with evidence prior CABG. Suspected anterior wedging at T6, probably increased compared to 11/14/2012.  IMPRESSION:  1.  Anterior wedge compression fracture at T6, increased from 11/14/2012. 2.  No change in compression fractures at T10 and T11. 3.  Thoracic kyphosis and scoliosis with bony demineralization. 4.  Atherosclerosis.   Original Report Authenticated By: Gaylyn Rong, M.D.   Dg Lumbar Spine Complete  05/12/2013   *RADIOLOGY REPORT*  Clinical Data: Fall.  Midsternal pain.  Back pain.  LUMBAR SPINE - COMPLETE 4+ VIEW   Comparison: 04/04/2012  Findings: Levoconvex lumbar scoliosis noted.  Multilevel facet arthropathy observed lumbar spondylosis is present.  No new lumbar fracture or acute subluxation identified.  Aortoiliac atherosclerotic calcification is present.  IMPRESSION:  1.  No lumbar spine fracture or acute subluxation is observed. 2.  Bony demineralization with lumbar spondylosis and scoliosis. 3.  Atherosclerosis.   Original Report Authenticated By: Gaylyn Rong, M.D.   Ct Head Wo Contrast  05/12/2013   *RADIOLOGY REPORT*  Clinical Data:  Tripped and fell, small hematoma at top of head, alert and oriented, history hypertension  CT HEAD WITHOUT CONTRAST CT CERVICAL SPINE WITHOUT CONTRAST  Technique:  Multidetector CT imaging of the head and cervical spine was performed following the standard protocol without intravenous contrast.  Multiplanar CT image reconstructions of the cervical spine were also generated.  Comparison:  None  CT HEAD  Findings: Motion artifacts for which repeat imaging was performed. Generalized atrophy. Normal ventricular morphology. No midline shift or mass effect. Small vessel chronic ischemic changes of deep cerebral white matter. No intracranial hemorrhage, mass lesion, or acute infarction. Visualized paranasal sinuses and mastoid air cells clear. Bones unremarkable. Atherosclerotic calcifications at skull base.  IMPRESSION: Atrophy with small vessel chronic ischemic changes of deep cerebral white matter. No acute intracranial abnormalities.  CT CERVICAL SPINE  Findings: Motion artifacts, for which repeat imaging was performed. Prevertebral soft tissues normal thickness. Diffuse osseous demineralization. Disc space narrowing with endplate spur formation at C5-C6 and C6- C7. Vertebral body heights maintained. Mild scattered facet degenerative changes. No acute fracture, subluxation, or bone destruction. Scattered atherosclerotic calcifications. Uncovertebral degenerative changes notably  right C5-C6 and C6-C7. Visualized skull base intact.  IMPRESSION: Osseous demineralization. Scattered degenerative disc and facet disease changes. No acute cervical spine abnormalities.   Original Report Authenticated By: Ulyses Southward, M.D.   Ct  Cervical Spine Wo Contrast  05/12/2013   *RADIOLOGY REPORT*  Clinical Data:  Tripped and fell, small hematoma at top of head, alert and oriented, history hypertension  CT HEAD WITHOUT CONTRAST CT CERVICAL SPINE WITHOUT CONTRAST  Technique:  Multidetector CT imaging of the head and cervical spine was performed following the standard protocol without intravenous contrast.  Multiplanar CT image reconstructions of the cervical spine were also generated.  Comparison:  None  CT HEAD  Findings: Motion artifacts for which repeat imaging was performed. Generalized atrophy. Normal ventricular morphology. No midline shift or mass effect. Small vessel chronic ischemic changes of deep cerebral white matter. No intracranial hemorrhage, mass lesion, or acute infarction. Visualized paranasal sinuses and mastoid air cells clear. Bones unremarkable. Atherosclerotic calcifications at skull base.  IMPRESSION: Atrophy with small vessel chronic ischemic changes of deep cerebral white matter. No acute intracranial abnormalities.  CT CERVICAL SPINE  Findings: Motion artifacts, for which repeat imaging was performed. Prevertebral soft tissues normal thickness. Diffuse osseous demineralization. Disc space narrowing with endplate spur formation at C5-C6 and C6- C7. Vertebral body heights maintained. Mild scattered facet degenerative changes. No acute fracture, subluxation, or bone destruction. Scattered atherosclerotic calcifications. Uncovertebral degenerative changes notably right C5-C6 and C6-C7. Visualized skull base intact.  IMPRESSION: Osseous demineralization. Scattered degenerative disc and facet disease changes. No acute cervical spine abnormalities.   Original Report Authenticated By: Ulyses Southward, M.D.   1. Back pain      Date: 05/12/2013  Rate: 92  Rhythm: normal sinus rhythm  QRS Axis: normal  Intervals: PR prolonged  ST/T Wave abnormalities: nonspecific ST changes and nonspecific T wave changes  Conduction Disutrbances:first-degree A-V block   Narrative Interpretation: NSR with 1st degree AV block  Old EKG Reviewed: changes noted, similar with now 1st degree AV block   MDM  [name] 79 y.o. presents after a mechanical fall. No prodromal symptoms. No LOC or amnesia. C/O back pain, chest pain and headache. Chest pain and back pain positional and reproducible. No focal deficits on exam. AOx3. Focal areas of pain reproducible on exam. Plan for CT head/c spine, CXR, sternum XR, and thoracolumbar XR.  CT head negative for acute intracranial abnormality. CT C-spine negative for fracture or malalignment. No sternal fracture noted. There is loss of height at T11 and T6, likely suggestive of fracture. Considering there is no neurologic deficit on exam, the patient to be managed on outpatient basis. He was instructed to followup with his neurosurgeon for further evaluation and management. Patient was agreeable to this plan and discharged on event. Given strong return precautions.  I discussed this patient's care with my attending, Dr. Blinda Leatherwood. Imaging reviewed      Sena Hitch, MD 05/12/13 1758  Sena Hitch, MD 05/12/13 [id number]

## 2013-05-14 ENCOUNTER — Encounter (HOSPITAL_COMMUNITY): Payer: Self-pay | Admitting: Emergency Medicine

## 2013-05-14 ENCOUNTER — Emergency Department (HOSPITAL_COMMUNITY): Payer: Medicare Other

## 2013-05-14 ENCOUNTER — Emergency Department (HOSPITAL_COMMUNITY)
Admission: EM | Admit: 2013-05-14 | Discharge: 2013-05-14 | Disposition: A | Discharge status: Other Acute Inpatient Hospital | Payer: Medicare Other | Attending: Emergency Medicine | Admitting: Emergency Medicine

## 2013-05-14 DIAGNOSIS — Z880 Allergy status to penicillin: Secondary | ICD-10-CM | POA: Insufficient documentation

## 2013-05-14 DIAGNOSIS — C801 Malignant (primary) neoplasm, unspecified: Secondary | ICD-10-CM | POA: Insufficient documentation

## 2013-05-14 DIAGNOSIS — R5381 Other malaise: Secondary | ICD-10-CM | POA: Insufficient documentation

## 2013-05-14 DIAGNOSIS — I2510 Atherosclerotic heart disease of native coronary artery without angina pectoris: Secondary | ICD-10-CM | POA: Insufficient documentation

## 2013-05-14 DIAGNOSIS — R05 Cough: Secondary | ICD-10-CM | POA: Insufficient documentation

## 2013-05-14 DIAGNOSIS — R059 Cough, unspecified: Secondary | ICD-10-CM | POA: Insufficient documentation

## 2013-05-14 DIAGNOSIS — F411 Generalized anxiety disorder: Secondary | ICD-10-CM | POA: Insufficient documentation

## 2013-05-14 DIAGNOSIS — I10 Essential (primary) hypertension: Secondary | ICD-10-CM | POA: Insufficient documentation

## 2013-05-14 DIAGNOSIS — K219 Gastro-esophageal reflux disease without esophagitis: Secondary | ICD-10-CM | POA: Insufficient documentation

## 2013-05-14 DIAGNOSIS — M810 Age-related osteoporosis without current pathological fracture: Secondary | ICD-10-CM | POA: Insufficient documentation

## 2013-05-14 DIAGNOSIS — H409 Unspecified glaucoma: Secondary | ICD-10-CM | POA: Insufficient documentation

## 2013-05-14 DIAGNOSIS — R296 Repeated falls: Secondary | ICD-10-CM

## 2013-05-14 DIAGNOSIS — R531 Weakness: Secondary | ICD-10-CM

## 2013-05-14 DIAGNOSIS — E7800 Pure hypercholesterolemia, unspecified: Secondary | ICD-10-CM | POA: Insufficient documentation

## 2013-05-14 DIAGNOSIS — Z951 Presence of aortocoronary bypass graft: Secondary | ICD-10-CM | POA: Insufficient documentation

## 2013-05-14 DIAGNOSIS — Z87891 Personal history of nicotine dependence: Secondary | ICD-10-CM | POA: Insufficient documentation

## 2013-05-14 DIAGNOSIS — Z9181 History of falling: Secondary | ICD-10-CM | POA: Insufficient documentation

## 2013-05-14 DIAGNOSIS — R5383 Other fatigue: Secondary | ICD-10-CM | POA: Insufficient documentation

## 2013-05-14 DIAGNOSIS — Z79899 Other long term (current) drug therapy: Secondary | ICD-10-CM | POA: Insufficient documentation

## 2013-05-14 DIAGNOSIS — Z7982 Long term (current) use of aspirin: Secondary | ICD-10-CM | POA: Insufficient documentation

## 2013-05-14 LAB — COMPREHENSIVE METABOLIC PANEL
ALT: 13 U/L (ref 0–53)
AST: 40 U/L — ABNORMAL HIGH (ref 0–37)
Albumin: 3.1 g/dL — ABNORMAL LOW (ref 3.5–5.2)
Calcium: 8.9 mg/dL (ref 8.4–10.5)
GFR calc Af Amer: 88 mL/min — ABNORMAL LOW (ref >90)
Sodium: 129 mEq/L — ABNORMAL LOW (ref 135–145)
Total Protein: 8.6 g/dL — ABNORMAL HIGH (ref 6.0–8.3)

## 2013-05-14 LAB — URINALYSIS, ROUTINE W REFLEX MICROSCOPIC
Bilirubin Urine: NEGATIVE
Glucose, UA: NEGATIVE mg/dL
Hgb urine dipstick: NEGATIVE
Nitrite: NEGATIVE
Specific Gravity, Urine: 1.017 (ref 1.005–1.030)
pH: 5.5 (ref 5.0–8.0)

## 2013-05-14 LAB — CBC WITH DIFFERENTIAL/PLATELET
Basophils Absolute: 0 10*3/uL (ref 0.0–0.1)
Basophils Relative: 0 % (ref 0–1)
Eosinophils Absolute: 0.1 10*3/uL (ref 0.0–0.7)
Eosinophils Relative: 1 % (ref 0–5)
MCH: 34.5 pg — ABNORMAL HIGH (ref 26.0–34.0)
MCV: 94.4 fL (ref 78.0–100.0)
Platelets: 148 10*3/uL — ABNORMAL LOW (ref 150–400)
RDW: 14.2 % (ref 11.5–15.5)

## 2013-05-14 LAB — POCT I-STAT TROPONIN I

## 2013-05-14 LAB — OCCULT BLOOD, POC DEVICE: Fecal Occult Bld: NEGATIVE

## 2013-05-14 NOTE — ED Notes (Signed)
Pt refusing to move to POD C and wants to walk home.He is very upset of PTARs delay and getting agitated when trying to move to POD C.

## 2013-05-14 NOTE — ED Provider Notes (Cosign Needed)
History    CSN: [id number] Arrival date & time 05/14/13  0945  First MD Initiated Contact with Patient 05/14/13 [phone]     Chief Complaint  Patient presents with  . Cough   (Consider location/radiation/quality/duration/timing/severity/associated sxs/prior Treatment) Patient is a 79 y.o. male presenting with weakness. The history is provided by the patient and the spouse. No language interpreter was used.  Weakness Chronicity: Pt's wife says he is very weak, has frequent falls.  She has to get him around on a walker.  She feels he has some undiagnosed illness. The current episode started more than 1 week ago (He was seen here 2 days ago after a fall; his x-rays showed no fracture.). The problem occurs constantly. The problem has not changed since onset.Associated symptoms include shortness of breath. Pertinent negatives include no chest pain, no abdominal pain and no headaches. Associated symptoms comments: He complains of cough and of constipation.. Nothing aggravates the symptoms. Nothing relieves the symptoms. He has tried nothing for the symptoms.   Past Medical History  Diagnosis Date  . Hypercholesteremia   . Anxiety   . GERD (gastroesophageal reflux disease)   . Glaucoma   . Esophageal tear 1990's?  . Hypertension   . Constipation   . Cancer     hx of waldrens disease  . Benign neoplasm of colon 02/2012    polyps x 2 adenomatous w/o high grade dysplasia  . CAD (coronary artery disease) 2007    s/p CABG in [place]  . Diverticulosis of colon (without mention of hemorrhage)     hx LGIB, 04/2012 hosp  . History of resection of small bowel   . Osteoporosis, senile     hx T11 compression fx 02/2012, s/p KP   Past Surgical History  Procedure Laterality Date  . Cardiac surgery    . Appendectomy    . Hernia repair    . Colonoscopy  02/29/2012    Procedure: COLONOSCOPY;  Surgeon: Hart Carwin, MD;  Location: [facility];  Service: Endoscopy;  Laterality: N/A;  . Esophagus surgery       due to esophageal tear  . Small intestine surgery      20 yrs ago; in [place]  . Coronary artery bypass graft      > 6 yrs  . Kyphosis surgery     Family History  Problem Relation Age of Onset  . Colon cancer Neg Hx   . Malignant hyperthermia Neg Hx    History  Substance Use Topics  . Smoking status: Former Smoker    Quit date: 09/19/1962  . 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 Alcohol Use: No    Review of Systems  Constitutional: Negative for fever and chills.       Frequent falls.   HENT: Negative.   Eyes: Negative.   Respiratory: Positive for cough and shortness of breath.   Cardiovascular: Negative for chest pain.  Gastrointestinal: Positive for constipation. Negative for nausea, vomiting and abdominal pain.  Genitourinary: Negative.   Musculoskeletal: Negative.   Skin: Negative.   Neurological: Positive for weakness. Negative for headaches.  Psychiatric/Behavioral: Negative.     Allergies  Penicillins  Home Medications   Current Outpatient Rx  Name  Route  Sig  Dispense  Refill  . amLODipine-benazepril (LOTREL) 5-40 MG per capsule   Oral   Take 1 capsule by mouth daily.         Marland Kitchen aspirin EC 81 MG tablet   Oral   Take 1 tablet (81 mg total) by mouth daily.           Stop aspirin x 1 week, resume 05/16/12   . bisoprolol-hydrochlorothiazide (ZIAC) 5-6.25 MG per tablet   Oral   Take 1 tablet by mouth daily.         Tery Sanfilippo Calcium (STOOL SOFTENER PO)   Oral   Take 3 capsules by mouth every evening.         . fluticasone (FLONASE) 50 MCG/ACT nasal spray   Nasal   Place 1 spray into the nose daily as needed. allergies         . Multiple Vitamin (MULITIVITAMIN WITH MINERALS) TABS   Oral   Take 1 tablet by mouth daily.          . nitroGLYCERIN (NITROSTAT) 0.4 MG SL tablet   Sublingual   Place 0.4 mg under the tongue every 5 (five) minutes as needed. Chest pain         . OVER THE COUNTER MEDICATION   Oral   Take 1 tablet by mouth at bedtime.         Marland Kitchen OVER THE COUNTER MEDICATION   Oral   Take 1 tablet by mouth daily as needed (Cough).         . OVER THE COUNTER MEDICATION   Topical   Apply 1 application topically at bedtime. Apply ointment to back each evening for pain.         . promethazine (PHENERGAN) 25 MG tablet   Oral   Take 25 mg by mouth every 6 (six) hours as needed. For nausea         . simvastatin (ZOCOR) 10 MG tablet   Oral   Take 10 mg by mouth daily.         . traMADol (ULTRAM) 50 MG tablet   Oral   Take 1 tablet (50 mg total) by mouth every 8 (eight) hours as needed for pain.   15 tablet   0    BP 111/55  Pulse 84  Temp(Src) 97.6 F (36.4 C) (Oral)  Resp 15  SpO2 94% Physical Exam  Nursing note and vitals reviewed. Constitutional: He is oriented to person, place, and time. He appears well-developed and well-nourished. No distress.  HENT:  Head: Normocephalic and atraumatic.  Right Ear: External ear normal.  Left Ear: External ear normal.  Mouth/Throat: Oropharynx is clear and moist.  Eyes: Conjunctivae and EOM are normal. Pupils are equal, round, and reactive to light.  Neck: Normal range of motion. Neck supple.  Cardiovascular: Normal rate, regular rhythm and normal heart sounds.   Pulmonary/Chest: Effort normal and breath sounds normal.  Abdominal: Soft. Bowel sounds are normal.  Genitourinary:  Rectal shows no mass or tenderness.  No fecal impaction.  Stool hemoccult negative.  Musculoskeletal: Normal range of motion.  Neurological: He is alert and oriented to person, place, and time.  No sensory or motor deficit.   Skin: Skin is warm and dry.  Psychiatric: He has a normal mood and affect. His behavior is normal.    ED Course  Procedures  (including critical care time) Labs Reviewed  CBC WITH DIFFERENTIAL  COMPREHENSIVE METABOLIC PANEL  URINALYSIS, ROUTINE W REFLEX MICROSCOPIC  OCCULT BLOOD, POC DEVICE  Dg Chest 2 View  05/12/2013   *RADIOLOGY REPORT*  Clinical Data: 79 year old male status post fall with pain in the sternum.  Chest pain.  CHEST - 2 VIEW, STERNUM - 2+ VIEW  Comparison:  03/04/2013 and earlier. Thoracolumbar radiographs from [facility] 11/14/2012.  Findings: Chest:  Stable lung volumes.  Stable cardiac size and mediastinal contours.  Sequelae of CABG.  Calcified pleural plaque or intravasation of bone cement near the right lung is stable.  No pneumothorax or pulmonary edema.  No definite effusion. Osteopenia.  Chronic lower thoracic (favor T12) compression fractures status post augmentation.  Progressed adjacent lower thoracic compression fracture since 11/14/2012. No displaced rib fracture identified.  Sternum:  Sequelae of median sternotomy.  No acute or displaced sternal fracture is evident.  The anterior clear space is relatively preserved.  IMPRESSION: 1.  There is a chronic lower thoracic compression fracture status post augmentation.  Compression of the adjacent (probably T11) vertebra has progressed since 11/14/2012.  If specific therapy such as  vertebroplasty of this level is desired, nuclear medicine whole body bone scan or thoracic MRI would best determine acuity. 2.  Otherwise no acute cardiopulmonary abnormality or acute traumatic injury identified. 3.  Previous median sternotomy.  No acute sternal fracture identified.   Original Report Authenticated By: Erskine Speed, M.D.   Dg Sternum  05/12/2013   *RADIOLOGY REPORT*  Clinical Data: 79 year old male status post fall with pain in the sternum.  Chest pain.  CHEST - 2 VIEW, STERNUM - 2+ VIEW  Comparison:  03/04/2013 and earlier. Thoracolumbar radiographs from [facility] 11/14/2012.  Findings: Chest:  Stable lung volumes.  Stable cardiac size and  mediastinal contours.  Sequelae of CABG.  Calcified pleural plaque or intravasation of bone cement near the right lung is stable.  No pneumothorax or pulmonary edema.  No definite effusion. Osteopenia.  Chronic lower thoracic (favor T12) compression fractures status post augmentation.  Progressed adjacent lower thoracic compression fracture since 11/14/2012. No displaced rib fracture identified.  Sternum:  Sequelae of median sternotomy.  No acute or displaced sternal fracture is evident.  The anterior clear space is relatively preserved.  IMPRESSION: 1.  There is a chronic lower thoracic compression fracture status post augmentation.  Compression of the adjacent (probably T11) vertebra has progressed since 11/14/2012.  If specific therapy such as  vertebroplasty of this level is desired, nuclear medicine whole body bone scan or thoracic MRI would best determine acuity. 2.  Otherwise no acute cardiopulmonary abnormality or acute traumatic injury identified. 3.  Previous median sternotomy.  No acute sternal fracture identified.   Original Report Authenticated By: Erskine Speed, M.D.   Dg Thoracic Spine 2 View  05/12/2013   *RADIOLOGY REPORT*  Clinical Data: Fall.  Back pain.  THORACIC SPINE - 2 VIEW  Comparison: 11/14/2012  Findings: Moderate dextroconvex thoracic scoliosis.  The motion artifact blurs the thoracic spine on the lateral projection. Along with the scoliosis causing obliquity of vertebral margins on lateral projection, this reduces diagnostic sensitivity.  Chronic compression fractures at T10 and T11 noted with vertebral augmentation at T11, similar to prior exams.  Thoracic kyphosis is present.  Bony demineralization noted.  Atherosclerotic aortic arch with evidence prior CABG. Suspected anterior wedging at T6, probably increased compared to 11/14/2012.  IMPRESSION:  1.  Anterior wedge compression fracture at T6, increased from 11/14/2012. 2.  No change in compression fractures at T10 and T11. 3.   Thoracic kyphosis and scoliosis with bony demineralization. 4.  Atherosclerosis.  Original Report Authenticated By: Gaylyn Rong, M.D.   Dg Lumbar Spine Complete  05/12/2013   *RADIOLOGY REPORT*  Clinical Data: Fall.  Midsternal pain.  Back pain.  LUMBAR SPINE - COMPLETE 4+ VIEW  Comparison: 04/04/2012  Findings: Levoconvex lumbar scoliosis noted.  Multilevel facet arthropathy observed lumbar spondylosis is present.  No new lumbar fracture or acute subluxation identified.  Aortoiliac atherosclerotic calcification is present.  IMPRESSION:  1.  No lumbar spine fracture or acute subluxation is observed. 2.  Bony demineralization with lumbar spondylosis and scoliosis. 3.  Atherosclerosis.   Original Report Authenticated By: Gaylyn Rong, M.D.   Ct Head Wo Contrast  05/12/2013   *RADIOLOGY REPORT*  Clinical Data:  Tripped and fell, small hematoma at top of head, alert and oriented, history hypertension  CT HEAD WITHOUT CONTRAST CT CERVICAL SPINE WITHOUT CONTRAST  Technique:  Multidetector CT imaging of the head and cervical spine was performed following the standard protocol without intravenous contrast.  Multiplanar CT image reconstructions of the cervical spine were also generated.  Comparison:  None  CT HEAD  Findings: Motion artifacts for which repeat imaging was performed. Generalized atrophy. Normal ventricular morphology. No midline shift or mass effect. Small vessel chronic ischemic changes of deep cerebral white matter. No intracranial hemorrhage, mass lesion, or acute infarction. Visualized paranasal sinuses and mastoid air cells clear. Bones unremarkable. Atherosclerotic calcifications at skull base.  IMPRESSION: Atrophy with small vessel chronic ischemic changes of deep cerebral white matter. No acute intracranial abnormalities.  CT CERVICAL SPINE  Findings: Motion artifacts, for which repeat imaging was performed. Prevertebral soft tissues normal thickness. Diffuse osseous demineralization.  Disc space narrowing with endplate spur formation at C5-C6 and C6- C7. Vertebral body heights maintained. Mild scattered facet degenerative changes. No acute fracture, subluxation, or bone destruction. Scattered atherosclerotic calcifications. Uncovertebral degenerative changes notably right C5-C6 and C6-C7. Visualized skull base intact.  IMPRESSION: Osseous demineralization. Scattered degenerative disc and facet disease changes. No acute cervical spine abnormalities.   Original Report Authenticated By: Ulyses Southward, M.D.   Ct Cervical Spine Wo Contrast  05/12/2013   *RADIOLOGY REPORT*  Clinical Data:  Tripped and fell, small hematoma at top of head, alert and oriented, history hypertension  CT HEAD WITHOUT CONTRAST CT CERVICAL SPINE WITHOUT CONTRAST  Technique:  Multidetector CT imaging of the head and cervical spine was performed following the standard protocol without intravenous contrast.  Multiplanar CT image reconstructions of the cervical spine were also generated.  Comparison:  None  CT HEAD  Findings: Motion artifacts for which repeat imaging was performed. Generalized atrophy. Normal ventricular morphology. No midline shift or mass effect. Small vessel chronic ischemic changes of deep cerebral white matter. No intracranial hemorrhage, mass lesion, or acute infarction. Visualized paranasal sinuses and mastoid air cells clear. Bones unremarkable. Atherosclerotic calcifications at skull base.  IMPRESSION: Atrophy with small vessel chronic ischemic changes of deep cerebral white matter. No acute intracranial abnormalities.  CT CERVICAL SPINE  Findings: Motion artifacts, for which repeat imaging was performed. Prevertebral soft tissues normal thickness. Diffuse osseous demineralization. Disc space narrowing with endplate spur formation at C5-C6 and C6- C7. Vertebral body heights maintained. Mild scattered facet degenerative changes. No acute fracture, subluxation, or bone destruction. Scattered atherosclerotic  calcifications. Uncovertebral degenerative changes notably right C5-C6 and C6-C7. Visualized skull base intact.  IMPRESSION: Osseous demineralization. Scattered degenerative disc and facet disease changes. No acute cervical spine abnormalities.   Original Report Authenticated By: Ulyses Southward, M.D.   Dg Abd Acute W/chest  05/14/2013   *  RADIOLOGY REPORT*  Clinical Data: Cough and chest pain  ACUTE ABDOMEN SERIES (ABDOMEN 2 VIEW & CHEST 1 VIEW)  Comparison: 05/12/2013  Findings: Chest:  Prior CABG.  Negative for heart failure.  Small pleural effusions bilaterally.  Mild bibasilar atelectasis. Calcified pleural plaque along the right hemidiaphragm is unchanged.  Sclerotic right humeral head suggesting AVN.  Abdomen:  Negative for bowel obstruction.  No dilated bowel loops and no free air.  There is lumbar levoscoliosis.  Prior cement vertebral augmentation T11.  IMPRESSION: Bibasilar atelectasis and small pleural effusions bilaterally.  Nonobstructive bowel gas pattern.   Original Report Authenticated By: Janeece Riggers, M.D.   Course in ED:  Pt had complained of a cough and of constipation.  His wife says he is constantly falling, talks crazy.  She thinks something is very wrong, wants him admitted.  He had been seen here 2 days ago after a fall.    2:16 PM Results for orders placed during the hospital encounter of 05/14/13  CBC WITH DIFFERENTIAL      Result Value Range   WBC 7.2  4.0 - 10.5 K/uL   RBC 3.42 (*) 4.22 - 5.81 MIL/uL   Hemoglobin 11.8 (*) 13.0 - 17.0 g/dL   HCT 98.1 (*) 19.1 - 47.8 %   MCV 94.4  78.0 - 100.0 fL   MCH 34.5 (*) 26.0 - 34.0 pg   MCHC 36.5 (*) 30.0 - 36.0 g/dL   RDW 29.5  62.1 - 30.8 %   Platelets 148 (*) 150 - 400 K/uL   Neutrophils Relative % 62  43 - 77 %   Neutro Abs 4.5  1.7 - 7.7 K/uL   Lymphocytes Relative 21  12 - 46 %   Lymphs Abs 1.5  0.7 - 4.0 K/uL   Monocytes Relative 16 (*) 3 - 12 %   Monocytes Absolute 1.1 (*) 0.1 - 1.0 K/uL   Eosinophils Relative 1  0 - 5  %   Eosinophils Absolute 0.1  0.0 - 0.7 K/uL   Basophils Relative 0  0 - 1 %   Basophils Absolute 0.0  0.0 - 0.1 K/uL  COMPREHENSIVE METABOLIC PANEL      Result Value Range   Sodium 129 (*) 135 - 145 mEq/L   Potassium 5.3 (*) 3.5 - 5.1 mEq/L   Chloride 97  96 - 112 mEq/L   CO2 23  19 - 32 mEq/L   Glucose, Bld 113 (*) 70 - 99 mg/dL   BUN 21  6 - 23 mg/dL   Creatinine, Ser 6.57  0.50 - 1.35 mg/dL   Calcium 8.9  8.4 - 84.6 mg/dL   Total Protein 8.6 (*) 6.0 - 8.3 g/dL   Albumin 3.1 (*) 3.5 - 5.2 g/dL   AST 40 (*) 0 - 37 U/L   ALT 13  0 - 53 U/L   Alkaline Phosphatase 44  39 - 117 U/L   Total Bilirubin 1.1  0.3 - 1.2 mg/dL   GFR calc non Af Amer 76 (*) >90 mL/min   GFR calc Af Amer 88 (*) >90 mL/min  URINALYSIS, ROUTINE W REFLEX MICROSCOPIC      Result Value Range   Color, Urine YELLOW  YELLOW   APPearance CLEAR  CLEAR   Specific Gravity, Urine 1.017  1.005 - 1.030   pH 5.5  5.0 - 8.0   Glucose, UA NEGATIVE  NEGATIVE mg/dL   Hgb urine dipstick NEGATIVE  NEGATIVE   Bilirubin Urine NEGATIVE  NEGATIVE  Ketones, ur NEGATIVE  NEGATIVE mg/dL   Protein, ur NEGATIVE  NEGATIVE mg/dL   Urobilinogen, UA 0.2  0.0 - 1.0 mg/dL   Nitrite NEGATIVE  NEGATIVE   Leukocytes, UA NEGATIVE  NEGATIVE  OCCULT BLOOD, POC DEVICE      Result Value Range   Fecal Occult Bld NEGATIVE  NEGATIVE  POCT I-STAT TROPONIN I      Result Value Range   Troponin i, poc 0.03  0.00 - 0.08 ng/mL   Comment 3            Dg Chest 2 View  05/12/2013   *RADIOLOGY REPORT*  Clinical Data: 79 year old male status post fall with pain in the sternum.  Chest pain.  CHEST - 2 VIEW, STERNUM - 2+ VIEW  Comparison:  03/04/2013 and earlier. Thoracolumbar radiographs from [facility] 11/14/2012.  Findings: Chest:  Stable lung volumes.  Stable cardiac size and mediastinal contours.  Sequelae of CABG.  Calcified pleural plaque or intravasation of bone cement near the right lung is stable.  No pneumothorax or pulmonary edema.  No  definite effusion. Osteopenia.  Chronic lower thoracic (favor T12) compression fractures status post augmentation.  Progressed adjacent lower thoracic compression fracture since 11/14/2012. No displaced rib fracture identified.  Sternum:  Sequelae of median sternotomy.  No acute or displaced sternal fracture is evident.  The anterior clear space is relatively preserved.  IMPRESSION: 1.  There is a chronic lower thoracic compression fracture status post augmentation.  Compression of the adjacent (probably T11) vertebra has progressed since 11/14/2012.  If specific therapy such as  vertebroplasty of this level is desired, nuclear medicine whole body bone scan or thoracic MRI would best determine acuity. 2.  Otherwise no acute cardiopulmonary abnormality or acute traumatic injury identified. 3.  Previous median sternotomy.  No acute sternal fracture identified.   Original Report Authenticated By: Erskine Speed, M.D.   Dg Sternum  05/12/2013   *RADIOLOGY REPORT*  Clinical Data: 79 year old male status post fall with pain in the sternum.  Chest pain.  CHEST - 2 VIEW, STERNUM - 2+ VIEW  Comparison:  03/04/2013 and earlier. Thoracolumbar radiographs from [facility] 11/14/2012.  Findings: Chest:  Stable lung volumes.  Stable cardiac size and mediastinal contours.  Sequelae of CABG.  Calcified pleural plaque or intravasation of bone cement near the right lung is stable.  No pneumothorax or pulmonary edema.  No definite effusion. Osteopenia.  Chronic lower thoracic (favor T12) compression fractures status post augmentation.  Progressed adjacent lower thoracic compression fracture since 11/14/2012. No displaced rib fracture identified.  Sternum:  Sequelae of median sternotomy.  No acute or displaced sternal fracture is evident.  The anterior clear space is relatively preserved.  IMPRESSION: 1.  There is a chronic lower thoracic compression fracture status post augmentation.  Compression of the adjacent (probably T11)  vertebra has progressed since 11/14/2012.  If specific therapy such as  vertebroplasty of this level is desired, nuclear medicine whole body bone scan or thoracic MRI would best determine acuity. 2.  Otherwise no acute cardiopulmonary abnormality or acute traumatic injury identified. 3.  Previous median sternotomy.  No acute sternal fracture identified.   Original Report Authenticated By: Erskine Speed, M.D.   Dg Thoracic Spine 2 View  05/12/2013   *RADIOLOGY REPORT*  Clinical Data: Fall.  Back pain.  THORACIC SPINE - 2 VIEW  Comparison: 11/14/2012  Findings: Moderate dextroconvex thoracic scoliosis.  The motion artifact blurs the thoracic spine on the lateral projection. Along with  the scoliosis causing obliquity of vertebral margins on lateral projection, this reduces diagnostic sensitivity.  Chronic compression fractures at T10 and T11 noted with vertebral augmentation at T11, similar to prior exams.  Thoracic kyphosis is present.  Bony demineralization noted.  Atherosclerotic aortic arch with evidence prior CABG. Suspected anterior wedging at T6, probably increased compared to 11/14/2012.  IMPRESSION:  1.  Anterior wedge compression fracture at T6, increased from 11/14/2012. 2.  No change in compression fractures at T10 and T11. 3.  Thoracic kyphosis and scoliosis with bony demineralization. 4.  Atherosclerosis.   Original Report Authenticated By: Gaylyn Rong, M.D.   Dg Lumbar Spine Complete  05/12/2013   *RADIOLOGY REPORT*  Clinical Data: Fall.  Midsternal pain.  Back pain.  LUMBAR SPINE - COMPLETE 4+ VIEW  Comparison: 04/04/2012  Findings: Levoconvex lumbar scoliosis noted.  Multilevel facet arthropathy observed lumbar spondylosis is present.  No new lumbar fracture or acute subluxation identified.  Aortoiliac atherosclerotic calcification is present.  IMPRESSION:  1.  No lumbar spine fracture or acute subluxation is observed. 2.  Bony demineralization with lumbar spondylosis and scoliosis. 3.   Atherosclerosis.   Original Report Authenticated By: Gaylyn Rong, M.D.   Ct Head Wo Contrast  05/12/2013   *RADIOLOGY REPORT*  Clinical Data:  Tripped and fell, small hematoma at top of head, alert and oriented, history hypertension  CT HEAD WITHOUT CONTRAST CT CERVICAL SPINE WITHOUT CONTRAST  Technique:  Multidetector CT imaging of the head and cervical spine was performed following the standard protocol without intravenous contrast.  Multiplanar CT image reconstructions of the cervical spine were also generated.  Comparison:  None  CT HEAD  Findings: Motion artifacts for which repeat imaging was performed. Generalized atrophy. Normal ventricular morphology. No midline shift or mass effect. Small vessel chronic ischemic changes of deep cerebral white matter. No intracranial hemorrhage, mass lesion, or acute infarction. Visualized paranasal sinuses and mastoid air cells clear. Bones unremarkable. Atherosclerotic calcifications at skull base.  IMPRESSION: Atrophy with small vessel chronic ischemic changes of deep cerebral white matter. No acute intracranial abnormalities.  CT CERVICAL SPINE  Findings: Motion artifacts, for which repeat imaging was performed. Prevertebral soft tissues normal thickness. Diffuse osseous demineralization. Disc space narrowing with endplate spur formation at C5-C6 and C6- C7. Vertebral body heights maintained. Mild scattered facet degenerative changes. No acute fracture, subluxation, or bone destruction. Scattered atherosclerotic calcifications. Uncovertebral degenerative changes notably right C5-C6 and C6-C7. Visualized skull base intact.  IMPRESSION: Osseous demineralization. Scattered degenerative disc and facet disease changes. No acute cervical spine abnormalities.   Original Report Authenticated By: Ulyses Southward, M.D.   Ct Cervical Spine Wo Contrast  05/12/2013   *RADIOLOGY REPORT*  Clinical Data:  Tripped and fell, small hematoma at top of head, alert and oriented,  history hypertension  CT HEAD WITHOUT CONTRAST CT CERVICAL SPINE WITHOUT CONTRAST  Technique:  Multidetector CT imaging of the head and cervical spine was performed following the standard protocol without intravenous contrast.  Multiplanar CT image reconstructions of the cervical spine were also generated.  Comparison:  None  CT HEAD  Findings: Motion artifacts for which repeat imaging was performed. Generalized atrophy. Normal ventricular morphology. No midline shift or mass effect. Small vessel chronic ischemic changes of deep cerebral white matter. No intracranial hemorrhage, mass lesion, or acute infarction. Visualized paranasal sinuses and mastoid air cells clear. Bones unremarkable. Atherosclerotic calcifications at skull base.  IMPRESSION: Atrophy with small vessel chronic ischemic changes of deep cerebral white matter. No acute intracranial abnormalities.  CT CERVICAL SPINE  Findings: Motion artifacts, for which repeat imaging was performed. Prevertebral soft tissues normal thickness. Diffuse osseous demineralization. Disc space narrowing with endplate spur formation at C5-C6 and C6- C7. Vertebral body heights maintained. Mild scattered facet degenerative changes. No acute fracture, subluxation, or bone destruction. Scattered atherosclerotic calcifications. Uncovertebral degenerative changes notably right C5-C6 and C6-C7. Visualized skull base intact.  IMPRESSION: Osseous demineralization. Scattered degenerative disc and facet disease changes. No acute cervical spine abnormalities.   Original Report Authenticated By: Ulyses Southward, M.D.   Dg Abd Acute W/chest  05/14/2013   *RADIOLOGY REPORT*  Clinical Data: Cough and chest pain  ACUTE ABDOMEN SERIES (ABDOMEN 2 VIEW & CHEST 1 VIEW)  Comparison: 05/12/2013  Findings: Chest:  Prior CABG.  Negative for heart failure.  Small pleural effusions bilaterally.  Mild bibasilar atelectasis. Calcified pleural plaque along the right hemidiaphragm is unchanged.  Sclerotic  right humeral head suggesting AVN.  Abdomen:  Negative for bowel obstruction.  No dilated bowel loops and no free air.  There is lumbar levoscoliosis.  Prior cement vertebral augmentation T11.  IMPRESSION: Bibasilar atelectasis and small pleural effusions bilaterally.  Nonobstructive bowel gas pattern.   Original Report Authenticated By: Janeece Riggers, M.D.   3:21 PM Case discussed with Ramiro Harvest, M.D., [facility].  Pt does not meet criteria for admission.  Pt is not orthostatic.  Consult with Social Work to see if he could get help at home.   4:40 PM Pt seen by Child psychotherapist, family does not want placement.  Will order home health assessment.  Released homel   1. Weakness   2. Falls frequently      Carleene Cooper III, MD 05/14/13 [phone]

## 2013-05-14 NOTE — ED Notes (Signed)
[facility] contacted for transport 

## 2013-05-14 NOTE — ED Notes (Signed)
Waiting for [facility] 

## 2013-05-14 NOTE — ED Notes (Signed)
Pt brought to ED by EMS with complaint of cough.As per pt he has been having nonproductive cough for months.He also complains of constipation.

## 2013-05-14 NOTE — ED Notes (Signed)
Long conversation with pt's wife re: various issues, including her limitations in managing pt at home/his recent falls.  Wife upset that pt is not going to be admitted and doesn't know how she will care for him.  Pt/wife refuse for pt to be placed in either an ALF or NH.  Pt has a privately hired caregiver who comes in at 7am and 7 pm each day.  Wife wants a 3-1 commode and a scooter or wheelchair for her husband and has called his PCP, Dr. Farris Has, re: securing these orders, but has not heard back from anyone from the office.  Pt/wife agreeable to [facility] and have no preference of a provider.  CSW spoke with Dr. Ignacia Palma who agreed to order [facility], PT, CNA, SW for pt.  CSW will f/u with RNCM in the am who will work on making the [facility] referral.  Pt/wife very anxious about being kept so long in ED/wanting to go home.  CSW escorted wife to front of ED to wait on her cab/[facility] called for pt.  Emotional support offered.

## 2013-05-18 NOTE — Care Management Note (Signed)
    Page 1 of 1   05/15/2013     2:41:33 PM   CARE MANAGEMENT NOTE 05/15/2013  Patient:  Andre Mueller, Andre Mueller   Account Number:  [id number]  Date Initiated:  05/15/2013  Documentation initiated by:  Osten Janek  Subjective/Objective Assessment:     Action/Plan:   Anticipated DC Date:     Anticipated DC Plan:           [facility] Choice  HOME HEALTH   Choice offered to / List presented to:  C-3 Spouse        HH arranged  HH-1 RN  HH-2 PT  HH-6 SOCIAL WORKER  HH-4 NURSE'S AIDE      HH agency  [facility]   Status of service:   Medicare Important Message given?   (If response is "NO", the following Medicare IM given date fields will be blank) Date Medicare IM given:   Date Additional Medicare IM given:    Discharge Disposition:    Per UR Regulation:    If discussed at Long Length of Stay Meetings, dates discussed:    Comments:  Spoke with patient and patients spouse regarding home health services.  [facility] contacted for referal. Verified patients PCP to be Farris Has MD with [facility].

## 2013-05-21 ENCOUNTER — Emergency Department (HOSPITAL_COMMUNITY): Payer: Medicare Other

## 2013-05-21 ENCOUNTER — Emergency Department (HOSPITAL_COMMUNITY)
Admission: EM | Admit: 2013-05-21 | Discharge: 2013-05-21 | Disposition: A | Discharge status: Home / Self Care | Payer: Medicare Other | Attending: Emergency Medicine | Admitting: Emergency Medicine

## 2013-05-21 ENCOUNTER — Encounter (HOSPITAL_COMMUNITY): Payer: Self-pay | Admitting: Cardiology

## 2013-05-21 DIAGNOSIS — Z8719 Personal history of other diseases of the digestive system: Secondary | ICD-10-CM | POA: Insufficient documentation

## 2013-05-21 DIAGNOSIS — I2510 Atherosclerotic heart disease of native coronary artery without angina pectoris: Secondary | ICD-10-CM | POA: Insufficient documentation

## 2013-05-21 DIAGNOSIS — Z79899 Other long term (current) drug therapy: Secondary | ICD-10-CM | POA: Insufficient documentation

## 2013-05-21 DIAGNOSIS — Z880 Allergy status to penicillin: Secondary | ICD-10-CM | POA: Insufficient documentation

## 2013-05-21 DIAGNOSIS — R110 Nausea: Secondary | ICD-10-CM | POA: Insufficient documentation

## 2013-05-21 DIAGNOSIS — F411 Generalized anxiety disorder: Secondary | ICD-10-CM | POA: Insufficient documentation

## 2013-05-21 DIAGNOSIS — H409 Unspecified glaucoma: Secondary | ICD-10-CM | POA: Insufficient documentation

## 2013-05-21 DIAGNOSIS — Z9889 Other specified postprocedural states: Secondary | ICD-10-CM | POA: Insufficient documentation

## 2013-05-21 DIAGNOSIS — Z7982 Long term (current) use of aspirin: Secondary | ICD-10-CM | POA: Insufficient documentation

## 2013-05-21 DIAGNOSIS — M810 Age-related osteoporosis without current pathological fracture: Secondary | ICD-10-CM | POA: Insufficient documentation

## 2013-05-21 DIAGNOSIS — I10 Essential (primary) hypertension: Secondary | ICD-10-CM | POA: Insufficient documentation

## 2013-05-21 DIAGNOSIS — R072 Precordial pain: Secondary | ICD-10-CM | POA: Insufficient documentation

## 2013-05-21 DIAGNOSIS — E7800 Pure hypercholesterolemia, unspecified: Secondary | ICD-10-CM | POA: Insufficient documentation

## 2013-05-21 DIAGNOSIS — Z951 Presence of aortocoronary bypass graft: Secondary | ICD-10-CM | POA: Insufficient documentation

## 2013-05-21 DIAGNOSIS — IMO0002 Reserved for concepts with insufficient information to code with codable children: Secondary | ICD-10-CM | POA: Insufficient documentation

## 2013-05-21 DIAGNOSIS — Z87891 Personal history of nicotine dependence: Secondary | ICD-10-CM | POA: Insufficient documentation

## 2013-05-21 DIAGNOSIS — R079 Chest pain, unspecified: Secondary | ICD-10-CM

## 2013-05-21 LAB — CBC WITH DIFFERENTIAL/PLATELET
Basophils Absolute: 0 10*3/uL (ref 0.0–0.1)
Basophils Relative: 0 % (ref 0–1)
Eosinophils Absolute: 0.1 10*3/uL (ref 0.0–0.7)
Eosinophils Relative: 1 % (ref 0–5)
HCT: 32 % — ABNORMAL LOW (ref 39.0–52.0)
Hemoglobin: 11.1 g/dL — ABNORMAL LOW (ref 13.0–17.0)
Lymphocytes Relative: 16 % (ref 12–46)
Lymphs Abs: 1.2 10*3/uL (ref 0.7–4.0)
MCH: 33.1 pg (ref 26.0–34.0)
MCHC: 34.7 g/dL (ref 30.0–36.0)
MCV: 95.5 fL (ref 78.0–100.0)
Monocytes Absolute: 1 10*3/uL (ref 0.1–1.0)
Monocytes Relative: 13 % — ABNORMAL HIGH (ref 3–12)
Neutro Abs: 5.4 10*3/uL (ref 1.7–7.7)
Neutrophils Relative %: 70 % (ref 43–77)
Platelets: 186 10*3/uL (ref 150–400)
RBC: 3.35 MIL/uL — ABNORMAL LOW (ref 4.22–5.81)
RDW: 14.3 % (ref 11.5–15.5)
WBC: 7.7 10*3/uL (ref 4.0–10.5)

## 2013-05-21 LAB — POCT I-STAT TROPONIN I
Troponin i, poc: 0.07 ng/mL (ref 0.00–0.08)
Troponin i, poc: 0.03 ng/mL (ref 0.00–0.08)

## 2013-05-21 LAB — BASIC METABOLIC PANEL
CO2: 23 mEq/L (ref 19–32)
Chloride: 99 mEq/L (ref 96–112)
Sodium: 134 mEq/L — ABNORMAL LOW (ref 135–145)

## 2013-05-21 MED ORDER — NITROGLYCERIN 0.4 MG SL SUBL
0.4000 mg | SUBLINGUAL_TABLET | SUBLINGUAL | Status: DC | PRN
  Administered 2013-05-21: 0.4 mg via SUBLINGUAL
  Filled 2013-05-21: qty 25

## 2013-05-21 NOTE — ED Notes (Signed)
Pt to department via EMS from home- pt reports he started having midsternal chest pain that started last night. Reports that it has been constant since that time. Pt states that he just told his wife this afternoon about the pain. Reports some nausea. Reports cardiac hx. 324 [facility] PTA. Bp- 153/93 Hr-90

## 2013-05-21 NOTE — ED Provider Notes (Signed)
CSN: [id number]     Arrival date & time 05/21/13  1706 History     First MD Initiated Contact with Patient 05/21/13 1758     Chief Complaint  Patient presents with  . Chest Pain   (Consider location/radiation/quality/duration/timing/severity/associated sxs/prior Treatment) HPI Comments: Pt w/ PMHx of CAD s/p CABG 20 yrs ago in [place], also hx of HLD, HTN now w/ chest pain. States verbal altercation w/ family this am, became anxious and "worked up". Noted acute onset substernal pressure a/w mild nausea. Pain has been constant, exacerbated w/ exertion. At times admits to sharp pains radiating across precordium. Denies dyspnea or diaphoresis. No ripping tearing pain radiating to back. Denies trauma or fever. States pain has been constant all day since 9am and gradually improving.   Patient is a 79 y.o. male presenting with chest pain. The history is provided by the patient. No language interpreter was used.  Chest Pain Pain location:  Substernal area Pain quality: aching and pressure   Pain radiates to:  Precordial region Pain radiates to the back: no   Pain severity:  Mild Onset quality:  Sudden Timing:  Constant Progression:  Partially resolved Chronicity:  New Relieved by:  Nothing Worsened by:  Nothing tried Ineffective treatments:  None tried Associated symptoms: no abdominal pain, no cough, no dizziness, no fever, no headache, no nausea, no shortness of breath and not vomiting     Past Medical History  Diagnosis Date  . Hypercholesteremia   . Anxiety   . GERD (gastroesophageal reflux disease)   . Glaucoma   . Esophageal tear 1990's?  . Hypertension   . Constipation   . Cancer     hx of waldrens disease  . Benign neoplasm of colon 02/2012    polyps x 2 adenomatous w/o high grade dysplasia  . CAD (coronary artery disease) 2007    s/p CABG in [place]  . Diverticulosis of colon (without mention of hemorrhage)     hx LGIB, 04/2012 hosp  . History of resection of small bowel    . Osteoporosis, senile     hx T11 compression fx 02/2012, s/p KP   Past Surgical History  Procedure Laterality Date  . Cardiac surgery    . Appendectomy    . Hernia repair    . Colonoscopy  02/29/2012    Procedure: COLONOSCOPY;  Surgeon: Hart Carwin, MD;  Location: [facility];  Service: Endoscopy;  Laterality: N/A;  . Esophagus surgery      due to esophageal tear  . Small intestine surgery      20 yrs ago; in [place]  . Coronary artery bypass graft      > 6 yrs  . Kyphosis surgery     Family History  Problem Relation Age of Onset  . Colon cancer Neg Hx   . Malignant hyperthermia Neg Hx    History  Substance Use Topics  . Smoking status: Former Smoker    Quit date: 09/19/1962  . 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 Alcohol Use: No    Review of Systems  Constitutional: Negative for fever and chills.  HENT: Negative for congestion and sore throat.   Respiratory: Negative for cough and shortness of breath.   Cardiovascular: Positive for chest  pain. Negative for leg swelling.  Gastrointestinal: Negative for nausea, vomiting, abdominal pain, diarrhea and constipation.  Genitourinary: Negative for dysuria and frequency.  Skin: Negative for color change and rash.  Neurological: Negative for dizziness and headaches.  Psychiatric/Behavioral: Negative for confusion and agitation.  All other systems reviewed and are negative.    Allergies  Penicillins  Home Medications   Current Outpatient Rx  Name  Route  Sig  Dispense  Refill  . amLODipine-benazepril (LOTREL) 5-40 MG per capsule   Oral   Take 1 capsule by mouth daily.         Marland Kitchen aspirin EC 81 MG tablet   Oral   Take 1 tablet (81 mg total) by mouth daily.           Stop aspirin x 1 week, resume  05/16/12   . bisoprolol-hydrochlorothiazide (ZIAC) 5-6.25 MG per tablet   Oral   Take 1 tablet by mouth daily.         . fluticasone (FLONASE) 50 MCG/ACT nasal spray   Nasal   Place 1 spray into the nose daily as needed. allergies         . Multiple Vitamin (MULITIVITAMIN WITH MINERALS) TABS   Oral   Take 1 tablet by mouth daily.         . nitroGLYCERIN (NITROSTAT) 0.4 MG SL tablet   Sublingual   Place 0.4 mg under the tongue every 5 (five) minutes as needed. Chest pain         . OVER THE COUNTER MEDICATION   Oral   Take 1 tablet by mouth daily as needed (Cough).         . OVER THE COUNTER MEDICATION   Topical   Apply 1 application topically at bedtime. Apply ointment to back each evening for pain.         Marland Kitchen senna (SENOKOT) 8.6 MG TABS   Oral   Take 2 tablets by mouth 2 (two) times daily.         . simvastatin (ZOCOR) 10 MG tablet   Oral   Take 10 mg by mouth daily.         . traMADol (ULTRAM) 50 MG tablet   Oral   Take 1 tablet (50 mg total) by mouth every 8 (eight) hours as needed for pain.   15 tablet   0    BP 148/71  Pulse 88  Temp(Src) 98.2 F (36.8 C) (Oral)  Resp 18  SpO2 96% Physical Exam  Constitutional: He is oriented to person, place, and time. He appears well-developed and well-nourished. No distress.  HENT:  Head: Normocephalic and atraumatic.  Eyes: EOM are normal. Pupils are equal, round, and reactive to light.  Neck: Normal range of motion. Neck supple.  Cardiovascular: Normal rate and regular rhythm.   Pulses:      Radial pulses are 2+ on the right side, and 2+ on the left side.  Pulmonary/Chest: Effort normal and breath sounds normal. No respiratory distress.  Abdominal: Soft. He exhibits no distension.  Musculoskeletal: Normal range of motion. He exhibits no edema.       Right lower leg: He exhibits no edema.       Left lower leg: He exhibits no edema.  Neurological: He is alert and oriented to person, place, and time.   Skin: Skin is warm and dry.  Psychiatric: He has a normal mood and affect. His behavior is normal.    ED Course   Procedures (including critical  care time)  Results for orders placed during the hospital encounter of 05/21/13  CBC WITH DIFFERENTIAL      Result Value Range   WBC 7.7  4.0 - 10.5 K/uL   RBC 3.35 (*) 4.22 - 5.81 MIL/uL   Hemoglobin 11.1 (*) 13.0 - 17.0 g/dL   HCT 16.1 (*) 09.6 - 04.5 %   MCV 95.5  78.0 - 100.0 fL   MCH 33.1  26.0 - 34.0 pg   MCHC 34.7  30.0 - 36.0 g/dL   RDW 40.9  81.1 - 91.4 %   Platelets 186  150 - 400 K/uL   Neutrophils Relative % 70  43 - 77 %   Neutro Abs 5.4  1.7 - 7.7 K/uL   Lymphocytes Relative 16  12 - 46 %   Lymphs Abs 1.2  0.7 - 4.0 K/uL   Monocytes Relative 13 (*) 3 - 12 %   Monocytes Absolute 1.0  0.1 - 1.0 K/uL   Eosinophils Relative 1  0 - 5 %   Eosinophils Absolute 0.1  0.0 - 0.7 K/uL   Basophils Relative 0  0 - 1 %   Basophils Absolute 0.0  0.0 - 0.1 K/uL  BASIC METABOLIC PANEL      Result Value Range   Sodium 134 (*) 135 - 145 mEq/L   Potassium 3.3 (*) 3.5 - 5.1 mEq/L   Chloride 99  96 - 112 mEq/L   CO2 23  19 - 32 mEq/L   Glucose, Bld 119 (*) 70 - 99 mg/dL   BUN 22  6 - 23 mg/dL   Creatinine, Ser 7.82  0.50 - 1.35 mg/dL   Calcium 9.0  8.4 - 95.6 mg/dL   GFR calc non Af Amer 83 (*) >90 mL/min   GFR calc Af Amer >90  >90 mL/min  POCT I-STAT TROPONIN I      Result Value Range   Troponin i, poc 0.07  0.00 - 0.08 ng/mL   Comment 3           POCT I-STAT TROPONIN I      Result Value Range   Troponin i, poc 0.03  0.00 - 0.08 ng/mL   Comment 3            DG Chest Portable 1 View (Final result)  Result time: 05/21/13 19:11:30    Final result by Rad Results In Interface (05/21/13 19:11:30)    Narrative:   *RADIOLOGY REPORT*  Clinical Data: Chest pain.  PORTABLE CHEST - 1 VIEW  Comparison: Two-view chest 05/12/2013.  Findings: The heart is mildly enlarged. Mild bibasilar atelectasis is present. Small effusions are  suspected . No other significant airspace disease is present. Chronic sclerotic changes are again noted in the proximal left to right humerus.  IMPRESSION:  1. Cardiomegaly without failure. 2. Mild bibasilar airspace disease likely reflects atelectasis. Infection is not excluded.   Original Report Authenticated By: Marin Roberts, M.D.            No results found. No diagnosis found.  MDM  Exam as above, NAD, no hypoxia or resp distress, no tachycardia or hypertension. Pain mostly resolved. Already received ASA 325mg , given SL nitro. ECG w/out acute ischemia. No STEMI, initial troponin .07, repeat troponin after 3 hrs 0.03, labs otherwise unremarkable. CXR - NACPF,  D/w pt, recommend admit for ACS rule out but pt refuses. Pt is high risk, negative troponin x 2 reassuring in light of duration of pain and atypical nature. Pt is  pain free. Will d/c home and recommend close fup w/ pcp tomorrow. Given strict return precautions. D/c in good condition. At this time doubt ACS, PE, dissection, PTX, boerhaave, or tamponade.  I have personally reviewed labs and imaging and considered in my MDM. Case d/w Dr Judd Lien  1. Chest pain    Discharge Medication List as of 05/21/2013  9:22 PM     Newt Lukes, MD [address] [place] [address] [phone]  Schedule an appointment as soon as possible for a visit     Audelia Hives, MD 05/22/13 [phone]

## 2013-05-22 NOTE — ED Provider Notes (Signed)
I saw and evaluated the patient, reviewed the resident's note and I agree with the findings and plan. The patient presents to the ED for evaluation after an episode of chest pain.  He had some form of upsetting dispute with his family prior to the onset of his symptoms.  He denies any shortness of breath or diaphoresis.  The discomfort has since resolved and he feels fine now.  He has a history of CAD with CABG in the past, about 15 years ago and has been doing well since that time.    On exam, the vitals are stable and the patient is afebrile.  The heart is regular rate and rhythm and the lungs are clear.  The abdomen is soft, non-tender, and non-distended.  The extremities are without edema.    The workup reveals an unchanged ekg, troponin initially of 0.07.  This was repeated two hours later and was found to be 0.03.  He had no further chest discomfort in the ED.  Our initial plan was to admit the patient due to his history and nature of his symptoms.  The patient and son did not want this and request discharge.  They both understand that a cardiac etiology has not been ruled out and will assume the risks associated with going home.  He will return if his symptoms worsen.    Geoffery Lyons, MD 05/22/13 [phone]

## 2013-07-01 ENCOUNTER — Other Ambulatory Visit (HOSPITAL_COMMUNITY): Payer: Self-pay | Admitting: Gastroenterology

## 2013-07-01 DIAGNOSIS — R1310 Dysphagia, unspecified: Secondary | ICD-10-CM

## 2013-07-03 ENCOUNTER — Ambulatory Visit (HOSPITAL_COMMUNITY): Payer: Medicare Other

## 2013-07-07 ENCOUNTER — Ambulatory Visit (HOSPITAL_COMMUNITY): Payer: Medicare Other

## 2013-07-07 ENCOUNTER — Other Ambulatory Visit (HOSPITAL_COMMUNITY): Payer: Medicare Other

## 2013-07-09 ENCOUNTER — Ambulatory Visit (HOSPITAL_COMMUNITY)
Admission: RE | Admit: 2013-07-09 | Discharge: 2013-07-09 | Disposition: A | Discharge status: Home / Self Care | Payer: Medicare Other | Source: Ambulatory Visit | Attending: Gastroenterology | Admitting: Gastroenterology

## 2013-07-09 DIAGNOSIS — R1310 Dysphagia, unspecified: Secondary | ICD-10-CM

## 2013-07-09 DIAGNOSIS — K219 Gastro-esophageal reflux disease without esophagitis: Secondary | ICD-10-CM | POA: Insufficient documentation

## 2013-07-09 DIAGNOSIS — R1313 Dysphagia, pharyngeal phase: Secondary | ICD-10-CM | POA: Insufficient documentation

## 2013-07-09 DIAGNOSIS — I10 Essential (primary) hypertension: Secondary | ICD-10-CM | POA: Insufficient documentation

## 2013-07-09 NOTE — Procedures (Signed)
Objective Swallowing Evaluation: Modified Barium Swallowing Study  Patient Details  Name: Andre Mueller MRN: [id number] Date of Birth: Nov 08, 1920  Today's Date: 07/09/2013 Time: 1020-1100 SLP Time Calculation (min): 40 min  Past Medical History:  Past Medical History  Diagnosis Date  . Hypercholesteremia   . Anxiety   . GERD (gastroesophageal reflux disease)   . Glaucoma   . Esophageal tear 1990's?  . Hypertension   . Constipation   . Cancer     hx of waldrens disease  . Benign neoplasm of colon 02/2012    polyps x 2 adenomatous w/o high grade dysplasia  . CAD (coronary artery disease) 2007    s/p CABG in [place]  . Diverticulosis of colon (without mention of hemorrhage)     hx LGIB, 04/2012 hosp  . History of resection of small bowel   . Osteoporosis, senile     hx T11 compression fx 02/2012, s/p KP   Past Surgical History:  Past Surgical History  Procedure Laterality Date  . Cardiac surgery    . Appendectomy    . Hernia repair    . Colonoscopy  02/29/2012    Procedure: COLONOSCOPY;  Surgeon: Hart Carwin, MD;  Location: [facility];  Service: Endoscopy;  Laterality: N/A;  . Esophagus surgery      due to esophageal tear  . Small intestine surgery      20 yrs ago; in [place]  . Coronary artery bypass graft      > 6 yrs  . Kyphosis surgery     HPI:  Pt is an 79 year old male, living in Independent Living, arriving for an outpatient MBS recommended by Dr. Dulce Sellar of [facility]. Pt reports frequent productive coughing though he feels this is related to sinus drainage. He denis globus or regurgitation and says coughing is not necessarily related to eating. He wife however reports that pts will often have hard coughing at the end of a meal, expectorating large quantities of foamy mucous though it doesnt ahve food in it. Pt denies any recent pna, but does report his esophagus burst requiring surgery. No history of CVA and no dx of GERD per pt     Assessment / Plan /  Recommendation Clinical Impression  Dysphagia Diagnosis: Suspected primary esophageal dysphagia;Moderate pharyngeal phase dysphagia Clinical impression: Pt presents with a moderate pharyngeal dysphagia as well as concern for a significant esophageal impairment. Pt observed to orally manipulate POs well, though there is a slight delay in airway protection with thin liquids leading to trace silent penetration to the cords in 50% of trials. A chin tuck worsened aspiration, but a cued throat clear removed most penetrate/aspirate. In between swallows with thin and nectar thick, barium on cords increased though residuals were trace to mild and no significant pharyngeal weakness noted. Question if pt producing copious secretions which were mixing with barium and contacting cords? Also concerned about secretions as pt began drooling during session, suspect waterbrash.   Appearance of significant stasis in mid to distal esophagus. Barium tablet also appeared to lodge at GE junction despite efforts to transit. Believe pts compaint of coughing at end of meal and expectoration of mucous likely more related to poor transit of solid POs through esophagus. Hopeful that Dr. Dulce Sellar will f/u with pt on esophgeal function. For now, SLP made recommendations to pt to continue current diet with a volitional throat clear after every few sips to expel aspirate and also educated pt on esophageal precautions and strategies. Aspiration risk is  moderate, but thickened liquids will not reduce risk. Perhaps treatment of esophageal concerns may improve pharyngeal function.     Treatment Recommendation  No treatment recommended at this time    Diet Recommendation Regular;Thin liquid   Liquid Administration via: Cup Medication Administration: Crushed with puree Supervision: Patient able to self feed;Intermittent supervision to cue for compensatory strategies Compensations: Slow rate;Small sips/bites;Clear throat intermittently;Follow  solids with liquid Postural Changes and/or Swallow Maneuvers: Seated upright 90 degrees;Upright 30-60 min after meal    Other  Recommendations Recommended Consults: Consider esophageal assessment;Consider GI evaluation Oral Care Recommendations: Patient independent with oral care   Follow Up Recommendations  None (Pt may benefit from f/u SLP after GI visit)    Frequency and Duration        Pertinent Vitals/Pain NA    SLP Swallow Goals     General HPI: Pt is an 79 year old male, living in Independent Living, arriving for an outpatient MBS recommended by Dr. Dulce Sellar of [facility]. Pt reports frequent productive coughing though he feels this is related to sinus drainage. He denis globus or regurgitation and says coughing is not necessarily related to eating. He wife however reports that pts will often have hard coughing at the end of a meal, expectorating large quantities of foamy mucous though it doesnt ahve food in it. Pt denies any recent pna, but does report his esophagus burst requiring surgery. No history of CVA and no dx of GERD per pt Type of Study: Modified Barium Swallowing Study Reason for Referral: Objectively evaluate swallowing function Diet Prior to this Study: Regular;Thin liquids Temperature Spikes Noted: No Respiratory Status: Room air History of Recent Intubation: No Behavior/Cognition: Alert;Cooperative;Pleasant mood Oral Cavity - Dentition: Dentures, top;Dentures, bottom Oral Motor / Sensory Function: Within functional limits Self-Feeding Abilities: Able to feed self Patient Positioning: Upright in chair Baseline Vocal Quality: Clear Volitional Cough: Strong Volitional Swallow: Able to elicit Anatomy: Within functional limits Pharyngeal Secretions: Not observed secondary MBS    Reason for Referral Objectively evaluate swallowing function   Oral Phase Oral Preparation/Oral Phase Oral Phase: WFL   Pharyngeal Phase Pharyngeal Phase Pharyngeal Phase:  Impaired Pharyngeal - Nectar Pharyngeal - Nectar Cup: Delayed swallow initiation;Pharyngeal residue - valleculae;Pharyngeal residue - pyriform sinuses;Pharyngeal residue - posterior pharnyx;Penetration/Aspiration after swallow (minimal residuals) Penetration/Aspiration details (nectar cup): Material enters airway, passes BELOW cords without attempt by patient to eject out (silent aspiration);Material does not enter airway Pharyngeal - Thin Pharyngeal - Thin Cup: Delayed swallow initiation;Reduced airway/laryngeal closure;Penetration/Aspiration before swallow;Penetration/Aspiration after swallow;Moderate aspiration;Trace aspiration;Pharyngeal residue - valleculae;Pharyngeal residue - pyriform sinuses;Pharyngeal residue - posterior pharnyx Penetration/Aspiration details (thin cup): Material enters airway, CONTACTS cords and not ejected out;Material enters airway, passes BELOW cords without attempt by patient to eject out (silent aspiration);Material does not enter airway Pharyngeal - Thin Straw: Delayed swallow initiation;Reduced airway/laryngeal closure;Penetration/Aspiration before swallow;Penetration/Aspiration after swallow;Moderate aspiration;Trace aspiration;Pharyngeal residue - valleculae;Pharyngeal residue - pyriform sinuses;Pharyngeal residue - posterior pharnyx Penetration/Aspiration details (thin straw): Material does not enter airway;Material enters airway, CONTACTS cords and not ejected out;Material enters airway, passes BELOW cords without attempt by patient to eject out (silent aspiration) Pharyngeal - Solids Pharyngeal - Puree: Within functional limits Pharyngeal - Regular: Within functional limits Pharyngeal - Pill: Delayed swallow initiation  Cervical Esophageal Phase    GO    Cervical Esophageal Phase Cervical Esophageal Phase: Impaired Cervical Esophageal Phase - Comment Cervical Esophageal Comment: see impression statement    Functional Assessment Tool Used: clinical  judgement Functional Limitations: Swallowing Swallow Current Status (Z6109):  At least 40 percent but less than 60 percent impaired, limited or restricted Swallow Goal Status [phone]): At least 40 percent but less than 60 percent impaired, limited or restricted Swallow Discharge Status [phone]): At least 40 percent but less than 60 percent impaired, limited or restricted   [facility], MA CCC-SLP [phone]   Claudine Mouton 07/09/2013, 2:26 PM

## 2013-07-13 ENCOUNTER — Other Ambulatory Visit: Payer: Self-pay | Admitting: Gastroenterology

## 2013-07-13 DIAGNOSIS — R1310 Dysphagia, unspecified: Secondary | ICD-10-CM

## 2013-07-19 ENCOUNTER — Encounter (HOSPITAL_COMMUNITY)
Admission: EM | Disposition: A | Discharge status: Skilled Nursing Facility | Payer: Self-pay | Source: Home / Self Care | Attending: Internal Medicine

## 2013-07-19 ENCOUNTER — Emergency Department (HOSPITAL_COMMUNITY): Payer: Medicare Other

## 2013-07-19 ENCOUNTER — Inpatient Hospital Stay (HOSPITAL_COMMUNITY)
Admission: EM | Admit: 2013-07-19 | Discharge: 2013-07-23 | DRG: 481 | Disposition: A | Discharge status: Skilled Nursing Facility | Payer: Medicare Other | Attending: Internal Medicine | Admitting: Internal Medicine

## 2013-07-19 ENCOUNTER — Encounter (HOSPITAL_COMMUNITY): Payer: Self-pay | Admitting: Anesthesiology

## 2013-07-19 ENCOUNTER — Inpatient Hospital Stay (HOSPITAL_COMMUNITY): Payer: Medicare Other | Admitting: Anesthesiology

## 2013-07-19 ENCOUNTER — Encounter (HOSPITAL_COMMUNITY): Payer: Self-pay | Admitting: Radiology

## 2013-07-19 ENCOUNTER — Inpatient Hospital Stay (HOSPITAL_COMMUNITY): Payer: Medicare Other

## 2013-07-19 DIAGNOSIS — W19XXXA Unspecified fall, initial encounter: Secondary | ICD-10-CM | POA: Diagnosis present

## 2013-07-19 DIAGNOSIS — S72002A Fracture of unspecified part of neck of left femur, initial encounter for closed fracture: Secondary | ICD-10-CM

## 2013-07-19 DIAGNOSIS — M810 Age-related osteoporosis without current pathological fracture: Secondary | ICD-10-CM | POA: Diagnosis present

## 2013-07-19 DIAGNOSIS — Y9210 Unspecified residential institution as the place of occurrence of the external cause: Secondary | ICD-10-CM | POA: Diagnosis present

## 2013-07-19 DIAGNOSIS — Z951 Presence of aortocoronary bypass graft: Secondary | ICD-10-CM

## 2013-07-19 DIAGNOSIS — D696 Thrombocytopenia, unspecified: Secondary | ICD-10-CM | POA: Diagnosis not present

## 2013-07-19 DIAGNOSIS — Y9301 Activity, walking, marching and hiking: Secondary | ICD-10-CM

## 2013-07-19 DIAGNOSIS — K219 Gastro-esophageal reflux disease without esophagitis: Secondary | ICD-10-CM | POA: Diagnosis present

## 2013-07-19 DIAGNOSIS — K5730 Diverticulosis of large intestine without perforation or abscess without bleeding: Secondary | ICD-10-CM

## 2013-07-19 DIAGNOSIS — I2510 Atherosclerotic heart disease of native coronary artery without angina pectoris: Secondary | ICD-10-CM | POA: Diagnosis present

## 2013-07-19 DIAGNOSIS — F411 Generalized anxiety disorder: Secondary | ICD-10-CM | POA: Diagnosis present

## 2013-07-19 DIAGNOSIS — E785 Hyperlipidemia, unspecified: Secondary | ICD-10-CM

## 2013-07-19 DIAGNOSIS — I10 Essential (primary) hypertension: Secondary | ICD-10-CM | POA: Diagnosis present

## 2013-07-19 DIAGNOSIS — S72143A Displaced intertrochanteric fracture of unspecified femur, initial encounter for closed fracture: Principal | ICD-10-CM | POA: Diagnosis present

## 2013-07-19 DIAGNOSIS — S72009A Fracture of unspecified part of neck of unspecified femur, initial encounter for closed fracture: Secondary | ICD-10-CM

## 2013-07-19 DIAGNOSIS — R531 Weakness: Secondary | ICD-10-CM

## 2013-07-19 DIAGNOSIS — D126 Benign neoplasm of colon, unspecified: Secondary | ICD-10-CM

## 2013-07-19 DIAGNOSIS — E7800 Pure hypercholesterolemia, unspecified: Secondary | ICD-10-CM | POA: Diagnosis present

## 2013-07-19 DIAGNOSIS — D62 Acute posthemorrhagic anemia: Secondary | ICD-10-CM | POA: Diagnosis not present

## 2013-07-19 DIAGNOSIS — I959 Hypotension, unspecified: Secondary | ICD-10-CM | POA: Diagnosis present

## 2013-07-19 DIAGNOSIS — K922 Gastrointestinal hemorrhage, unspecified: Secondary | ICD-10-CM

## 2013-07-19 DIAGNOSIS — H409 Unspecified glaucoma: Secondary | ICD-10-CM | POA: Diagnosis present

## 2013-07-19 DIAGNOSIS — S72002D Fracture of unspecified part of neck of left femur, subsequent encounter for closed fracture with routine healing: Secondary | ICD-10-CM

## 2013-07-19 DIAGNOSIS — Z9049 Acquired absence of other specified parts of digestive tract: Secondary | ICD-10-CM

## 2013-07-19 DIAGNOSIS — D649 Anemia, unspecified: Secondary | ICD-10-CM

## 2013-07-19 DIAGNOSIS — Z87891 Personal history of nicotine dependence: Secondary | ICD-10-CM

## 2013-07-19 DIAGNOSIS — K5900 Constipation, unspecified: Secondary | ICD-10-CM | POA: Diagnosis not present

## 2013-07-19 DIAGNOSIS — Z7982 Long term (current) use of aspirin: Secondary | ICD-10-CM

## 2013-07-19 DIAGNOSIS — Z79899 Other long term (current) drug therapy: Secondary | ICD-10-CM

## 2013-07-19 HISTORY — PX: ORIF HIP FRACTURE: SHX2125

## 2013-07-19 LAB — PROTIME-INR
INR: 1.24 (ref 0.00–1.49)
Prothrombin Time: 15.3 seconds — ABNORMAL HIGH (ref 11.6–15.2)

## 2013-07-19 LAB — BASIC METABOLIC PANEL
CO2: 26 mEq/L (ref 19–32)
Calcium: 8.9 mg/dL (ref 8.4–10.5)
Chloride: 97 mEq/L (ref 96–112)
GFR calc Af Amer: 85 mL/min — ABNORMAL LOW (ref >90)
Sodium: 133 mEq/L — ABNORMAL LOW (ref 135–145)

## 2013-07-19 LAB — CBC WITH DIFFERENTIAL/PLATELET
Basophils Absolute: 0 10*3/uL (ref 0.0–0.1)
Lymphocytes Relative: 14 % (ref 12–46)
Neutro Abs: 7.4 10*3/uL (ref 1.7–7.7)
Platelets: 155 10*3/uL (ref 150–400)
RDW: 14.3 % (ref 11.5–15.5)
WBC: 9.6 10*3/uL (ref 4.0–10.5)

## 2013-07-19 SURGERY — OPEN REDUCTION INTERNAL FIXATION HIP
Anesthesia: General | Site: Hip | Laterality: Left | Wound class: Clean

## 2013-07-19 MED ORDER — ONDANSETRON HCL 4 MG/2ML IJ SOLN
4.0000 mg | Freq: Once | INTRAMUSCULAR | Status: AC
  Administered 2013-07-19: 4 mg via INTRAVENOUS
  Filled 2013-07-19: qty 2

## 2013-07-19 MED ORDER — MORPHINE SULFATE 2 MG/ML IJ SOLN
2.0000 mg | Freq: Once | INTRAMUSCULAR | Status: AC
  Administered 2013-07-19: 2 mg via INTRAVENOUS
  Filled 2013-07-19: qty 1

## 2013-07-19 MED ORDER — SUCCINYLCHOLINE CHLORIDE 20 MG/ML IJ SOLN
INTRAMUSCULAR | Status: DC | PRN
  Administered 2013-07-19: 90 mg via INTRAVENOUS

## 2013-07-19 MED ORDER — PROPOFOL 10 MG/ML IV BOLUS
INTRAVENOUS | Status: DC | PRN
  Administered 2013-07-19: 90 mg via INTRAVENOUS

## 2013-07-19 MED ORDER — LIDOCAINE HCL (CARDIAC) 20 MG/ML IV SOLN
INTRAVENOUS | Status: DC | PRN
  Administered 2013-07-19: 40 mg via INTRAVENOUS

## 2013-07-19 MED ORDER — ACETAMINOPHEN 500 MG PO TABS: 500.0000 mg | ORAL_TABLET | Freq: Four times a day (QID) | ORAL | Status: DC | PRN

## 2013-07-19 MED ORDER — PHENYLEPHRINE HCL 10 MG/ML IJ SOLN
INTRAMUSCULAR | Status: DC | PRN
  Administered 2013-07-19 (×5): 40 ug via INTRAVENOUS

## 2013-07-19 MED ORDER — CLINDAMYCIN PHOSPHATE 900 MG/50ML IV SOLN
900.0000 mg | Freq: Once | INTRAVENOUS | Status: AC
  Administered 2013-07-19: 900 mg via INTRAVENOUS
  Filled 2013-07-19: qty 50

## 2013-07-19 MED ORDER — ONDANSETRON HCL 4 MG/2ML IJ SOLN
INTRAMUSCULAR | Status: DC | PRN
  Administered 2013-07-19: 4 mg via INTRAVENOUS

## 2013-07-19 MED ORDER — 0.9 % SODIUM CHLORIDE (POUR BTL) OPTIME
TOPICAL | Status: DC | PRN
  Administered 2013-07-19: 1000 mL

## 2013-07-19 MED ORDER — ONDANSETRON HCL 4 MG/2ML IJ SOLN: 4.0000 mg | Freq: Three times a day (TID) | INTRAMUSCULAR | Status: DC | PRN

## 2013-07-19 MED ORDER — SODIUM CHLORIDE 0.9 % IV SOLN
INTRAVENOUS | Status: DC | PRN
  Administered 2013-07-19: 21:00:00 via INTRAVENOUS

## 2013-07-19 MED ORDER — FENTANYL CITRATE 0.05 MG/ML IJ SOLN
INTRAMUSCULAR | Status: DC | PRN
  Administered 2013-07-19 (×2): 50 ug via INTRAVENOUS
  Administered 2013-07-19 (×2): 25 ug via INTRAVENOUS

## 2013-07-19 MED ORDER — FENTANYL CITRATE 0.05 MG/ML IJ SOLN: 25.0000 ug | INTRAMUSCULAR | Status: DC | PRN

## 2013-07-19 MED ORDER — ARTIFICIAL TEARS OP OINT
TOPICAL_OINTMENT | OPHTHALMIC | Status: DC | PRN
  Administered 2013-07-19: 1 via OPHTHALMIC

## 2013-07-19 MED ORDER — HYDROCODONE-ACETAMINOPHEN 5-325 MG PO TABS: 1.0000 | ORAL_TABLET | ORAL | Status: DC | PRN

## 2013-07-19 MED ORDER — MORPHINE SULFATE 2 MG/ML IJ SOLN: 0.5000 mg | INTRAMUSCULAR | Status: DC | PRN

## 2013-07-19 SURGICAL SUPPLY — 35 items
BANDAGE CONFORM 3  STR LF (GAUZE/BANDAGES/DRESSINGS) ×2 IMPLANT
BIT DRILL CANN LG 4.3MM (BIT) ×1 IMPLANT
CLOTH BEACON ORANGE TIMEOUT ST (SAFETY) ×4 IMPLANT
CLSR STERI-STRIP ANTIMIC 1/2X4 (GAUZE/BANDAGES/DRESSINGS) ×2 IMPLANT
DRAPE STERI IOBAN 125X83 (DRAPES) ×2 IMPLANT
DRILL BIT CANN LG 4.3MM (BIT) ×2
DRSG MEPILEX BORDER 4X4 (GAUZE/BANDAGES/DRESSINGS) ×2 IMPLANT
DRSG MEPILEX BORDER 4X8 (GAUZE/BANDAGES/DRESSINGS) ×2 IMPLANT
DRSG PAD ABDOMINAL 8X10 ST (GAUZE/BANDAGES/DRESSINGS) ×4 IMPLANT
GLOVE BIO SURGEON STRL SZ7.5 (GLOVE) ×2 IMPLANT
GLOVE BIO SURGEON STRL SZ8 (GLOVE) ×2 IMPLANT
GLOVE BIOGEL PI IND STRL 8 (GLOVE) ×2 IMPLANT
GLOVE BIOGEL PI INDICATOR 8 (GLOVE) ×2
GLOVE EUDERMIC 7 POWDERFREE (GLOVE) ×2 IMPLANT
GLOVE SS BIOGEL STRL SZ 7.5 (GLOVE) ×2 IMPLANT
GLOVE SUPERSENSE BIOGEL SZ 7.5 (GLOVE) ×2
GOWN STRL NON-REIN LRG LVL3 (GOWN DISPOSABLE) ×2 IMPLANT
GOWN STRL REIN 3XL LVL4 (GOWN DISPOSABLE) ×2 IMPLANT
GOWN STRL REIN XL XLG (GOWN DISPOSABLE) ×2 IMPLANT
GUIDEWIRE BALL NOSE 100CM (WIRE) ×2 IMPLANT
HIP FR NAIL LAG SCREW 10.5X110 (Orthopedic Implant) ×2 IMPLANT
KIT BASIN OR (CUSTOM PROCEDURE TRAY) ×2 IMPLANT
KIT ROOM TURNOVER OR (KITS) ×2 IMPLANT
NAIL HIP FRACT 130D 11X180 (Screw) ×2 IMPLANT
NS IRRIG 1000ML POUR BTL (IV SOLUTION) ×2 IMPLANT
PACK TOTAL JOINT (CUSTOM PROCEDURE TRAY) ×2 IMPLANT
PAD ARMBOARD 7.5X6 YLW CONV (MISCELLANEOUS) ×2 IMPLANT
SCREW BONE CORTICAL 5.0X40 (Screw) ×2 IMPLANT
SCREW LAG HIP FR NAIL 10.5X110 (Orthopedic Implant) ×1 IMPLANT
SUT MNCRL AB 3-0 PS2 18 (SUTURE) ×2 IMPLANT
SUT VIC AB 0 CT1 27 (SUTURE) ×1
SUT VIC AB 0 CT1 27XBRD ANBCTR (SUTURE) ×1 IMPLANT
SUT VIC AB 2-0 CT1 27 (SUTURE) ×1
SUT VIC AB 2-0 CT1 TAPERPNT 27 (SUTURE) ×1 IMPLANT
TRAY FOLEY CATH 16FR SILVER (SET/KITS/TRAYS/PACK) ×2 IMPLANT

## 2013-07-19 NOTE — Anesthesia Preprocedure Evaluation (Addendum)
Anesthesia Evaluation  Patient identified by MRN, date of birth, ID band Patient awake    Reviewed: Allergy & Precautions, H&P , NPO status , [name] Chart, lab work & pertinent test results  Airway Mallampati: II TM Distance: >3 FB Neck ROM: Full    Dental no notable dental hx. (+) Edentulous Upper and Edentulous Lower   Pulmonary neg pulmonary ROS,  breath sounds clear to auscultation  Pulmonary exam normal       Cardiovascular hypertension, Pt. on medications + CAD and + CABG Rhythm:Regular Rate:Normal     Neuro/Psych negative neurological ROS  negative psych ROS   GI/Hepatic Neg liver ROS, GERD-  Medicated and Controlled,  Endo/Other  negative endocrine ROS  Renal/GU negative Renal ROS  negative genitourinary   Musculoskeletal   Abdominal   Peds  Hematology negative hematology ROS (+)   Anesthesia Other Findings Dentures removed by pt and placed in denture cup with pt's label on lid and container  Reproductive/Obstetrics negative OB ROS                          Anesthesia Physical Anesthesia Plan  ASA: III and emergent  Anesthesia Plan: General   Post-op Pain Management:    Induction: Rapid sequence, Intravenous and Cricoid pressure planned  Airway Management Planned: Oral ETT  Additional Equipment:   Intra-op Plan:   Post-operative Plan: Extubation in OR  Informed Consent: I have reviewed the patients History and Physical, chart, labs and discussed the procedure including the risks, benefits and alternatives for the proposed anesthesia with the patient or authorized representative who has indicated his/her understanding and acceptance.   Dental advisory given  Plan Discussed with: CRNA, Anesthesiologist and Surgeon  Anesthesia Plan Comments:        Anesthesia Quick Evaluation

## 2013-07-19 NOTE — ED Notes (Signed)
Pt presents with left hip pain related to a fall from standing landing on carpet. Pt denies any other injuries

## 2013-07-19 NOTE — ED Provider Notes (Addendum)
CSN: [id number]     Arrival date & time 07/19/13  1549 History   First MD Initiated Contact with Patient 07/19/13 1550     Chief Complaint  Patient presents with  . Fall   (Consider location/radiation/quality/duration/timing/severity/associated sxs/prior Treatment) HPI Comments: Patient presents to the ER for evaluation after a fall. Patient was walking and his legs got "wobbly. He then got up and his walker and fell to the left side. No head injury. Patient denies headache, loss of consciousness. He also denies neck pain, back pain, chest pain and shortness of breath. He is brought to the ER by ambulance. Patient complains of pain in the lateral aspect of the left hip area, but only with movement. He denies any pain sitting on the EMS stretcher.  Patient is a 79 y.o. male presenting with fall.  Fall Pertinent negatives include no chest pain, no headaches and no shortness of breath.    Past Medical History  Diagnosis Date  . Hypercholesteremia   . Anxiety   . GERD (gastroesophageal reflux disease)   . Glaucoma   . Esophageal tear 1990's?  . Hypertension   . Constipation   . Cancer     hx of waldrens disease  . Benign neoplasm of colon 02/2012    polyps x 2 adenomatous w/o high grade dysplasia  . CAD (coronary artery disease) 2007    s/p CABG in [place]  . Diverticulosis of colon (without mention of hemorrhage)     hx LGIB, 04/2012 hosp  . History of resection of small bowel   . Osteoporosis, senile     hx T11 compression fx 02/2012, s/p KP   Past Surgical History  Procedure Laterality Date  . Cardiac surgery    . Appendectomy    . Hernia repair    . Colonoscopy  02/29/2012    Procedure: COLONOSCOPY;  Surgeon: Hart Carwin, MD;  Location: [facility];  Service: Endoscopy;  Laterality: N/A;  . Esophagus surgery      due to esophageal tear  . Small intestine surgery      20 yrs ago; in [place]  . Coronary artery bypass graft      > 6 yrs  . Kyphosis surgery     Family  History  Problem Relation Age of Onset  . Colon cancer Neg Hx   . Malignant hyperthermia Neg Hx    History  Substance Use Topics  . Smoking status: Former Smoker    Quit date: 09/19/1962  . 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 Alcohol Use: No    Review of Systems  HENT: Negative for neck pain.   Respiratory: Negative for shortness of breath.   Cardiovascular: Negative for chest pain.  Musculoskeletal: Negative for back pain.       Left hip pain  Neurological: Negative for dizziness, syncope and headaches.    Allergies  Penicillins  Home Medications   Current Outpatient Rx  Name  Route  Sig  Dispense  Refill  . amLODipine-benazepril (LOTREL) 5-40 MG per capsule   Oral   Take 1 capsule by mouth daily.         Marland Kitchen aspirin EC 81 MG tablet   Oral   Take 1 tablet (81 mg total) by mouth  daily.           Stop aspirin x 1 week, resume 05/16/12   . bisoprolol-hydrochlorothiazide (ZIAC) 5-6.25 MG per tablet   Oral   Take 1 tablet by mouth daily.         . fluticasone (FLONASE) 50 MCG/ACT nasal spray   Nasal   Place 1 spray into the nose daily as needed. allergies         . Multiple Vitamin (MULITIVITAMIN WITH MINERALS) TABS   Oral   Take 1 tablet by mouth daily.         . nitroGLYCERIN (NITROSTAT) 0.4 MG SL tablet   Sublingual   Place 0.4 mg under the tongue every 5 (five) minutes as needed. Chest pain         . OVER THE COUNTER MEDICATION   Oral   Take 1 tablet by mouth daily as needed (Cough).         . OVER THE COUNTER MEDICATION   Topical   Apply 1 application topically at bedtime. Apply ointment to back each evening for pain.         Marland Kitchen senna (SENOKOT) 8.6 MG TABS   Oral   Take 2 tablets by mouth 2 (two) times daily.         .  simvastatin (ZOCOR) 10 MG tablet   Oral   Take 10 mg by mouth daily.         . traMADol (ULTRAM) 50 MG tablet   Oral   Take 1 tablet (50 mg total) by mouth every 8 (eight) hours as needed for pain.   15 tablet   0    There were no vitals taken for this visit. Physical Exam  Constitutional: He is oriented to person, place, and time. He appears well-developed and well-nourished. No distress.  HENT:  Head: Normocephalic and atraumatic.  Right Ear: Hearing normal.  Left Ear: Hearing normal.  Nose: Nose normal.  Mouth/Throat: Oropharynx is clear and moist and mucous membranes are normal.  Eyes: Conjunctivae and EOM are normal. Pupils are equal, round, and reactive to light.  Neck: Normal range of motion. Neck supple.  Cardiovascular: Regular rhythm, S1 normal and S2 normal.  Exam reveals no gallop and no friction rub.   No murmur heard. Pulses:      Dorsalis pedis pulses are 1+ on the left side.  Pulmonary/Chest: Effort normal and breath sounds normal. No respiratory distress. He exhibits no tenderness.  Abdominal: Soft. Normal appearance and bowel sounds are normal. There is no hepatosplenomegaly. There is no tenderness. There is no rebound, no guarding, no tenderness at McBurney's point and negative Murphy's sign. No hernia.  Musculoskeletal:       Left hip: He exhibits decreased range of motion and tenderness. He exhibits no crepitus and no deformity.       Legs: Neurological: He is alert and oriented to person, place, and time. He has normal strength. No cranial nerve deficit or sensory deficit. Coordination normal. GCS eye subscore is 4. GCS verbal subscore is 5. GCS motor subscore is 6.  Skin: Skin is warm, dry and intact. No rash noted. No cyanosis.  Psychiatric: He has a normal mood and affect. His speech is normal and behavior is normal. Thought content normal.    ED Course  Procedures (including critical care time) Labs Review Labs Reviewed  CBC WITH DIFFERENTIAL -  Abnormal; Notable for the following:    RBC 3.78 (*)    Hemoglobin 12.8 (*)  HCT 36.3 (*)    All other components within normal limits  BASIC METABOLIC PANEL - Abnormal; Notable for the following:    Sodium 133 (*)    Potassium 3.4 (*)    Glucose, Bld 125 (*)    GFR calc non Af Amer 73 (*)    GFR calc Af Amer 85 (*)    All other components within normal limits   Imaging Review Dg Chest 1 View  07/19/2013   CLINICAL DATA:  Coronary artery disease. Status post fall.  EXAM: CHEST - 1 VIEW  COMPARISON:  None.  FINDINGS: Previous median sternotomy and CABG procedure. The lung volumes appear slightly diminished and there is atelectasis in the left base. No airspace consolidation identified. No displaced rib fractures identified.  IMPRESSION: 1. Decreased lung volumes and left base atelectasis.  2. Previous CABG.   Electronically Signed   By: Signa Kell M.D.   On: 07/19/2013 17:12   Dg Hip Complete Left  07/19/2013   CLINICAL DATA:  Left hip pain  EXAM: LEFT HIP - COMPLETE 2+ VIEW  COMPARISON:  None  FINDINGS: There is a lucency which extends through the intertrochanteric region of the proximal left femur. This is suspicious for a nondisplaced proximal femur fracture. No dislocation identified. The right hip appears normal.  IMPRESSION: 1. Suspect nondisplaced intertrochanteric fracture of the proximal left femur.   Electronically Signed   By: Signa Kell M.D.   On: 07/19/2013 17:16    MDM  Diagnosis: Left intertrochanteric hip fracture, nondisplaced  Patient brought to the ER for evaluation after a fall. Patient had a ground-level fall, landing on his left side. Patient complaining of left hip pain after the fall. Examination of the reveal any evidence of any other injury. There was no head impact and he is not complaining of headache. Neck and back were nontender. Patient was comfortable her last, but had significant pain with minimal movement of the hip, both active and passive. X-ray  shows nondisplaced intertrochanteric hip fracture. Patient will be admitted for further management.  Addendum: Discussed with Doctor Supple, orthopedics. Will see patient, possible surgery tonight.  Gilda Crease, MD 07/19/13 [id number]  Gilda Crease, MD 07/19/13 1816

## 2013-07-19 NOTE — Progress Notes (Signed)
PENDING ACCEPTANCE TRANFER NOTE:  Call received from:    [place]  REASON FOR Admission:    Left hip fracture  HPI:   79 year old with history of CABG in 2007, no recent acute symptoms, felt safe like a mechanical fall and left hip fracture. Supple called by ED.    SIGNED: Clint Lipps, MD [facility]  07/19/2013, 6:51 PM

## 2013-07-19 NOTE — Transfer of Care (Signed)
Immediate Anesthesia Transfer of Care Note  Patient: Andre Mueller  Procedure(s) Performed: Procedure(s): OPEN REDUCTION INTERNAL FIXATION HIP (Left)  Patient Location: PACU  Anesthesia Type:General  Level of Consciousness: responds to stimulation  Airway & Oxygen Therapy: Patient Spontanous Breathing and Patient connected to nasal cannula oxygen  Post-op Assessment: Report given to PACU RN and Post -op Vital signs reviewed and stable  Post vital signs: Reviewed and stable  Complications: No apparent anesthesia complications

## 2013-07-19 NOTE — Consult Note (Signed)
Reason for Consult:left hip pain after fall Referring Physician: EDP  HPI: Andre Mueller is an 79 y.o. male resident of assisted living facility s/p mechanical fall with c/o left hip pain  Past Medical History  Diagnosis Date  . Hypercholesteremia   . Anxiety   . GERD (gastroesophageal reflux disease)   . Glaucoma   . Esophageal tear 1990's?  . Hypertension   . Constipation   . Cancer     hx of waldrens disease  . Benign neoplasm of colon 02/2012    polyps x 2 adenomatous w/o high grade dysplasia  . CAD (coronary artery disease) 2007    s/p CABG in [place]  . Diverticulosis of colon (without mention of hemorrhage)     hx LGIB, 04/2012 hosp  . History of resection of small bowel   . Osteoporosis, senile     hx T11 compression fx 02/2012, s/p KP    Past Surgical History  Procedure Laterality Date  . Cardiac surgery    . Appendectomy    . Hernia repair    . Colonoscopy  02/29/2012    Procedure: COLONOSCOPY;  Surgeon: Hart Carwin, MD;  Location: [facility];  Service: Endoscopy;  Laterality: N/A;  . Esophagus surgery      due to esophageal tear  . Small intestine surgery      20 yrs ago; in [place]  . Coronary artery bypass graft      > 6 yrs  . Kyphosis surgery      Family History  Problem Relation Age of Onset  . Colon cancer Neg Hx   . Malignant hyperthermia Neg Hx     Social History:  reports that he quit smoking about 50 years ago. He has never used smokeless tobacco. He reports that he does not drink alcohol or use illicit drugs.  Allergies:  Allergies  Allergen Reactions  . Penicillins Swelling    Medications: list pending  Results for orders placed during the hospital encounter of 07/19/13 (from the past 48 hour(s))  CBC WITH DIFFERENTIAL     Status: Abnormal   Collection Time    07/19/13  4:43 PM      Result Value Range   WBC 9.6  4.0 - 10.5 K/uL   RBC 3.78 (*) 4.22 - 5.81 MIL/uL   Hemoglobin 12.8 (*) 13.0 - 17.0 g/dL   HCT 16.1 (*) 09.6 - 04.5 %    MCV 96.0  78.0 - 100.0 fL   MCH 33.9  26.0 - 34.0 pg   MCHC 35.3  30.0 - 36.0 g/dL   RDW 40.9  81.1 - 91.4 %   Platelets 155  150 - 400 K/uL   Neutrophils Relative % 77  43 - 77 %   Neutro Abs 7.4  1.7 - 7.7 K/uL   Lymphocytes Relative 14  12 - 46 %   Lymphs Abs 1.4  0.7 - 4.0 K/uL   Monocytes Relative 8  3 - 12 %   Monocytes Absolute 0.8  0.1 - 1.0 K/uL   Eosinophils Relative 1  0 - 5 %   Eosinophils Absolute 0.1  0.0 - 0.7 K/uL   Basophils Relative 0  0 - 1 %   Basophils Absolute 0.0  0.0 - 0.1 K/uL  BASIC METABOLIC PANEL     Status: Abnormal   Collection Time    07/19/13  4:43 PM      Result Value Range   Sodium 133 (*) 135 - 145 mEq/L   Potassium  3.4 (*) 3.5 - 5.1 mEq/L   Chloride 97  96 - 112 mEq/L   CO2 26  19 - 32 mEq/L   Glucose, Bld 125 (*) 70 - 99 mg/dL   BUN 23  6 - 23 mg/dL   Creatinine, Ser 4.09  0.50 - 1.35 mg/dL   Calcium 8.9  8.4 - 81.1 mg/dL   GFR calc non Af Amer 73 (*) >90 mL/min   GFR calc Af Amer 85 (*) >90 mL/min   Comment: (NOTE)     The eGFR has been calculated using the CKD EPI equation.     This calculation has not been validated in all clinical situations.     eGFR's persistently <90 mL/min signify possible Chronic Kidney     Disease.  PROTIME-INR     Status: Abnormal   Collection Time    07/19/13  6:24 PM      Result Value Range   Prothrombin Time 15.3 (*) 11.6 - 15.2 seconds   INR 1.24  0.00 - 1.49    Dg Chest 1 View  07/19/2013   CLINICAL DATA:  Coronary artery disease. Status post fall.  EXAM: CHEST - 1 VIEW  COMPARISON:  None.  FINDINGS: Previous median sternotomy and CABG procedure. The lung volumes appear slightly diminished and there is atelectasis in the left base. No airspace consolidation identified. No displaced rib fractures identified.  IMPRESSION: 1. Decreased lung volumes and left base atelectasis.  2. Previous CABG.   Electronically Signed   By: Signa Kell M.D.   On: 07/19/2013 17:12   Dg Hip Complete Left  07/19/2013    CLINICAL DATA:  Left hip pain  EXAM: LEFT HIP - COMPLETE 2+ VIEW  COMPARISON:  None  FINDINGS: There is a lucency which extends through the intertrochanteric region of the proximal left femur. This is suspicious for a nondisplaced proximal femur fracture. No dislocation identified. The right hip appears normal.  IMPRESSION: 1. Suspect nondisplaced intertrochanteric fracture of the proximal left femur.   Electronically Signed   By: Signa Kell M.D.   On: 07/19/2013 17:16     Vitals Temp:  [98.2 F (36.8 C)] 98.2 F (36.8 C) (09/21 1556) Pulse Rate:  [73] 73 (09/21 1556) Resp:  [16-22] 22 (09/21 1852) BP: (131-163)/(62-75) 163/75 mmHg (09/21 1852) SpO2:  [95 %-97 %] 97 % (09/21 1852) Weight:  [72.576 kg (160 lb)] 72.576 kg (160 lb) (09/21 1556) Body mass index is 23.62 kg/(m^2).  Physical Exam: thin WM, [place]/AT, cooperative with exam, oriented. No clavicular tenderness or crepitance, no gross bone or joint instability in UE's, abd soft, pelvis stable. Left hip diffusely tender, RLE no gross bone or joint instability, N/V intact distally BLE       Assessment/Plan: Impression: Nondisplaced left IT hip fracture Treatment: ORIF with [facility]. Treatment options and risks versus benefits discussed with patient. He understands and accepts and agrees with plan. To OR this evening. Discussed plan with TRH and wife.  Loralee Weitzman M 07/19/2013, 7:12 PM

## 2013-07-19 NOTE — Anesthesia Procedure Notes (Signed)
Procedure Name: Intubation Date/Time: 07/19/2013 9:24 PM Performed by: Luster Landsberg Pre-anesthesia Checklist: Patient identified, Emergency Drugs available, Suction available and Patient being monitored Patient Re-evaluated:Patient Re-evaluated prior to inductionOxygen Delivery Method: Circle system utilized Preoxygenation: Pre-oxygenation with 100% oxygen Intubation Type: IV induction, Cricoid Pressure applied and Rapid sequence Laryngoscope Size: Mac and 4 Grade View: Grade I Tube type: Oral Tube size: 7.5 mm Number of attempts: 1 Airway Equipment and Method: Stylet Placement Confirmation: ETT inserted through vocal cords under direct vision,  positive ETCO2 and breath sounds checked- equal and bilateral Secured at: 21 cm Tube secured with: Tape Dental Injury: Teeth and Oropharynx as per pre-operative assessment

## 2013-07-19 NOTE — H&P (Signed)
[facility] ADMISSION H&P  Chief Complaint: Fall   HPI: 79 yr old WM w/ pmhx significant for CAD s/p CABG, HL, HTN, glaucoma, osteoporosis presents due to fall. The patient states he was getting up by using his walker and then felt he took a "wrong turn".  He states the walker was no cooperating with him. He states he then fell to his left side and landed on his left side. He states he did not hit his head. He denies any SOB, CP, dizziness prior to the fall.  He has some pain at the left hip area.  Case was discussed with Dr. Rennis Chris of orthopedics at bedside. EKG reveals no acute ST changes. He was found to have evidence of a L nondisplaced intertrochanteric fracture of proximal left femur.  Past Medical History  Diagnosis Date  . Hypercholesteremia   . Anxiety   . GERD (gastroesophageal reflux disease)   . Glaucoma   . Esophageal tear 1990's?  . Hypertension   . Constipation   . Cancer     hx of waldrens disease  . Benign neoplasm of colon 02/2012    polyps x 2 adenomatous w/o high grade dysplasia  . CAD (coronary artery disease) 2007    s/p CABG in [place]  . Diverticulosis of colon (without mention of hemorrhage)     hx LGIB, 04/2012 hosp  . History of resection of small bowel   . Osteoporosis, senile     hx T11 compression fx 02/2012, s/p KP    Past Surgical History  Procedure Laterality Date  . Cardiac surgery    . Appendectomy    . Hernia repair    . Colonoscopy  02/29/2012    Procedure: COLONOSCOPY;  Surgeon: Hart Carwin, MD;  Location: [facility];  Service: Endoscopy;  Laterality: N/A;  . Esophagus surgery      due to esophageal tear  . Small intestine surgery      20 yrs ago; in [place]  . Coronary artery bypass graft      > 6 yrs  . Kyphosis surgery      Family History  Problem Relation Age of Onset  . Colon cancer Neg Hx   . Malignant hyperthermia Neg Hx    Social History:  reports that he quit smoking about 50 years ago. He has never used smokeless  tobacco. He reports that he does not drink alcohol or use illicit drugs.  Allergies:  Allergies  Allergen Reactions  . Penicillins Swelling     (Not in a hospital admission)  Results for orders placed during the hospital encounter of 07/19/13 (from the past 48 hour(s))  CBC WITH DIFFERENTIAL     Status: Abnormal   Collection Time    07/19/13  4:43 PM      Result Value Range   WBC 9.6  4.0 - 10.5 K/uL   RBC 3.78 (*) 4.22 - 5.81 MIL/uL   Hemoglobin 12.8 (*) 13.0 - 17.0 g/dL   HCT 16.1 (*) 09.6 - 04.5 %   MCV 96.0  78.0 - 100.0 fL   MCH 33.9  26.0 - 34.0 pg   MCHC 35.3  30.0 - 36.0 g/dL   RDW 40.9  81.1 - 91.4 %   Platelets 155  150 - 400 K/uL   Neutrophils Relative % 77  43 - 77 %   Neutro Abs 7.4  1.7 - 7.7 K/uL   Lymphocytes Relative 14  12 - 46 %   Lymphs Abs 1.4  0.7 - 4.0 K/uL   Monocytes Relative 8  3 - 12 %   Monocytes Absolute 0.8  0.1 - 1.0 K/uL   Eosinophils Relative 1  0 - 5 %   Eosinophils Absolute 0.1  0.0 - 0.7 K/uL   Basophils Relative 0  0 - 1 %   Basophils Absolute 0.0  0.0 - 0.1 K/uL  BASIC METABOLIC PANEL     Status: Abnormal   Collection Time    07/19/13  4:43 PM      Result Value Range   Sodium 133 (*) 135 - 145 mEq/L   Potassium 3.4 (*) 3.5 - 5.1 mEq/L   Chloride 97  96 - 112 mEq/L   CO2 26  19 - 32 mEq/L   Glucose, Bld 125 (*) 70 - 99 mg/dL   BUN 23  6 - 23 mg/dL   Creatinine, Ser 1.61  0.50 - 1.35 mg/dL   Calcium 8.9  8.4 - 09.6 mg/dL   GFR calc non Af Amer 73 (*) >90 mL/min   GFR calc Af Amer 85 (*) >90 mL/min   Comment: (NOTE)     The eGFR has been calculated using the CKD EPI equation.     This calculation has not been validated in all clinical situations.     eGFR's persistently <90 mL/min signify possible Chronic Kidney     Disease.  PROTIME-INR     Status: Abnormal   Collection Time    07/19/13  6:24 PM      Result Value Range   Prothrombin Time 15.3 (*) 11.6 - 15.2 seconds   INR 1.24  0.00 - 1.49   Dg Chest 1 View  07/19/2013    CLINICAL DATA:  Coronary artery disease. Status post fall.  EXAM: CHEST - 1 VIEW  COMPARISON:  None.  FINDINGS: Previous median sternotomy and CABG procedure. The lung volumes appear slightly diminished and there is atelectasis in the left base. No airspace consolidation identified. No displaced rib fractures identified.  IMPRESSION: 1. Decreased lung volumes and left base atelectasis.  2. Previous CABG.   Electronically Signed   By: Signa Kell M.D.   On: 07/19/2013 17:12   Dg Hip Complete Left  07/19/2013   CLINICAL DATA:  Left hip pain  EXAM: LEFT HIP - COMPLETE 2+ VIEW  COMPARISON:  None  FINDINGS: There is a lucency which extends through the intertrochanteric region of the proximal left femur. This is suspicious for a nondisplaced proximal femur fracture. No dislocation identified. The right hip appears normal.  IMPRESSION: 1. Suspect nondisplaced intertrochanteric fracture of the proximal left femur.   Electronically Signed   By: Signa Kell M.D.   On: 07/19/2013 17:16    Review of Systems  Constitutional: Negative for fever, chills, weight loss and malaise/fatigue.  Eyes: Negative for blurred vision.  Respiratory: Negative for cough, sputum production, shortness of breath and wheezing.   Cardiovascular: Negative for chest pain, palpitations, orthopnea and PND.  Gastrointestinal: Negative for nausea, vomiting, diarrhea, constipation, blood in stool and melena.  Musculoskeletal: Positive for falls.  Neurological: Negative for dizziness and headaches.    Blood pressure 163/75, pulse 73, temperature 98.2 F (36.8 C), temperature source Oral, resp. rate 22, height 5\' 9"  (1.753 m), weight 160 lb (72.576 kg), SpO2 97.00%. Physical Exam  Constitutional: He is oriented to person, place, and time. He appears well-developed and well-nourished. No distress.  HENT:  Head: Normocephalic and atraumatic.  Eyes: Conjunctivae and EOM are normal. Pupils are equal,  round, and reactive to light.   Neck: Normal range of motion. Neck supple. No JVD present. No thyromegaly present.  Cardiovascular: Normal rate, regular rhythm, normal heart sounds and intact distal pulses.  Exam reveals no gallop and no friction rub.   No murmur heard. Respiratory: Effort normal and breath sounds normal. No respiratory distress. He has no wheezes.  GI: Soft. Bowel sounds are normal. He exhibits no distension. There is no tenderness. There is no rebound and no guarding.  Musculoskeletal: He exhibits tenderness.       Left hip: He exhibits tenderness and bony tenderness.  Neurological: He is alert and oriented to person, place, and time. No cranial nerve deficit. Coordination normal.  Skin: Skin is dry. He is not diaphoretic.  Psychiatric: He has a normal mood and affect.     Assessment/Plan 79 yr old WM w/ pmhx significant for CAD s/p CABG, HL, HTN, glaucoma, osteoporosis presents due to fall, with left hip fracture. 1) Left proximal femur intertrochanteric fracture: I see no absolute contraindications to surgery.  Low risk procedure in this patient. Fall is mechanical and there is low suspicion for neurological or cardiac causes. 2) HTN: continue home meds. 3) CAD s/p CABG: Stable. Denies any CP at home with exertion. Denies SOB.  Holding ASA for now. Resume post surgery if ok with orthopedics. 4) FEN: IVF, will replace K slowly with IVF. NPO for now. 5) Proph: SCDs. 6) Code: FULL.  Jonah Blue, DO, FACP 07/19/2013, 7:31 PM

## 2013-07-19 NOTE — ED Notes (Signed)
Wife Hamad Whyte can be reached at [phone]. Son  Mariah can be reached at [phone]

## 2013-07-19 NOTE — Anesthesia Postprocedure Evaluation (Signed)
  Anesthesia Post-op Note  Patient: Andre Mueller  Procedure(s) Performed: Procedure(s): OPEN REDUCTION INTERNAL FIXATION HIP (Left)  Patient Location: PACU  Anesthesia Type:General  Level of Consciousness: awake  Airway and Oxygen Therapy: Patient Spontanous Breathing and Patient connected to nasal cannula oxygen  Post-op Pain: none  Post-op Assessment: Post-op Vital signs reviewed, [name] Cardiovascular Status Stable, Respiratory Function Stable and Patent Airway  Post-op Vital Signs: Reviewed and stable  Complications: No apparent anesthesia complications

## 2013-07-19 NOTE — Op Note (Signed)
07/19/2013  10:16 PM  PATIENT:   Andre Mueller  79 y.o. male  PRE-OPERATIVE DIAGNOSIS:  Non displaced left inter troch hip fracture  POST-OPERATIVE DIAGNOSIS:  same  PROCEDURE:  ORIF with IMHS  SURGEON:  Muriel Wilber, Vania Rea M.D.  ASSISTANTS: Shuford pac   ANESTHESIA:   GET  EBL: <50cc  SPECIMEN:  none  Drains: none   PATIENT DISPOSITION:  PACU - hemodynamically stable.    PLAN OF CARE: Admit to inpatient   Dictation# [id number] [id number]

## 2013-07-20 DIAGNOSIS — D126 Benign neoplasm of colon, unspecified: Secondary | ICD-10-CM

## 2013-07-20 DIAGNOSIS — K5900 Constipation, unspecified: Secondary | ICD-10-CM

## 2013-07-20 LAB — CBC
MCHC: 35 g/dL (ref 30.0–36.0)
MCV: 95.7 fL (ref 78.0–100.0)
Platelets: 143 10*3/uL — ABNORMAL LOW (ref 150–400)
RBC: 3.23 MIL/uL — ABNORMAL LOW (ref 4.22–5.81)
RDW: 14.3 % (ref 11.5–15.5)
WBC: 7.6 10*3/uL (ref 4.0–10.5)

## 2013-07-20 LAB — COMPREHENSIVE METABOLIC PANEL
Albumin: 2.9 g/dL — ABNORMAL LOW (ref 3.5–5.2)
Alkaline Phosphatase: 54 U/L (ref 39–117)
BUN: 20 mg/dL (ref 6–23)
CO2: 24 mEq/L (ref 19–32)
Chloride: 98 mEq/L (ref 96–112)
Creatinine, Ser: 0.71 mg/dL (ref 0.50–1.35)
GFR calc non Af Amer: 81 mL/min — ABNORMAL LOW (ref >90)
Glucose, Bld: 157 mg/dL — ABNORMAL HIGH (ref 70–99)
Potassium: 3.7 mEq/L (ref 3.5–5.1)
Sodium: 132 mEq/L — ABNORMAL LOW (ref 135–145)
Total Bilirubin: 1.1 mg/dL (ref 0.3–1.2)

## 2013-07-20 LAB — URINE MICROSCOPIC-ADD ON

## 2013-07-20 LAB — URINALYSIS, ROUTINE W REFLEX MICROSCOPIC
Glucose, UA: NEGATIVE mg/dL
Ketones, ur: NEGATIVE mg/dL
Protein, ur: 30 mg/dL — AB
Urobilinogen, UA: 0.2 mg/dL (ref 0.0–1.0)

## 2013-07-20 LAB — PROTIME-INR: Prothrombin Time: 15.4 seconds — ABNORMAL HIGH (ref 11.6–15.2)

## 2013-07-20 MED ORDER — BISACODYL 5 MG PO TBEC
5.0000 mg | DELAYED_RELEASE_TABLET | Freq: Every day | ORAL | Status: DC | PRN
  Administered 2013-07-21 – 2013-07-22 (×2): 5 mg via ORAL
  Filled 2013-07-20 (×2): qty 1

## 2013-07-20 MED ORDER — POLYETHYLENE GLYCOL 3350 17 G PO PACK
17.0000 g | PACK | Freq: Every day | ORAL | Status: DC | PRN
  Administered 2013-07-21: 17 g via ORAL
  Filled 2013-07-20: qty 1

## 2013-07-20 MED ORDER — ALUM & MAG HYDROXIDE-SIMETH 200-200-20 MG/5ML PO SUSP: 30.0000 mL | ORAL | Status: DC | PRN

## 2013-07-20 MED ORDER — METOCLOPRAMIDE HCL 5 MG/ML IJ SOLN: 5.0000 mg | Freq: Three times a day (TID) | INTRAMUSCULAR | Status: DC | PRN

## 2013-07-20 MED ORDER — KCL IN DEXTROSE-NACL 20-5-0.45 MEQ/L-%-% IV SOLN
INTRAVENOUS | Status: AC
  Administered 2013-07-20: 03:00:00 via INTRAVENOUS
  Filled 2013-07-20 (×2): qty 1000

## 2013-07-20 MED ORDER — SODIUM CHLORIDE 0.9 % IV SOLN
INTRAVENOUS | Status: DC
  Administered 2013-07-20: 19:00:00 via INTRAVENOUS

## 2013-07-20 MED ORDER — SODIUM CHLORIDE 0.9 % IJ SOLN
3.0000 mL | Freq: Two times a day (BID) | INTRAMUSCULAR | Status: DC
  Administered 2013-07-20: 3 mL via INTRAVENOUS

## 2013-07-20 MED ORDER — CLINDAMYCIN PHOSPHATE 600 MG/50ML IV SOLN
600.0000 mg | Freq: Four times a day (QID) | INTRAVENOUS | Status: AC
  Administered 2013-07-20 (×2): 600 mg via INTRAVENOUS
  Filled 2013-07-20 (×3): qty 50

## 2013-07-20 MED ORDER — ACETAMINOPHEN 650 MG RE SUPP: 650.0000 mg | Freq: Four times a day (QID) | RECTAL | Status: DC | PRN

## 2013-07-20 MED ORDER — NITROGLYCERIN 0.4 MG SL SUBL: 0.4000 mg | SUBLINGUAL_TABLET | SUBLINGUAL | Status: DC | PRN

## 2013-07-20 MED ORDER — MORPHINE SULFATE 2 MG/ML IJ SOLN: 0.5000 mg | INTRAMUSCULAR | Status: DC | PRN

## 2013-07-20 MED ORDER — BENAZEPRIL HCL 40 MG PO TABS
40.0000 mg | ORAL_TABLET | Freq: Every day | ORAL | Status: DC
  Administered 2013-07-20: 40 mg via ORAL
  Filled 2013-07-20 (×2): qty 1

## 2013-07-20 MED ORDER — HYDROCODONE-ACETAMINOPHEN 5-325 MG PO TABS
1.0000 | ORAL_TABLET | Freq: Four times a day (QID) | ORAL | Status: DC | PRN
  Administered 2013-07-20: 2 via ORAL
  Administered 2013-07-21 – 2013-07-23 (×4): 1 via ORAL
  Filled 2013-07-20 (×3): qty 1
  Filled 2013-07-20: qty 2
  Filled 2013-07-20: qty 1

## 2013-07-20 MED ORDER — ENOXAPARIN SODIUM 40 MG/0.4ML ~~LOC~~ SOLN
40.0000 mg | SUBCUTANEOUS | Status: DC
  Administered 2013-07-20 – 2013-07-23 (×4): 40 mg via SUBCUTANEOUS
  Filled 2013-07-20 (×4): qty 0.4

## 2013-07-20 MED ORDER — SIMVASTATIN 10 MG PO TABS
10.0000 mg | ORAL_TABLET | Freq: Every day | ORAL | Status: DC
  Administered 2013-07-20 – 2013-07-22 (×3): 10 mg via ORAL
  Filled 2013-07-20 (×4): qty 1

## 2013-07-20 MED ORDER — METOCLOPRAMIDE HCL 10 MG PO TABS: 5.0000 mg | ORAL_TABLET | Freq: Three times a day (TID) | ORAL | Status: DC | PRN

## 2013-07-20 MED ORDER — SODIUM CHLORIDE 0.9 % IV BOLUS (SEPSIS)
500.0000 mL | Freq: Once | INTRAVENOUS | Status: AC
  Administered 2013-07-20: 500 mL via INTRAVENOUS

## 2013-07-20 MED ORDER — PHENOL 1.4 % MT LIQD: 1.0000 | OROMUCOSAL | Status: DC | PRN

## 2013-07-20 MED ORDER — FLEET ENEMA 7-19 GM/118ML RE ENEM: 1.0000 | ENEMA | Freq: Once | RECTAL | Status: AC | PRN

## 2013-07-20 MED ORDER — ACETAMINOPHEN 325 MG PO TABS
650.0000 mg | ORAL_TABLET | Freq: Four times a day (QID) | ORAL | Status: DC | PRN
  Filled 2013-07-20: qty 2

## 2013-07-20 MED ORDER — BISOPROLOL-HYDROCHLOROTHIAZIDE 5-6.25 MG PO TABS
1.0000 | ORAL_TABLET | Freq: Every day | ORAL | Status: DC
  Administered 2013-07-20: 1 via ORAL
  Filled 2013-07-20 (×2): qty 1

## 2013-07-20 MED ORDER — AMLODIPINE BESYLATE 5 MG PO TABS
5.0000 mg | ORAL_TABLET | Freq: Every day | ORAL | Status: DC
  Administered 2013-07-20: 5 mg via ORAL
  Filled 2013-07-20 (×2): qty 1

## 2013-07-20 MED ORDER — ONDANSETRON HCL 4 MG/2ML IJ SOLN: 4.0000 mg | Freq: Four times a day (QID) | INTRAMUSCULAR | Status: DC | PRN

## 2013-07-20 MED ORDER — MENTHOL 3 MG MT LOZG: 1.0000 | LOZENGE | OROMUCOSAL | Status: DC | PRN

## 2013-07-20 MED ORDER — AMLODIPINE BESY-BENAZEPRIL HCL 5-40 MG PO CAPS: 1.0000 | ORAL_CAPSULE | Freq: Every day | ORAL | Status: DC

## 2013-07-20 MED ORDER — DOCUSATE SODIUM 100 MG PO CAPS
100.0000 mg | ORAL_CAPSULE | Freq: Two times a day (BID) | ORAL | Status: DC
  Administered 2013-07-20 – 2013-07-22 (×5): 100 mg via ORAL
  Filled 2013-07-20 (×6): qty 1

## 2013-07-20 MED ORDER — ONDANSETRON HCL 4 MG PO TABS
4.0000 mg | ORAL_TABLET | Freq: Four times a day (QID) | ORAL | Status: DC | PRN
  Administered 2013-07-22 – 2013-07-23 (×2): 4 mg via ORAL
  Filled 2013-07-20 (×3): qty 1

## 2013-07-20 NOTE — Progress Notes (Signed)
UR COMPLETED  

## 2013-07-20 NOTE — Evaluation (Signed)
Physical Therapy Evaluation Patient Details Name: Andre Mueller MRN: [id number] DOB: [birth year] Today's Date: 07/20/2013 Time: 7829-5621 PT Time Calculation (min): 44 min  PT Assessment / Plan / Recommendation History of Present Illness  s/p ORIF for hip fx LLE; WBAT  Clinical Impression  Patient is s/p above surgery resulting in functional limitations due to the deficits listed below (see PT Problem List).  Patient will benefit from skilled PT to increase their independence and safety with mobility to allow discharge to the venue listed below.       PT Assessment  Patient needs continued PT services    Follow Up Recommendations  CIR    Does the patient have the potential to tolerate intense rehabilitation      Barriers to Discharge Decreased caregiver support It seems wife can give limited assist    Equipment Recommendations  Rolling walker with 5" wheels;3in1 (PT)    Recommendations for Other Services OT consult;Rehab consult   Frequency Min 6X/week    Precautions / Restrictions Precautions Precautions: Fall Restrictions LLE Weight Bearing: Weight bearing as tolerated   Pertinent Vitals/Pain Increased pain with moving, pt did not rate, but had not had pain meds for the greater part of the day prior to session RN provided medication to assist with pain control       Mobility  Bed Mobility Bed Mobility: Supine to Sit;Sitting - Scoot to Edge of Bed Supine to Sit: 2: Max assist Sitting - Scoot to [place] of Bed: 3: Mod assist;With rail Details for Bed Mobility Assistance: Step-by-step cues for technique; physical assist to elevate trunk form bed Transfers Transfers: Sit to Stand;Stand to Sit Sit to Stand: 1: +2 Total assist Sit to Stand: Patient Percentage: 60% Stand to Sit: 3: Mod assist;To chair/3-in-1;With armrests Details for Transfer Assistance: Cues for technique, hand placement and prepositioning LLE for comfort; physical assist to control  descent Ambulation/Gait Ambulation/Gait Assistance: 1: +2 Total assist Ambulation/Gait: Patient Percentage: 60% Ambulation Distance (Feet): 2 Feet (pivot steps to chair after standing about 5 minutes for dsg ) Assistive device: Rolling walker Ambulation/Gait Assistance Details: Cues for gait sequence and to unweigh painful LLE by pushing down into RW Gait Pattern: Step-to pattern    Exercises General Exercises - Lower Extremity Quad Sets: AROM;Both;5 reps Heel Slides: AAROM;Left;5 reps Hip ABduction/ADduction: Left;AAROM;5 reps   PT Diagnosis: Difficulty walking;Acute pain  PT Problem List: Decreased strength;Decreased range of motion;Decreased activity tolerance;Decreased balance;Decreased mobility;Decreased knowledge of use of DME;Decreased knowledge of precautions;Pain PT Treatment Interventions: DME instruction;Gait training;Functional mobility training;Therapeutic activities;Therapeutic exercise;Balance training;Patient/family education     PT Goals(Current goals can be found in the care plan section) Acute Rehab PT Goals Patient Stated Goal: get better; jokingly spoke of a tennis match he has planned for 4pm today PT Goal Formulation: With patient Time For Goal Achievement: 08/03/13 Potential to Achieve Goals: Good  Visit Information  Last PT Received On: 07/20/13 Assistance Needed: +1 History of Present Illness: s/p ORIF for hip fx LLE; WBAT       Prior Functioning  Home Living Family/patient expects to be discharged to:: Inpatient rehab Living Arrangements: Spouse/significant other Available Help at Discharge: Family;Other (Comment) (staff at ALF) Type of Home: Other(Comment) (ALF) Home Equipment: Walker - 2 wheels Additional Comments: Wife says she feels like pt forgets RW a lot Prior Function Level of Independence: Needs assistance Gait / Transfers Assistance Needed: RW; walks to dining hall Communication Communication: No difficulties    Cognition   Cognition Arousal/Alertness: Awake/alert Behavior During Therapy: [facility]  for tasks assessed/performed Overall Cognitive Status: Within Functional Limits for tasks assessed    Extremity/Trunk Assessment Upper Extremity Assessment Upper Extremity Assessment: Overall WFL for tasks assessed Lower Extremity Assessment Lower Extremity Assessment: LLE deficits/detail LLE Deficits / Details: Decr AROM and strength, limited by pain postop LLE: Unable to fully assess due to pain   Balance Balance Balance Assessed: Yes Static Standing Balance Static Standing - Balance Support: Bilateral upper extremity supported (on RW) Static Standing - Level of Assistance: 5: Stand by assistance Static Standing - Comment/# of Minutes: Stood at [employer] at least 5 minutes while his dressing was changed  End of Session PT - End of Session Equipment Utilized During Treatment: Gait belt Activity Tolerance: Patient tolerated treatment well Patient left: in chair;with call bell/phone within reach Nurse Communication: Mobility status  GP     Andre Mueller, Andre Mueller [phone]  07/20/2013, 1:46 PM

## 2013-07-20 NOTE — Progress Notes (Signed)
Pt remains hypotensive (see flowsheet) but alert and oriented x4. Dr. Rhona Leavens notified. IV fluids increased to NS @ 151ml/hr. All other VS stable. Will continue to monitor.

## 2013-07-20 NOTE — Op Note (Signed)
NAMELUCAH, PETTA NO.:  [id number]  MEDICAL RECORD NO.:  [id number]  LOCATION:  [id number]                        FACILITY:  [facility]  PHYSICIAN:  Vania Rea. Breda Bond, M.D.  DATE OF BIRTH:  [birth year]  DATE OF PROCEDURE:  07/19/2013 DATE OF DISCHARGE:                              OPERATIVE REPORT   PREOPERATIVE DIAGNOSIS:  Nondisplaced left intertrochanteric hip fracture.  POSTOPERATIVE DIAGNOSIS:  Nondisplaced left intertrochanteric hip fracture.  PROCEDURE:  Open reduction and internal fixation with intramedullary hip screw, 11 mm diameter short nail, 130 degrees with a 110 mm lag screw and a 40 mm static lock distally.  SURGEON:  Vania Rea. Jermel Artley, M.D.  Threasa HeadsFrench Ana A. Shuford, PA-C  ANESTHESIA:  General endotracheal.  BLOOD LOSS:  Less than 50 mL.  DRAINS:  None.  HISTORY:  Mr. Marlette is an 79 year old gentleman, resident of an assisted living facility, fell earlier today landing on the left hip with immediate complaints of pain and inability to bear weight.  He is brought to the [facility] Emergency Room, where on evaluation was found to have generalized tenderness about the left hip girdle and Orthopedics __________ left hip.  His radiographs show a nondisplaced left intertrochanteric hip fracture.  He was brought to the operating room at this time for planned ORIF.  I preoperatively counseled Mr. Ledwell on treatment options as well as risks versus benefits thereof.  Possible surgical complications were reviewed including the potential for bleeding, infection, neurovascular injury, malunion, nonunion, loss of fixation, and possible need for additional surgery.  He understands and accepts and agrees with our planned procedure.  PROCEDURE IN DETAIL:  After undergoing routine preop evaluation, the patient received prophylactic antibiotics, brought to the operating room, and placed supine on the operating table, underwent smooth induction of a general  endotracheal anesthesia.  Transferred to the fracture table in supine position where his Foley catheter was placed. The left leg was placed into extension.  The right leg was placed in well leg holder and appropriately padded and protected.  We obtained initial fluoroscopic images which showed continued good alignment at the left intertrochanteric region.  At this point, the left hip girdle was then sterilely prepped and draped in standard fashion.  Time-out was called.  A 3-cm longitudinal incision was made just proximal to the tip of the greater trochanter.  Dissection carried down through the skin, subcu, and deep fascia.  Allowed [place] to gain access to the tip of the trochanter and a starting awl was then used to gain access into the tip of the trochanter with proper positioning confirmed on AP and lateral fluoroscopic images.  We then placed a ball-tipped guidewire through our starting awl and passed this down the femoral shaft.  A starter reamer was then passed over the guidewire and when we seated our 11 mm short intramedullary nail to the appropriate depth.  Guidewires were removed, then through the outrigger guide, we passed a threaded-tipped guide pin up into the femoral neck and head with proper positioning confirmed on fluoroscopic images.  We then drilled and placed our 110 mm lag screw, appropriate depth good bony purchase and fixation.  We then placed  this into the dynamic locking mode with a proximal locking screw.  Then using the outrigger guide, we placed our distal static locking screw, 40 mm length with good purchase.  The outrigger guide was then removed.  Final fluoroscopic images and AP and lateral views showed his implant to be in good position, good alignment at the fracture site.  Wounds were all then irrigated.  A 0 Vicryl was used to close the deep fascial layers, 2- 0 Vicryl to the subcu and intracuticular 3-0 Monocryl for the skin, followed by Steri-Strips.  Dry  dressing was applied, and the patient was then placed supine, removed from traction, awakened, extubated, and taken to recovery care room in stable condition.     Vania Rea. Aubrianne Molyneux, M.D.     KMS/MEDQ  D:  07/19/2013  T:  07/20/2013  Job:  [id number]

## 2013-07-20 NOTE — Progress Notes (Signed)
Andre Mueller  MRN: [id number] DOB/Age: 77-Mar-1922 80 y.o. Physician: Jacquelyne Balint Procedure: Procedure(s) (LRB): OPEN REDUCTION INTERNAL FIXATION HIP (Left)     Subjective:  Ate well for breakfast according to wife. He is not complaining of any pain except his heels  Vital Signs Temp:  [97.3 F (36.3 C)-98.3 F (36.8 C)] 97.3 F (36.3 C) (09/22 0557) Pulse Rate:  [73-80] 76 (09/22 0557) Resp:  [15-26] 18 (09/22 0557) BP: (118-166)/(52-80) 118/52 mmHg (09/22 0557) SpO2:  [94 %-99 %] 98 % (09/22 0557) Weight:  [72.576 kg (160 lb)-73.5 kg (162 lb 0.6 oz)] 73.5 kg (162 lb 0.6 oz) (09/21 2345)  Lab Results  Recent Labs  07/19/13 1643 07/20/13 0635  WBC 9.6 7.6  HGB 12.8* 10.8*  HCT 36.3* 30.9*  PLT 155 143*   BMET  Recent Labs  07/19/13 1643 07/20/13 0635  NA 133* 132*  K 3.4* 3.7  CL 97 98  CO2 26 24  GLUCOSE 125* 157*  BUN 23 20  CREATININE 0.90 0.71  CALCIUM 8.9 8.5   INR  Date Value Range Status  07/20/2013 1.25  0.00 - 1.49 Final     Exam Bilateral heels examined with no evidence of skin breakdown, i floated them and put lotion on at his request   He bled through his post op bandages but no active bleeding     Plan Dressing change OOB with PT/OT WBAT to LLE Will follow Care per medicine team  [facility] for Dr.Kevin Supple 07/20/2013, 8:46 AM

## 2013-07-20 NOTE — Progress Notes (Signed)
BP checked by nurse tech at 1300. Patient had a BP of 76/38. Rechecked BP manually, BP remained low. (see vitals flowsheet). Dr. Rhona Leavens notified of low BP. Bolus of NS @ x1 ordered. Rechecked again and BP increased to 96/48. Dr. Rhona Leavens also made aware of patient's acute onset of confusion. In and out cath ordered and U/A sent to lab. All other vital signs are stable. Will continue to monitor.

## 2013-07-20 NOTE — Progress Notes (Signed)
Rehab Admissions Coordinator Note:  Patient was screened by Brock Ra for appropriateness for an Inpatient Acute Rehab Consult.  At this time, we are recommending Inpatient Rehab consult.  Brock Ra 07/20/2013, 3:00 PM  I can be reached at [phone].

## 2013-07-20 NOTE — Progress Notes (Signed)
[facility] PROGRESS NOTE  Andre Mueller BJY:[id number] DOB: November 03, 1920 DOA: 07/19/2013 PCP: Rene Paci, MD  Assessment/Plan: 1) Left proximal femur intertrochanteric fracture: - Pt is s/p surgery last night. - Pain well controlled 2) HTN: continue home meds.  3) CAD s/p CABG: Stable. Denies any CP at home with exertion. Denies SOB. 4) FEN: renal function normal. Potassium normal this AM 5) Proph: SCDs.  6) Code: FULL.  Code Status: Full Family Communication: Pt in room (indicate person spoken with, relationship, and if by phone, the number) Disposition Plan: Pending   Consultants:  Ortho  Procedures:  S/p ORIF L hip  HPI/Subjective: No complaints. Reports tolerating surgery well  Objective: Filed Vitals:   07/19/13 2245 07/19/13 2315 07/19/13 2345 07/20/13 0557  BP:   144/63 118/52  Pulse:   74 76  Temp: 98.3 F (36.8 C) 97.7 F (36.5 C) 97.8 F (36.6 C) 97.3 F (36.3 C)  TempSrc:   Oral   Resp:   16 18  Height:   5\' 10"  (1.778 m)   Weight:   73.5 kg (162 lb 0.6 oz)   SpO2: 99% 98% 97% 98%    Intake/Output Summary (Last 24 hours) at 07/20/13 0837 Last data filed at 07/20/13 0600  Gross per 24 hour  Intake   1220 ml  Output    925 ml  Net    295 ml   Filed Weights   07/19/13 1556 07/19/13 2345  Weight: 72.576 kg (160 lb) 73.5 kg (162 lb 0.6 oz)    Exam:   General:  Awake, in nad  Cardiovascular: regular, s1, s2  Respiratory: normal resp effort, no wheezing  Abdomen: soft, nondistended  Musculoskeletal: perfused, no clubbing   Data Reviewed: Basic Metabolic Panel:  Recent Labs Lab 07/19/13 1643 07/20/13 0635  NA 133* 132*  K 3.4* 3.7  CL 97 98  CO2 26 24  GLUCOSE 125* 157*  BUN 23 20  CREATININE 0.90 0.71  CALCIUM 8.9 8.5   Liver Function Tests:  Recent Labs Lab 07/20/13 0635  AST 17  ALT 11  ALKPHOS 54  BILITOT 1.1  PROT 7.7  ALBUMIN 2.9*   No results found for this basename: LIPASE, AMYLASE,  in the last  168 hours No results found for this basename: AMMONIA,  in the last 168 hours CBC:  Recent Labs Lab 07/19/13 1643 07/20/13 0635  WBC 9.6 7.6  NEUTROABS 7.4  --   HGB 12.8* 10.8*  HCT 36.3* 30.9*  MCV 96.0 95.7  PLT 155 143*   Cardiac Enzymes: No results found for this basename: CKTOTAL, CKMB, CKMBINDEX, TROPONINI,  in the last 168 hours BNP (last 3 results) No results found for this basename: PROBNP,  in the last 8760 hours CBG: No results found for this basename: GLUCAP,  in the last 168 hours  No results found for this or any previous visit (from the past 240 hour(s)).   Studies: Dg Chest 1 View  07/19/2013   CLINICAL DATA:  Coronary artery disease. Status post fall.  EXAM: CHEST - 1 VIEW  COMPARISON:  None.  FINDINGS: Previous median sternotomy and CABG procedure. The lung volumes appear slightly diminished and there is atelectasis in the left base. No airspace consolidation identified. No displaced rib fractures identified.  IMPRESSION: 1. Decreased lung volumes and left base atelectasis.  2. Previous CABG.   Electronically Signed   By: Signa Kell M.D.   On: 07/19/2013 17:12   Dg Hip Complete Left  07/19/2013  CLINICAL DATA:  Left hip pain  EXAM: LEFT HIP - COMPLETE 2+ VIEW  COMPARISON:  None  FINDINGS: There is a lucency which extends through the intertrochanteric region of the proximal left femur. This is suspicious for a nondisplaced proximal femur fracture. No dislocation identified. The right hip appears normal.  IMPRESSION: 1. Suspect nondisplaced intertrochanteric fracture of the proximal left femur.   Electronically Signed   By: Signa Kell M.D.   On: 07/19/2013 17:16   Dg Hip Operative Left  07/19/2013   CLINICAL DATA:  Left hip fracture. Intra medullary nail fixation.  EXAM: OPERATIVE LEFT HIP  COMPARISON:  Left hip radiographs same date.  FINDINGS: 4 spot fluoroscopic images demonstrate left femoral neck dynamic screw and intramedullary nail fixation of the  nondisplaced intertrochanteric fracture. The hardware appears well positioned. No complications are demonstrated. There are vascular surgical clips medially in the left 5.  IMPRESSION: Fixation of left intertrochanteric fracture without demonstrated complication.   Electronically Signed   By: Roxy Horseman   On: 07/19/2013 23:15    Scheduled Meds: . amLODipine  5 mg Oral Daily   And  . benazepril  40 mg Oral Daily  . bisoprolol-hydrochlorothiazide  1 tablet Oral Daily  . clindamycin (CLEOCIN) IV  600 mg Intravenous Q6H  . docusate sodium  100 mg Oral BID  . enoxaparin (LOVENOX) injection  40 mg Subcutaneous Q24H  . simvastatin  10 mg Oral q1800  . sodium chloride  3 mL Intravenous Q12H   Continuous Infusions: . dextrose 5 % and 0.45 % NaCl with KCl 20 mEq/L 75 mL/hr at 07/20/13 1610    Principal Problem:   Hip fracture, left Active Problems:   Hypertension   Hyperlipidemia   CAD (coronary artery disease)   Time spent:  CHIU, STEPHEN K  [facility] Pager [phone]. If 7PM-7AM, please contact night-coverage at [email or web address], password [facility] 07/20/2013, 8:37 AM  LOS: 1 day

## 2013-07-21 ENCOUNTER — Encounter (HOSPITAL_COMMUNITY): Payer: Self-pay | Admitting: Orthopedic Surgery

## 2013-07-21 ENCOUNTER — Other Ambulatory Visit: Payer: Medicare Other

## 2013-07-21 DIAGNOSIS — D649 Anemia, unspecified: Secondary | ICD-10-CM

## 2013-07-21 LAB — URINE CULTURE
Colony Count: NO GROWTH
Culture: NO GROWTH

## 2013-07-21 LAB — CBC
MCH: 33.2 pg (ref 26.0–34.0)
MCV: 95.6 fL (ref 78.0–100.0)
Platelets: 101 10*3/uL — ABNORMAL LOW (ref 150–400)
RDW: 14.3 % (ref 11.5–15.5)
WBC: 8 10*3/uL (ref 4.0–10.5)

## 2013-07-21 LAB — BASIC METABOLIC PANEL
CO2: 20 mEq/L (ref 19–32)
Calcium: 8.5 mg/dL (ref 8.4–10.5)
Creatinine, Ser: 0.77 mg/dL (ref 0.50–1.35)
GFR calc Af Amer: >90 mL/min (ref >90)
Glucose, Bld: 128 mg/dL — ABNORMAL HIGH (ref 70–99)

## 2013-07-21 NOTE — Progress Notes (Signed)
[facility] PROGRESS NOTE  Andre Mueller ZOX:[id number] DOB: 05/27/1921 DOA: 07/19/2013 PCP: Rene Paci, MD  Assessment/Plan: 1) Left proximal femur intertrochanteric fracture: - Pt is s/p surgery last night. 2) HTN: Stable presently, had been hypotensive overnight. Will hold bp meds temporarily with plans to resume when urine output increases and when BP improves 3) CAD s/p CABG: Stable. Denies any CP at home with exertion. Denies SOB. 4) FEN: renal function normal. Potassium normal this AM 5) Proph: SCDs.  6) Code: FULL.  Code Status: Full Family Communication: Pt in room (indicate person spoken with, relationship, and if by phone, the number) Disposition Plan: Pending  Consultants:  Ortho  Procedures:  S/p ORIF L hip 07/20/13  HPI/Subjective: No complaints.  Objective: Filed Vitals:   07/20/13 2100 07/21/13 0000 07/21/13 0400 07/21/13 0500  BP: 104/51   120/55  Pulse: 80   87  Temp: 99.2 F (37.3 C)   97.5 F (36.4 C)  TempSrc: Oral   Oral  Resp: 20 18 17 20   Height:      Weight:      SpO2: 95% 94% 95% 93%    Intake/Output Summary (Last 24 hours) at 07/21/13 0758 Last data filed at 07/20/13 1500  Gross per 24 hour  Intake      0 ml  Output    100 ml  Net   -100 ml   Filed Weights   07/19/13 1556 07/19/13 2345  Weight: 72.576 kg (160 lb) 73.5 kg (162 lb 0.6 oz)    Exam:   General:  Awake, in nad  Cardiovascular: regular, s1, s2  Respiratory: normal resp effort, no wheezing  Abdomen: soft, nondistended  Musculoskeletal: perfused, no clubbing   Data Reviewed: Basic Metabolic Panel:  Recent Labs Lab 07/19/13 1643 07/20/13 0635  NA 133* 132*  K 3.4* 3.7  CL 97 98  CO2 26 24  GLUCOSE 125* 157*  BUN 23 20  CREATININE 0.90 0.71  CALCIUM 8.9 8.5   Liver Function Tests:  Recent Labs Lab 07/20/13 0635  AST 17  ALT 11  ALKPHOS 54  BILITOT 1.1  PROT 7.7  ALBUMIN 2.9*   No results found for this basename: LIPASE, AMYLASE,   in the last 168 hours No results found for this basename: AMMONIA,  in the last 168 hours CBC:  Recent Labs Lab 07/19/13 1643 07/20/13 0635  WBC 9.6 7.6  NEUTROABS 7.4  --   HGB 12.8* 10.8*  HCT 36.3* 30.9*  MCV 96.0 95.7  PLT 155 143*   Cardiac Enzymes: No results found for this basename: CKTOTAL, CKMB, CKMBINDEX, TROPONINI,  in the last 168 hours BNP (last 3 results) No results found for this basename: PROBNP,  in the last 8760 hours CBG: No results found for this basename: GLUCAP,  in the last 168 hours  No results found for this or any previous visit (from the past 240 hour(s)).   Studies: Dg Chest 1 View  07/19/2013   CLINICAL DATA:  Coronary artery disease. Status post fall.  EXAM: CHEST - 1 VIEW  COMPARISON:  None.  FINDINGS: Previous median sternotomy and CABG procedure. The lung volumes appear slightly diminished and there is atelectasis in the left base. No airspace consolidation identified. No displaced rib fractures identified.  IMPRESSION: 1. Decreased lung volumes and left base atelectasis.  2. Previous CABG.   Electronically Signed   By: Signa Kell M.D.   On: 07/19/2013 17:12   Dg Hip Complete Left  07/19/2013   CLINICAL  DATA:  Left hip pain  EXAM: LEFT HIP - COMPLETE 2+ VIEW  COMPARISON:  None  FINDINGS: There is a lucency which extends through the intertrochanteric region of the proximal left femur. This is suspicious for a nondisplaced proximal femur fracture. No dislocation identified. The right hip appears normal.  IMPRESSION: 1. Suspect nondisplaced intertrochanteric fracture of the proximal left femur.   Electronically Signed   By: Signa Kell M.D.   On: 07/19/2013 17:16   Dg Hip Operative Left  07/19/2013   CLINICAL DATA:  Left hip fracture. Intra medullary nail fixation.  EXAM: OPERATIVE LEFT HIP  COMPARISON:  Left hip radiographs same date.  FINDINGS: 4 spot fluoroscopic images demonstrate left femoral neck dynamic screw and intramedullary nail fixation  of the nondisplaced intertrochanteric fracture. The hardware appears well positioned. No complications are demonstrated. There are vascular surgical clips medially in the left 5.  IMPRESSION: Fixation of left intertrochanteric fracture without demonstrated complication.   Electronically Signed   By: Roxy Horseman   On: 07/19/2013 23:15    Scheduled Meds: . amLODipine  5 mg Oral Daily   And  . benazepril  40 mg Oral Daily  . bisoprolol-hydrochlorothiazide  1 tablet Oral Daily  . docusate sodium  100 mg Oral BID  . enoxaparin (LOVENOX) injection  40 mg Subcutaneous Q24H  . simvastatin  10 mg Oral q1800  . sodium chloride  3 mL Intravenous Q12H   Continuous Infusions: . sodium chloride      Principal Problem:   Hip fracture, left Active Problems:   Hypertension   Hyperlipidemia   CAD (coronary artery disease)  Time spent:  Audley Hinojos K  [facility] Pager [phone]. If 7PM-7AM, please contact night-coverage at [email or web address], password [facility] 07/21/2013, 7:58 AM  LOS: 2 days

## 2013-07-21 NOTE — Progress Notes (Signed)
Pt has been able to void a total of 450cc through out the night. Around 0500 pt pulled his IV out and refuses for it to be inserted back. Also pt refuses anything for pain. Will cont to monitor.

## 2013-07-21 NOTE — Progress Notes (Signed)
Physical Therapy Treatment Patient Details Name: Andre Mueller MRN: [id number] DOB: 06-Mar-1921 Today's Date: 07/21/2013 Time: 4098-1191 PT Time Calculation (min): 17 min  PT Assessment / Plan / Recommendation  History of Present Illness s/p ORIF for hip fx LLE; WBAT   PT Comments   Patient able to make some progress today and take a few more steps. Limited by pain overall. Provided lots of encouragement. Continue to recommend comprehensive inpatient rehab (CIR) for post-acute therapy needs.   Follow Up Recommendations  CIR     Does the patient have the potential to tolerate intense rehabilitation     Barriers to Discharge        Equipment Recommendations  Rolling walker with 5" wheels;3in1 (PT)    Recommendations for Other Services    Frequency Min 6X/week   Progress towards PT Goals Progress towards PT goals: Progressing toward goals  Plan Current plan remains appropriate    Precautions / Restrictions Precautions Precautions: Fall Restrictions LLE Weight Bearing: Weight bearing as tolerated   Pertinent Vitals/Pain Patient stated it hurt but did not rate    Mobility  Bed Mobility Bed Mobility: Sit to Supine Sit to Supine: 1: +2 Total assist Sit to Supine: Patient Percentage: 50% Details for Bed Mobility Assistance: A with all aspects of back to bed. Patient needing assistance lift BLEs. A and cues for positioning back into bed Transfers Sit to Stand: 1: +2 Total assist;From chair/3-in-1;With upper extremity assist Sit to Stand: Patient Percentage: 60% Stand to Sit: 3: Mod assist;4: Min assist;To bed Details for Transfer Assistance: Cues for technique, hand placement and prepositioning LLE for comfort; physical assist to control descent Ambulation/Gait Ambulation/Gait Assistance: 1: +2 Total assist Ambulation/Gait: Patient Percentage: 70% Ambulation Distance (Feet): 4 Feet Assistive device: Rolling walker Ambulation/Gait Assistance Details: Patient able to side step  over to bed from recliner. Cues for postioning and technique throughout.  Gait Pattern: Step-to pattern    Exercises     PT Diagnosis:    PT Problem List:   PT Treatment Interventions:     PT Goals (current goals can now be found in the care plan section)    Visit Information  Last PT Received On: 07/21/13 Assistance Needed: +1 History of Present Illness: s/p ORIF for hip fx LLE; WBAT    Subjective Data      Cognition  Cognition Arousal/Alertness: Awake/alert Behavior During Therapy: WFL for tasks assessed/performed Overall Cognitive Status: Within Functional Limits for tasks assessed    Balance     End of Session PT - End of Session Equipment Utilized During Treatment: Gait belt Activity Tolerance: Patient tolerated treatment well Patient left: in chair;with call bell/phone within reach   GP     Fredrich Birks 07/21/2013, 11:57 AM 07/21/2013 Fredrich Birks PTA [phone] pager [phone] office

## 2013-07-21 NOTE — Evaluation (Signed)
Occupational Therapy Evaluation Patient Details Name: Andre Mueller MRN: [id number] DOB: [birth year] Today's Date: 07/21/2013 Time: 4098-1191 OT Time Calculation (min): 38 min  OT Assessment / Plan / Recommendation History of present illness s/p ORIF for hip fx LLE; WBAT   Clinical Impression   Patient is s/p LLE ORIF resulting in functional limitations due to the deficits listed below (see OT Problem List).  Patient will benefit from skilled OT to increase his independence and safety with functional mobility to allow discharge to the venue listed below.     OT Assessment  Patient needs continued OT Services    Follow Up Recommendations  CIR    Barriers to Discharge Decreased caregiver support Wife unable to provide much physical assistance due to decreased health  Equipment Recommendations  None recommended by OT    Recommendations for Other Services Rehab consult  Frequency  Min 2X/week    Precautions / Restrictions Precautions Precautions: Fall Restrictions LLE Weight Bearing: Weight bearing as tolerated   Pertinent Vitals/Pain "Sharp" pain in left hip joint area (anterior) with activity-ambulating to bathroom, not rated, RN aware and reports only given one of possible 2 pain pills due to reports that increased pain medication may be causing confusion.    ADL  Toilet Transfer: Performed;Moderate assistance Toilet Transfer Method: Stand pivot;Sit to Barista: Comfort height toilet;Grab bars Toileting - Clothing Manipulation and Hygiene: Performed;Minimal assistance ADL Comments: Declined to participate in BADL or simulated BADL due to constipated and need for increased time on commode.    OT Diagnosis: Generalized weakness;Acute pain  OT Problem List: Decreased strength;Decreased activity tolerance;Impaired balance (sitting and/or standing);Decreased knowledge of use of DME or AE;Cardiopulmonary status limiting activity;Pain OT Treatment  Interventions: Self-care/ADL training;Therapeutic exercise;Energy conservation;DME and/or AE instruction;Therapeutic activities;Patient/family education   OT Goals(Current goals can be found in the care plan section)    Visit Information  Last OT Received On: 07/21/13 Assistance Needed: +1 History of Present Illness: s/p ORIF for hip fx LLE; WBAT       Prior Functioning     Home Living Family/patient expects to be discharged to:: Inpatient rehab Living Arrangements: Spouse/significant other Available Help at Discharge: Family (RN checks on them and CNA 2hrs in am 2hrs in pm) Type of Home: Independent living facility (ALF) Home Equipment: Walker - 2 wheels;Hand held shower head;Shower seat;Grab bars - toilet;Grab bars - tub/shower Additional Comments: Wife says she feels like pt forgets RW a lot Prior Function Level of Independence: Needs assistance Gait / Transfers Assistance Needed: RW; walks to dining hall ADL's / Homemaking Assistance Needed: CNA (?private pay) assists with bath and dress 5 days/week Communication Communication: No difficulties    Cognition  Cognition Arousal/Alertness: Awake/alert Behavior During Therapy: WFL for tasks assessed/performed Overall Cognitive Status: Within Functional Limits for tasks assessed    Extremity/Trunk Assessment Upper Extremity Assessment Upper Extremity Assessment: Overall WFL for tasks assessed Lower Extremity Assessment Lower Extremity Assessment: Defer to PT evaluation LLE Deficits / Details: limited by "sharp" pain      Mobility Bed Mobility Bed Mobility: Sit to Supine Sit to Supine: 1: +2 Total assist Sit to Supine: Patient Percentage: 50% Details for Bed Mobility Assistance: A with all aspects of back to bed. Patient needing assistance lift BLEs. A and cues for positioning back into bed Transfers Sit to Stand: 4: Min assist;With upper extremity assist;From bed Sit to Stand: Patient Percentage: 60% Stand to Sit: To  chair/3-in-1;To elevated surface;4: Min assist;With upper extremity assist (heavily used grab bar)  Details for Transfer Assistance: Cues for technique, hand placement and prepositioning LLE for comfort; physical assist to control descent     End of Session OT - End of Session Equipment Utilized During Treatment: Rolling walker Activity Tolerance: Patient limited by pain Patient left: with family/visitor present (on commode with string to pull for assistance, NT aware) Nurse Communication:  (NT and RN notified to transfer to recliner from commode)  GO     Khristina Janota 07/21/2013, 12:37 PM

## 2013-07-21 NOTE — Progress Notes (Signed)
Subjective: 2 Days Post-Op Procedure(s) (LRB): OPEN REDUCTION INTERNAL FIXATION HIP (Left) Patient reports pain as 4 on 0-10 scale.  No nausea or vomiting  Objective: Vital signs in last 24 hours: Temp:  [97.5 F (36.4 C)-99.2 F (37.3 C)] 97.6 F (36.4 C) (09/23 1300) Pulse Rate:  [71-87] 75 (09/23 1300) Resp:  [17-20] 20 (09/23 1300) BP: (94-120)/(43-55) 109/47 mmHg (09/23 1300) SpO2:  [93 %-96 %] 96 % (09/23 1300)  Intake/Output from previous day: 09/22 0701 - 09/23 0700 In: 787.5 [I.V.:787.5] Out: 580 [Urine:580] Intake/Output this shift:     Recent Labs  07/19/13 1643 07/20/13 0635 07/21/13 0855  HGB 12.8* 10.8* 9.8*    Recent Labs  07/20/13 0635 07/21/13 0855  WBC 7.6 8.0  RBC 3.23* 2.95*  HCT 30.9* 28.2*  PLT 143* 101*    Recent Labs  07/20/13 0635 07/21/13 0855  NA 132* 130*  K 3.7 3.7  CL 98 96  CO2 24 20  BUN 20 20  CREATININE 0.71 0.77  GLUCOSE 157* 128*  CALCIUM 8.5 8.5    Recent Labs  07/19/13 1824 07/20/13 0635  INR 1.24 1.25    ABD soft Sensation intact distally Intact pulses distally Dorsiflexion/Plantar flexion intact Compartment soft Dressings intact and no sign of active bleeding  Assessment/Plan: 2 Days Post-Op Procedure(s) (LRB): OPEN REDUCTION INTERNAL FIXATION HIP (Left) Advance diet Up with therapy  Andre Mueller 07/21/2013, 3:52 PM

## 2013-07-22 DIAGNOSIS — W19XXXA Unspecified fall, initial encounter: Secondary | ICD-10-CM

## 2013-07-22 DIAGNOSIS — S72143A Displaced intertrochanteric fracture of unspecified femur, initial encounter for closed fracture: Secondary | ICD-10-CM

## 2013-07-22 LAB — CBC
Hemoglobin: 8.5 g/dL — ABNORMAL LOW (ref 13.0–17.0)
MCHC: 34.8 g/dL (ref 30.0–36.0)
RBC: 2.55 MIL/uL — ABNORMAL LOW (ref 4.22–5.81)
RDW: 14.3 % (ref 11.5–15.5)

## 2013-07-22 LAB — BASIC METABOLIC PANEL
GFR calc Af Amer: >90 mL/min (ref >90)
GFR calc non Af Amer: 84 mL/min — ABNORMAL LOW (ref >90)
Potassium: 3.3 mEq/L — ABNORMAL LOW (ref 3.5–5.1)
Sodium: 132 mEq/L — ABNORMAL LOW (ref 135–145)

## 2013-07-22 MED ORDER — SENNA 8.6 MG PO TABS
2.0000 | ORAL_TABLET | Freq: Two times a day (BID) | ORAL | Status: DC
  Administered 2013-07-22 – 2013-07-23 (×2): 17.2 mg via ORAL
  Filled 2013-07-22 (×6): qty 2

## 2013-07-22 MED ORDER — POLYETHYLENE GLYCOL 3350 17 G PO PACK: 17.0000 g | PACK | Freq: Two times a day (BID) | ORAL | Status: DC

## 2013-07-22 MED ORDER — FLEET ENEMA 7-19 GM/118ML RE ENEM
1.0000 | ENEMA | Freq: Every day | RECTAL | Status: DC | PRN
  Administered 2013-07-22: 1 via RECTAL
  Filled 2013-07-22: qty 1

## 2013-07-22 MED ORDER — POLYETHYLENE GLYCOL 3350 17 G PO PACK
17.0000 g | PACK | Freq: Three times a day (TID) | ORAL | Status: DC
  Administered 2013-07-22 – 2013-07-23 (×3): 17 g via ORAL
  Filled 2013-07-22 (×6): qty 1

## 2013-07-22 MED ORDER — ASPIRIN EC 325 MG PO TBEC: 325.0000 mg | DELAYED_RELEASE_TABLET | Freq: Every day | ORAL | Status: DC

## 2013-07-22 MED ORDER — POLYETHYLENE GLYCOL 3350 17 G PO PACK
17.0000 g | PACK | Freq: Two times a day (BID) | ORAL | Status: DC
  Filled 2013-07-22: qty 1

## 2013-07-22 NOTE — Progress Notes (Signed)
PATIENT DETAILS Name: Andre Mueller Age: 79 y.o. Sex: male Date of Birth: 1920-11-01 Admit Date: 07/19/2013 Admitting Physician Jonah Blue, DO ZOX:[id number] Felicity Coyer, MD  Subjective: Mild nausea this morning, no vomiting, tolerated breakfast well. Main complaint is constipation.  Assessment/Plan: Principal Problem:   Hip fracture, left - Status post left hip open reduction and internal fixation-3 days postop - Stable, orthopedics following  Active Problems: Anemia - Secondary to perioperative blood loss - Monitor H&H and transfuse accordingly    Hypertension - BP currently controlled without antihypertensive medications, postoperative course complicated by brief hypotension that has responded to fluids - Resume antihypertensive medications when possible  Dyslipidemia - Continue with statin  History of CAD  - Status post CABG in 2007 - Resume aspirin - Continue with statins - Resume beta blocker when able  Constipation - .Scheduled MiraLax and Senokot, when necessary enema and Dulcolax  Disposition: Remain inpatient-SNF tomorrow-if stable  DVT Prophylaxis: Prophylactic Lovenox   Code Status: Full code   Family Communication None at bedside  Procedures: -ORIF left hip-9/21  CONSULTS:  orthopedic surgery   MEDICATIONS: Scheduled Meds: . enoxaparin (LOVENOX) injection  40 mg Subcutaneous Q24H  . polyethylene glycol  17 g Oral BID  . simvastatin  10 mg Oral q1800  . sodium chloride  3 mL Intravenous Q12H   Continuous Infusions:  PRN Meds:.acetaminophen, acetaminophen, alum & mag hydroxide-simeth, bisacodyl, HYDROcodone-acetaminophen, menthol-cetylpyridinium, metoCLOPramide (REGLAN) injection, metoCLOPramide, morphine injection, nitroGLYCERIN, ondansetron (ZOFRAN) IV, ondansetron, phenol, sodium phosphate  Antibiotics: Anti-infectives   Start     Dose/Rate Route Frequency Ordered Stop   07/20/13 0400  clindamycin (CLEOCIN) IVPB 600 mg     600 mg 100  mL/hr over 30 Minutes Intravenous Every 6 hours 07/20/13 0034 07/20/13 1201   07/19/13 2030  clindamycin (CLEOCIN) IVPB 900 mg     900 mg 100 mL/hr over 30 Minutes Intravenous  Once 07/19/13 2029 07/19/13 2143       PHYSICAL EXAM: Vital signs in last 24 hours: Filed Vitals:   07/22/13 0543 07/22/13 0800 07/22/13 1159 07/22/13 1300  BP: 137/56   139/59  Pulse: 74   86  Temp: 97.7 F (36.5 C)   98.3 F (36.8 C)  TempSrc:      Resp: 16 16 18 18   Height:      Weight:      SpO2: 96% 97% 98% 98%    Weight change:  Filed Weights   07/19/13 1556 07/19/13 2345  Weight: 72.576 kg (160 lb) 73.5 kg (162 lb 0.6 oz)   Body mass index is 23.25 kg/(m^2).   Gen Exam: Awake and alert with clear speech.   Neck: Supple, No JVD.   Chest: B/L Clear.   CVS: S1 S2 Regular, no murmurs.  Abdomen: soft, BS +, non tender, non distended.  Extremities: no edema, lower extremities warm to touch. Neurologic: Non Focal.   Skin: No Rash.   Wounds: N/A.    Intake/Output from previous day:  Intake/Output Summary (Last 24 hours) at 07/22/13 1532 Last data filed at 07/22/13 0900  Gross per 24 hour  Intake      0 ml  Output    775 ml  Net   -775 ml     LAB RESULTS: CBC  Recent Labs Lab 07/19/13 1643 07/20/13 0635 07/21/13 0855 07/22/13 0855  WBC 9.6 7.6 8.0 5.9  HGB 12.8* 10.8* 9.8* 8.5*  HCT 36.3* 30.9* 28.2* 24.4*  PLT 155 143* 101* 116*  MCV 96.0 95.7 95.6 95.7  MCH  33.9 33.4 33.2 33.3  MCHC 35.3 35.0 34.8 34.8  RDW 14.3 14.3 14.3 14.3  LYMPHSABS 1.4  --   --   --   MONOABS 0.8  --   --   --   EOSABS 0.1  --   --   --   BASOSABS 0.0  --   --   --     Chemistries   Recent Labs Lab 07/19/13 1643 07/20/13 0635 07/21/13 0855 07/22/13 0855  NA 133* 132* 130* 132*  K 3.4* 3.7 3.7 3.3*  CL 97 98 96 100  CO2 26 24 20 24   GLUCOSE 125* 157* 128* 124*  BUN 23 20 20 17   CREATININE 0.90 0.71 0.77 0.65  CALCIUM 8.9 8.5 8.5 8.2*    CBG: No results found for this basename:  GLUCAP,  in the last 168 hours  GFR Estimated Creatinine Clearance: 64.6 ml/min (by C-G formula based on Cr of 0.65).  Coagulation profile  Recent Labs Lab 07/19/13 1824 07/20/13 0635  INR 1.24 1.25    Cardiac Enzymes No results found for this basename: CK, CKMB, TROPONINI, MYOGLOBIN,  in the last 168 hours  No components found with this basename: POCBNP,  No results found for this basename: DDIMER,  in the last 72 hours No results found for this basename: HGBA1C,  in the last 72 hours No results found for this basename: CHOL, HDL, LDLCALC, TRIG, CHOLHDL, LDLDIRECT,  in the last 72 hours No results found for this basename: TSH, T4TOTAL, FREET3, T3FREE, THYROIDAB,  in the last 72 hours No results found for this basename: VITAMINB12, FOLATE, FERRITIN, TIBC, IRON, RETICCTPCT,  in the last 72 hours No results found for this basename: LIPASE, AMYLASE,  in the last 72 hours  Urine Studies No results found for this basename: UACOL, UAPR, USPG, UPH, UTP, UGL, UKET, UBIL, UHGB, UNIT, UROB, ULEU, UEPI, UWBC, URBC, UBAC, CAST, CRYS, UCOM, BILUA,  in the last 72 hours  MICROBIOLOGY: Recent Results (from the past 240 hour(s))  URINE CULTURE     Status: None   Collection Time    07/20/13  3:25 PM      Result Value Range Status   Specimen Description URINE, CATHETERIZED   Final   Special Requests NONE   Final   Culture  Setup Time     Final   Value: 07/20/2013 16:00     Performed at [facility] Count     Final   Value: NO GROWTH     Performed at [facility]   Culture     Final   Value: NO GROWTH     Performed at [facility]   Report Status 07/21/2013 FINAL   Final    RADIOLOGY STUDIES/RESULTS: Dg Chest 1 View  07/19/2013   CLINICAL DATA:  Coronary artery disease. Status post fall.  EXAM: CHEST - 1 VIEW  COMPARISON:  None.  FINDINGS: Previous median sternotomy and CABG procedure. The lung volumes appear slightly diminished and there is atelectasis  in the left base. No airspace consolidation identified. No displaced rib fractures identified.  IMPRESSION: 1. Decreased lung volumes and left base atelectasis.  2. Previous CABG.   Electronically Signed   By: Signa Kell M.D.   On: 07/19/2013 17:12   Dg Hip Complete Left  07/19/2013   CLINICAL DATA:  Left hip pain  EXAM: LEFT HIP - COMPLETE 2+ VIEW  COMPARISON:  None  FINDINGS: There is a lucency which extends through  the intertrochanteric region of the proximal left femur. This is suspicious for a nondisplaced proximal femur fracture. No dislocation identified. The right hip appears normal.  IMPRESSION: 1. Suspect nondisplaced intertrochanteric fracture of the proximal left femur.   Electronically Signed   By: Signa Kell M.D.   On: 07/19/2013 17:16   Dg Hip Operative Left  07/19/2013   CLINICAL DATA:  Left hip fracture. Intra medullary nail fixation.  EXAM: OPERATIVE LEFT HIP  COMPARISON:  Left hip radiographs same date.  FINDINGS: 4 spot fluoroscopic images demonstrate left femoral neck dynamic screw and intramedullary nail fixation of the nondisplaced intertrochanteric fracture. The hardware appears well positioned. No complications are demonstrated. There are vascular surgical clips medially in the left 5.  IMPRESSION: Fixation of left intertrochanteric fracture without demonstrated complication.   Electronically Signed   By: Roxy Horseman   On: 07/19/2013 23:15   Dg Swallowing Func-speech Pathology  07/09/2013   Riley Nearing [place], CCC-SLP     07/09/2013  2:29 PM Objective Swallowing Evaluation: Modified Barium Swallowing Study   Patient Details  Name: Errol Ala MRN: [id number] Date of Birth: Jun 28, 1921  Today's Date: 07/09/2013 Time: 1020-1100 SLP Time Calculation (min): 40 min  Past Medical History:  Past Medical History  Diagnosis Date  . Hypercholesteremia   . Anxiety   . GERD (gastroesophageal reflux disease)   . Glaucoma   . Esophageal tear 1990's?  . Hypertension   . Constipation   .  Cancer     hx of waldrens disease  . Benign neoplasm of colon 02/2012    polyps x 2 adenomatous w/o high grade dysplasia  . CAD (coronary artery disease) 2007    s/p CABG in [place]  . Diverticulosis of colon (without mention of hemorrhage)     hx LGIB, 04/2012 hosp  . History of resection of small bowel   . Osteoporosis, senile     hx T11 compression fx 02/2012, s/p KP   Past Surgical History:  Past Surgical History  Procedure Laterality Date  . Cardiac surgery    . Appendectomy    . Hernia repair    . Colonoscopy  02/29/2012    Procedure: COLONOSCOPY;  Surgeon: Hart Carwin, MD;  Location:  [facility];  Service: Endoscopy;  Laterality: N/A;  . Esophagus surgery      due to esophageal tear  . Small intestine surgery      20 yrs ago; in [place]  . Coronary artery bypass graft      > 6 yrs  . Kyphosis surgery     HPI:  Pt is an 79 year old male, living in Independent Living, arriving  for an outpatient MBS recommended by Dr. Dulce Sellar of [facility]. Pt  reports frequent productive coughing though he feels this is  related to sinus drainage. He denis globus or regurgitation and  says coughing is not necessarily related to eating. He wife  however reports that pts will often have hard coughing at the end  of a meal, expectorating large quantities of foamy mucous though  it doesnt ahve food in it. Pt denies any recent pna, but does  report his esophagus burst requiring surgery. No history of CVA  and no dx of GERD per pt     Assessment / Plan / Recommendation Clinical Impression  Dysphagia Diagnosis: Suspected primary esophageal  dysphagia;Moderate pharyngeal phase dysphagia Clinical impression: Pt presents with a moderate pharyngeal  dysphagia as well as concern for a significant esophageal  impairment. Pt  observed to orally manipulate POs well, though  there is a slight delay in airway protection with thin liquids  leading to trace silent penetration to the cords in 50% of  trials. A chin tuck worsened aspiration, but a cued  throat clear  removed most penetrate/aspirate. In between swallows with thin  and nectar thick, barium on cords increased though residuals were  trace to mild and no significant pharyngeal weakness noted.  Question if pt producing copious secretions which were mixing  with barium and contacting cords? Also concerned about secretions  as pt began drooling during session, suspect waterbrash.   Appearance of significant stasis in mid to distal esophagus.  Barium tablet also appeared to lodge at GE junction despite  efforts to transit. Believe pts compaint of coughing at end of  meal and expectoration of mucous likely more related to poor  transit of solid POs through esophagus. Hopeful that Dr. Dulce Sellar  will f/u with pt on esophgeal function. For now, SLP made  recommendations to pt to continue current diet with a volitional  throat clear after every few sips to expel aspirate and also  educated pt on esophageal precautions and strategies. Aspiration  risk is moderate, but thickened liquids will not reduce risk.  Perhaps treatment of esophageal concerns may improve pharyngeal  function.     Treatment Recommendation  No treatment recommended at this time    Diet Recommendation Regular;Thin liquid   Liquid Administration via: Cup Medication Administration: Crushed with puree Supervision: Patient able to self feed;Intermittent supervision  to cue for compensatory strategies Compensations: Slow rate;Small sips/bites;Clear throat  intermittently;Follow solids with liquid Postural Changes and/or Swallow Maneuvers: Seated upright 90  degrees;Upright 30-60 min after meal    Other  Recommendations Recommended Consults: Consider esophageal  assessment;Consider GI evaluation Oral Care Recommendations: Patient independent with oral care   Follow Up Recommendations  None (Pt may benefit from f/u SLP after GI visit)    Frequency and Duration        Pertinent Vitals/Pain NA    SLP Swallow Goals     General HPI: Pt is an 79 year old  male, living in Independent  Living, arriving for an outpatient MBS recommended by Dr. Dulce Sellar  of [facility]. Pt reports frequent productive coughing though he  feels this is related to sinus drainage. He denis globus or  regurgitation and says coughing is not necessarily related to  eating. He wife however reports that pts will often have hard  coughing at the end of a meal, expectorating large quantities of  foamy mucous though it doesnt ahve food in it. Pt denies any  recent pna, but does report his esophagus burst requiring  surgery. No history of CVA and no dx of GERD per pt Type of Study: Modified Barium Swallowing Study Reason for Referral: Objectively evaluate swallowing function Diet Prior to this Study: Regular;Thin liquids Temperature Spikes Noted: No Respiratory Status: Room air History of Recent Intubation: No Behavior/Cognition: Alert;Cooperative;Pleasant mood Oral Cavity - Dentition: Dentures, top;Dentures, bottom Oral Motor / Sensory Function: Within functional limits Self-Feeding Abilities: Able to feed self Patient Positioning: Upright in chair Baseline Vocal Quality: Clear Volitional Cough: Strong Volitional Swallow: Able to elicit Anatomy: Within functional limits Pharyngeal Secretions: Not observed secondary MBS    Reason for Referral Objectively evaluate swallowing function   Oral Phase Oral Preparation/Oral Phase Oral Phase: WFL   Pharyngeal Phase Pharyngeal Phase Pharyngeal Phase: Impaired Pharyngeal - Nectar Pharyngeal - Nectar Cup: Delayed swallow initiation;Pharyngeal  residue -  valleculae;Pharyngeal residue - pyriform  sinuses;Pharyngeal residue - posterior  pharnyx;Penetration/Aspiration after swallow (minimal residuals) Penetration/Aspiration details (nectar cup): Material enters  airway, passes BELOW cords without attempt by patient to eject  out (silent aspiration);Material does not enter airway Pharyngeal - Thin Pharyngeal - Thin Cup: Delayed swallow initiation;Reduced   airway/laryngeal closure;Penetration/Aspiration before  swallow;Penetration/Aspiration after swallow;Moderate  aspiration;Trace aspiration;Pharyngeal residue -  valleculae;Pharyngeal residue - pyriform sinuses;Pharyngeal  residue - posterior pharnyx Penetration/Aspiration details (thin cup): Material enters  airway, CONTACTS cords and not ejected out;Material enters  airway, passes BELOW cords without attempt by patient to eject  out (silent aspiration);Material does not enter airway Pharyngeal - Thin Straw: Delayed swallow initiation;Reduced  airway/laryngeal closure;Penetration/Aspiration before  swallow;Penetration/Aspiration after swallow;Moderate  aspiration;Trace aspiration;Pharyngeal residue -  valleculae;Pharyngeal residue - pyriform sinuses;Pharyngeal  residue - posterior pharnyx Penetration/Aspiration details (thin straw): Material does not  enter airway;Material enters airway, CONTACTS cords and not  ejected out;Material enters airway, passes BELOW cords without  attempt by patient to eject out (silent aspiration) Pharyngeal - Solids Pharyngeal - Puree: Within functional limits Pharyngeal - Regular: Within functional limits Pharyngeal - Pill: Delayed swallow initiation  Cervical Esophageal Phase    GO    Cervical Esophageal Phase Cervical Esophageal Phase: Impaired Cervical Esophageal Phase - Comment Cervical Esophageal Comment: see impression statement    Functional Assessment Tool Used: clinical judgement Functional Limitations: Swallowing Swallow Current Status ([id number]): At least 40 percent but less than  60 percent impaired, limited or restricted Swallow Goal Status [phone]): At least 40 percent but less than 60  percent impaired, limited or restricted Swallow Discharge Status [id number]): At least 40 percent but less  than 60 percent impaired, limited or restricted   Harlon Ditty, [place] CCC-SLP [phone]   Claudine Mouton 07/09/2013, 2:26 PM     Jeoffrey Massed, MD  [facility] Pager:[phone]  If 7PM-7AM, please contact night-coverage [email or web address] Password [facility] 07/22/2013, 3:32 PM   LOS: 3 days

## 2013-07-22 NOTE — Consult Note (Signed)
Physical Medicine and Rehabilitation Consult Reason for Consult: Left intertrochanteric hip fracture Referring Physician: [facility]   HPI: Andre Mueller is a 79 y.o. right-handed male with history of CAD, CABG and hypertension. Admitted 07/19/2013 after a fall without loss of consciousness when getting up he uses walker. Patient resides at [facility] with his wife. Patient fell to his left side. X-rays and imaging revealed non-displaced left intertrochanteric hip fracture. Underwent ORIF with intramedullary hip screw 07/19/2012 per Dr. Rennis Chris. Patient weightbearing as tolerated left lower extremity. Placed on subcutaneous Lovenox for DVT prophylaxis. Postoperative anemia 10.8 and monitor. Physical and occupational therapy evaluations completed 07/20/2013 with recommendations of physical medicine rehabilitation consult to consider inpatient rehabilitation services.   Review of Systems  Constitutional: Negative for fever and chills.  HENT: Negative for hearing loss.   Eyes: Negative for blurred vision and double vision.  Cardiovascular: Positive for palpitations.  Gastrointestinal:       GERD  Musculoskeletal: Positive for back pain and joint pain.  Skin: Negative for itching and rash.  Neurological: Negative for headaches.  Psychiatric/Behavioral: Negative for depression and suicidal ideas.       Anxiety  All other systems reviewed and are negative.   Past Medical History  Diagnosis Date  . Hypercholesteremia   . Anxiety   . GERD (gastroesophageal reflux disease)   . Glaucoma   . Esophageal tear 1990's?  . Hypertension   . Constipation   . Cancer     hx of waldrens disease  . Benign neoplasm of colon 02/2012    polyps x 2 adenomatous w/o high grade dysplasia  . CAD (coronary artery disease) 2007    s/p CABG in [place]  . Diverticulosis of colon (without mention of hemorrhage)     hx LGIB, 04/2012 hosp  . History of resection of small bowel   . Osteoporosis,  senile     hx T11 compression fx 02/2012, s/p KP   Past Surgical History  Procedure Laterality Date  . Cardiac surgery    . Appendectomy    . Hernia repair    . Colonoscopy  02/29/2012    Procedure: COLONOSCOPY;  Surgeon: Hart Carwin, MD;  Location: [facility] END[facility]COPY;  Service: Endoscopy;  Laterality: N/A;  . Esophagus surgery      due to esophageal tear  . Small intestine surgery      20 yrs ago; in [place]  . Coronary artery bypass graft      > 6 yrs  . Kyphosis surgery    . Orif hip fracture Left 07/19/2013    Procedure: OPEN REDUCTION INTERNAL FIXATION HIP;  Surgeon: Senaida Lange, MD;  Location: [facility] OR;  Service: Orthopedics;  Laterality: Left;   Family History  Problem Relation Age of Onset  . Colon cancer Neg Hx   . Malignant hyperthermia Neg Hx    Social History:  reports that he quit smoking about 50 years ago. He has never used smokeless tobacco. He reports that he does not drink alcohol or use illicit drugs. Allergies:  Allergies  Allergen Reactions  . Penicillins Swelling   Medications Prior to Admission  Medication Sig Dispense Refill  . bisoprolol-hydrochlorothiazide (ZIAC) 5-6.25 MG per tablet Take 1 tablet by mouth daily.      . fluticasone (FLONASE) 50 MCG/ACT nasal spray Place 1 spray into the nose daily as needed. allergies      . HYDROcodone-acetaminophen (NORCO/VICODIN) 5-325 MG per tablet Take 1 tablet by mouth 2 (two) times daily as needed  for pain.      Marland Kitchen lisinopril (PRINIVIL,ZESTRIL) 40 MG tablet Take 40 mg by mouth daily.      Marland Kitchen LORazepam (ATIVAN) 0.5 MG tablet Take 0.5-1 mg by mouth 2 (two) times daily as needed for anxiety.      . nitroGLYCERIN (NITROSTAT) 0.4 MG SL tablet Place 0.4 mg under the tongue every 5 (five) minutes as needed. Chest pain      . omeprazole (PRILOSEC) 20 MG capsule Take 20 mg by mouth daily.      . simvastatin (ZOCOR) 10 MG tablet Take 10 mg by mouth daily.      Marland Kitchen aspirin EC 81 MG tablet Take 1 tablet (81 mg total) by mouth daily.       . Multiple Vitamin (MULITIVITAMIN WITH MINERALS) TABS Take 1 tablet by mouth daily.      Marland Kitchen OVER THE COUNTER MEDICATION Take 1 tablet by mouth daily as needed (Cough).      . OVER THE COUNTER MEDICATION Apply 1 application topically at bedtime. Apply ointment to back each evening for pain.      Marland Kitchen senna (SENOKOT) 8.6 MG TABS Take 2 tablets by mouth 2 (two) times daily.        Home: Home Living Family/patient expects to be discharged to:: Inpatient rehab Living Arrangements: Spouse/significant other Available Help at Discharge: Family (RN checks on them and CNA 2hrs in am 2hrs in pm) Type of Home: Independent living facility (ALF) Home Equipment: Dan Humphreys - 2 wheels;Hand held shower head;Shower seat;Grab bars - toilet;Grab bars - tub/shower Additional Comments: Wife says she feels like pt forgets RW a lot  Functional History:   Functional Status:  Mobility: Bed Mobility Bed Mobility: Sit to Supine Supine to Sit: 2: Max assist Sitting - Scoot to [place] of Bed: 3: Mod assist;With rail Sit to Supine: 1: +2 Total assist Sit to Supine: Patient Percentage: 50% Transfers Transfers: Sit to Stand;Stand to Sit Sit to Stand: 4: Min assist;With upper extremity assist;From bed Sit to Stand: Patient Percentage: 60% Stand to Sit: To chair/3-in-1;To elevated surface;4: Min assist;With upper extremity assist (heavily used grab bar) Ambulation/Gait Ambulation/Gait Assistance: 1: +2 Total assist Ambulation/Gait: Patient Percentage: 70% Ambulation Distance (Feet): 4 Feet Assistive device: Rolling walker Ambulation/Gait Assistance Details: Patient able to side step over to bed from recliner. Cues for postioning and technique throughout.  Gait Pattern: Step-to pattern    ADL: ADL Toilet Transfer: Performed;Moderate assistance Toilet Transfer Method: Stand pivot;Sit to Barista: Comfort height toilet;Grab bars ADL Comments: Declined to participate in BADL or simulated BADL  due to constipated and need for increased time on commode.  Cognition: Cognition Overall Cognitive Status: Within Functional Limits for tasks assessed Orientation Level: Oriented to person;Oriented to place;Oriented to situation;Disoriented to time Cognition Arousal/Alertness: Awake/alert Behavior During Therapy: WFL for tasks assessed/performed Overall Cognitive Status: Within Functional Limits for tasks assessed  Blood pressure 137/56, pulse 74, temperature 97.7 F (36.5 C), temperature source Oral, resp. rate 16, height 5\' 10"  (1.778 m), weight 73.5 kg (162 lb 0.6 oz), SpO2 97.00%. Physical Exam  Vitals reviewed. Constitutional: He is oriented to person, place, and time.  HENT:  Head: Normocephalic.  Eyes: EOM are normal.  Neck: Normal range of motion. Neck supple. No thyromegaly present.  Cardiovascular: Normal rate and regular rhythm.   Pulmonary/Chest: Effort normal and breath sounds normal. No respiratory distress.  Abdominal: Soft. Bowel sounds are normal. He exhibits no distension.  Neurological: He is alert and oriented to person, place,  and time.  Follows full commands. Moves all 4's. No gross sensory deficits.  Skin:  Hip incision dressed and appropriately tender  Psychiatric: He has a normal mood and affect. His behavior is normal. Thought content normal.    Results for orders placed during the hospital encounter of 07/19/13 (from the past 24 hour(s))  CBC     Status: Abnormal   Collection Time    07/22/13  8:55 AM      Result Value Range   WBC 5.9  4.0 - 10.5 K/uL   RBC 2.55 (*) 4.22 - 5.81 MIL/uL   Hemoglobin 8.5 (*) 13.0 - 17.0 g/dL   HCT 78.2 (*) 95.6 - 21.3 %   MCV 95.7  78.0 - 100.0 fL   MCH 33.3  26.0 - 34.0 pg   MCHC 34.8  30.0 - 36.0 g/dL   RDW 08.6  57.8 - 46.9 %   Platelets 116 (*) 150 - 400 K/uL  BASIC METABOLIC PANEL     Status: Abnormal   Collection Time    07/22/13  8:55 AM      Result Value Range   Sodium 132 (*) 135 - 145 mEq/L    Potassium 3.3 (*) 3.5 - 5.1 mEq/L   Chloride 100  96 - 112 mEq/L   CO2 24  19 - 32 mEq/L   Glucose, Bld 124 (*) 70 - 99 mg/dL   BUN 17  6 - 23 mg/dL   Creatinine, Ser 6.29  0.50 - 1.35 mg/dL   Calcium 8.2 (*) 8.4 - 10.5 mg/dL   GFR calc non Af Amer 84 (*) >90 mL/min   GFR calc Af Amer >90  >90 mL/min   No results found.  Assessment/Plan: Diagnosis: left femoral neck fx 1. Does the need for close, 24 hr/day medical supervision in concert with the patient's rehab needs make it unreasonable for this patient to be served in a less intensive setting? Potentially 2. Co-Morbidities requiring supervision/potential complications: cad, htn 3. Due to bladder management, bowel management, safety, skin/wound care, disease management and medication administration, does the patient require 24 hr/day rehab nursing? No 4. Does the patient require coordinated care of a physician, rehab nurse, PT, OT to address physical and functional deficits in the context of the above medical diagnosis(es)? No Addressing deficits in the following areas: balance, endurance, locomotion, strength, transferring, bathing, dressing, feeding and toileting 5. Can the patient actively participate in an intensive therapy program of at least 3 hrs of therapy per day at least 5 days per week? Potentially 6. The potential for patient to make measurable gains while on inpatient rehab is good and fair 7. Anticipated functional outcomes upon discharge from inpatient rehab are min assist with PT, min to mod assist with OT, n/a with SLP. 8. Estimated rehab length of stay to reach the above functional goals is: 2-3 weeks 9. Does the patient have adequate social supports to accommodate these discharge functional goals? No 10. Anticipated D/C setting: other 11. Anticipated post D/C treatments: HH therapy 12. Overall Rehab/Functional Prognosis: good  RECOMMENDATIONS: This patient's condition is appropriate for continued rehabilitative care  in the following setting: SNF Patient has agreed to participate in recommended program. Yes Note that insurance prior authorization may be required for reimbursement for recommended care.  Comment: Spoke to the pt, wife, son. He does NOT have the social supports to provide for likely care needs after an inpatient rehab admission. SNF is most appropriate venue for ongoing therapy.   Lamonte Richer.  Riley Kill, MD, [facility] Physical Medicine & Rehabilitation     07/22/2013

## 2013-07-22 NOTE — Progress Notes (Signed)
Physical Therapy Treatment Patient Details Name: Andre Mueller MRN: [id number] DOB: 05/02/1921 Today's Date: 07/22/2013 Time: 0865-7846 PT Time Calculation (min): 16 min  PT Assessment / Plan / Recommendation  History of Present Illness s/p ORIF for hip fx LLE; WBAT   PT Comments   Patient progressing slowly. Better with transfers today but still limited with ambulation due to pain and inability to tolerate weight through his L LE. Patient very sweet and wants to walk, he just states "its hard and hurts". Continue to recommend comprehensive inpatient rehab (CIR) for post-acute therapy needs.   Follow Up Recommendations  CIR     Does the patient have the potential to tolerate intense rehabilitation     Barriers to Discharge        Equipment Recommendations  Rolling walker with 5" wheels;3in1 (PT)    Recommendations for Other Services    Frequency Min 6X/week   Progress towards PT Goals Progress towards PT goals: Progressing toward goals  Plan Current plan remains appropriate    Precautions / Restrictions Precautions Precautions: Fall Restrictions Weight Bearing Restrictions: Yes LLE Weight Bearing: Weight bearing as tolerated   Pertinent Vitals/Pain 8/10 L hip pain. patient repositioned for comfort     Mobility  Bed Mobility Bed Mobility: Not assessed Details for Bed Mobility Assistance: Patient on [facility] with entering Transfers Sit to Stand: 3: Mod assist;With armrests;From chair/3-in-1 Stand to Sit: 4: Min assist;With upper extremity assist;With armrests;To chair/3-in-1 Details for Transfer Assistance: Cues for technique, hand placement and prepositioning LLE for comfort; physical assist to control descent Ambulation/Gait Ambulation/Gait Assistance: 1: +2 Total assist Ambulation/Gait: Patient Percentage: 70% Ambulation Distance (Feet): 4 Feet Assistive device: Rolling walker Ambulation/Gait Assistance Details: Patient able to walk from bsc to recliner but refused  further ambulation due to pain and discomfort.  Gait Pattern: Step-to pattern    Exercises     PT Diagnosis:    PT Problem List:   PT Treatment Interventions:     PT Goals (current goals can now be found in the care plan section)    Visit Information  Last PT Received On: 07/22/13 Assistance Needed: +1 History of Present Illness: s/p ORIF for hip fx LLE; WBAT    Subjective Data      Cognition  Cognition Arousal/Alertness: Awake/alert Behavior During Therapy: WFL for tasks assessed/performed Overall Cognitive Status: Within Functional Limits for tasks assessed    Balance     End of Session PT - End of Session Equipment Utilized During Treatment: Gait belt Activity Tolerance: Patient tolerated treatment well;Patient limited by pain Patient left: in chair;with call bell/phone within reach Nurse Communication: Mobility status   GP     Fredrich Birks 07/22/2013, 12:07 PM  07/22/2013 Fredrich Birks PTA [phone] pager [phone] office

## 2013-07-22 NOTE — Progress Notes (Signed)
Patient most appropriate for SNF level rehab. Not a candidate for inpt rehab admission. [phone]

## 2013-07-22 NOTE — Progress Notes (Signed)
Andre Mueller  MRN: [id number] DOB/Age: 1921-07-15 79 y.o. Physician: Andre Mueller, M.D. 3 Days Post-Op Procedure(s) (LRB): OPEN REDUCTION INTERNAL FIXATION HIP (Left)  Subjective: Sitting in chair at bedside, c/o moderate left hip discomfort. Vital Signs Temp:  [97.6 F (36.4 C)-98.3 F (36.8 C)] 97.7 F (36.5 C) (09/24 0543) Pulse Rate:  [74-78] 74 (09/24 0543) Resp:  [16-20] 18 (09/24 1159) BP: (109-137)/(47-56) 137/56 mmHg (09/24 0543) SpO2:  [96 %-98 %] 98 % (09/24 1159)  Lab Results  Recent Labs  07/21/13 0855 07/22/13 0855  WBC 8.0 5.9  HGB 9.8* 8.5*  HCT 28.2* 24.4*  PLT 101* 116*   BMET  Recent Labs  07/21/13 0855 07/22/13 0855  NA 130* 132*  K 3.7 3.3*  CL 96 100  CO2 20 24  GLUCOSE 128* 124*  BUN 20 17  CREATININE 0.77 0.65  CALCIUM 8.5 8.2*   INR  Date Value Range Status  07/20/2013 1.25  0.00 - 1.49 Final     Exam  Left thigh with diffuse swelling, soft. dried blood on dressings, grossly n/v intact distally  Plan Mobilize with PT, SW consult for d/c planning  Andre Mueller M 07/22/2013, 12:22 PM

## 2013-07-23 ENCOUNTER — Other Ambulatory Visit: Payer: Medicare Other

## 2013-07-23 MED ORDER — LORAZEPAM 0.5 MG PO TABS: 0.5000 mg | ORAL_TABLET | Freq: Two times a day (BID) | ORAL | Status: DC | PRN

## 2013-07-23 MED ORDER — SORBITOL 70 % SOLN
960.0000 mL | TOPICAL_OIL | Freq: Once | ORAL | Status: AC
  Administered 2013-07-23: 960 mL via RECTAL
  Filled 2013-07-23: qty 240

## 2013-07-23 MED ORDER — HYDROCODONE-ACETAMINOPHEN 5-325 MG PO TABS: 1.0000 | ORAL_TABLET | Freq: Four times a day (QID) | ORAL | Status: DC | PRN

## 2013-07-23 MED ORDER — POLYETHYLENE GLYCOL 3350 17 G PO PACK: 17.0000 g | PACK | Freq: Three times a day (TID) | ORAL | Status: DC

## 2013-07-23 MED ORDER — ONDANSETRON HCL 4 MG PO TABS: 4.0000 mg | ORAL_TABLET | Freq: Four times a day (QID) | ORAL | Status: DC | PRN

## 2013-07-23 NOTE — Discharge Summary (Signed)
PATIENT DETAILS Name: Andre Mueller Age: 79 y.o. Sex: male Date of Birth: December 04, 1920 MRN: [id number]. Admit Date: 07/19/2013 Admitting Physician: Andre Blue, DO HQI:ONGEXBM Andre Coyer, MD  Recommendations for Outpatient Follow-up:  1. Currently off all antihypertensives, resume when able.  2. Please monitor CBC and chemistries periodically-suggested repeating one next week  3. Need to followup with Dr. Rennis Mueller- orthopedics  PRIMARY DISCHARGE DIAGNOSIS:  Principal Problem:   Hip fracture, left Active Problems:   Hypertension   Hyperlipidemia   CAD (coronary artery disease)      PAST MEDICAL HISTORY: Past Medical History  Diagnosis Date  . Hypercholesteremia   . Anxiety   . GERD (gastroesophageal reflux disease)   . Glaucoma   . Esophageal tear 1990's?  . Hypertension   . Constipation   . Cancer     hx of waldrens disease  . Benign neoplasm of colon 02/2012    polyps x 2 adenomatous w/o high grade dysplasia  . CAD (coronary artery disease) 2007    s/p CABG in [place]  . Diverticulosis of colon (without mention of hemorrhage)     hx LGIB, 04/2012 hosp  . History of resection of small bowel   . Osteoporosis, senile     hx T11 compression fx 02/2012, s/p KP    DISCHARGE MEDICATIONS:   Medication List    STOP taking these medications       bisoprolol-hydrochlorothiazide 5-6.25 MG per tablet  Commonly known as:  ZIAC     lisinopril 40 MG tablet  Commonly known as:  PRINIVIL,ZESTRIL     OVER THE COUNTER MEDICATION     OVER THE COUNTER MEDICATION      TAKE these medications       acetaminophen 500 MG tablet  Commonly known as:  TYLENOL  Take 1 tablet (500 mg total) by mouth every 6 (six) hours as needed for pain.     aspirin EC 325 MG tablet  Take 1 tablet (325 mg total) by mouth daily.     fluticasone 50 MCG/ACT nasal spray  Commonly known as:  FLONASE  Place 1 spray into the nose daily as needed. allergies     HYDROcodone-acetaminophen 5-325 MG per  tablet  Commonly known as:  NORCO/VICODIN  Take 1-2 tablets by mouth every 6 (six) hours as needed.     LORazepam 0.5 MG tablet  Commonly known as:  ATIVAN  Take 1-2 tablets (0.5-1 mg total) by mouth 2 (two) times daily as needed for anxiety.     multivitamin with minerals Tabs tablet  Take 1 tablet by mouth daily.     nitroGLYCERIN 0.4 MG SL tablet  Commonly known as:  NITROSTAT  Place 0.4 mg under the tongue every 5 (five) minutes as needed. Chest pain     omeprazole 20 MG capsule  Commonly known as:  PRILOSEC  Take 20 mg by mouth daily.     ondansetron 4 MG tablet  Commonly known as:  ZOFRAN  Take 1 tablet (4 mg total) by mouth every 6 (six) hours as needed for nausea.     polyethylene glycol packet  Commonly known as:  MIRALAX / GLYCOLAX  Take 17 g by mouth 3 (three) times daily.     senna 8.6 MG Tabs tablet  Commonly known as:  SENOKOT  Take 2 tablets by mouth 2 (two) times daily.     simvastatin 10 MG tablet  Commonly known as:  ZOCOR  Take 10 mg by mouth daily.  ALLERGIES:   Allergies  Allergen Reactions  . Penicillins Swelling    BRIEF HPI:  See H&P, Labs, Consult and Test reports for all details in brief, patient is a 79 year old male with a history of coronary artery disease status post CABG, hypertension presented to the emergency room on 9/21 for a fall. He was found to have a left nondisplaced intertrochanteric fracture of the proximal left femur. He was then admitted for further evaluation and treatment .  CONSULTATIONS:   orthopedic surgery  PERTINENT RADIOLOGIC STUDIES: Dg Chest 1 View  07/19/2013   CLINICAL DATA:  Coronary artery disease. Status post fall.  EXAM: CHEST - 1 VIEW  COMPARISON:  None.  FINDINGS: Previous median sternotomy and CABG procedure. The lung volumes appear slightly diminished and there is atelectasis in the left base. No airspace consolidation identified. No displaced rib fractures identified.  IMPRESSION: 1. Decreased  lung volumes and left base atelectasis.  2. Previous CABG.   Electronically Signed   By: Andre Mueller M.D.   On: 07/19/2013 17:12   Dg Hip Complete Left  07/19/2013   CLINICAL DATA:  Left hip pain  EXAM: LEFT HIP - COMPLETE 2+ VIEW  COMPARISON:  None  FINDINGS: There is a lucency which extends through the intertrochanteric region of the proximal left femur. This is suspicious for a nondisplaced proximal femur fracture. No dislocation identified. The right hip appears normal.  IMPRESSION: 1. Suspect nondisplaced intertrochanteric fracture of the proximal left femur.   Electronically Signed   By: Andre Mueller M.D.   On: 07/19/2013 17:16   Dg Hip Operative Left  07/19/2013   CLINICAL DATA:  Left hip fracture. Intra medullary nail fixation.  EXAM: OPERATIVE LEFT HIP  COMPARISON:  Left hip radiographs same date.  FINDINGS: 4 spot fluoroscopic images demonstrate left femoral neck dynamic screw and intramedullary nail fixation of the nondisplaced intertrochanteric fracture. The hardware appears well positioned. No complications are demonstrated. There are vascular surgical clips medially in the left 5.  IMPRESSION: Fixation of left intertrochanteric fracture without demonstrated complication.   Electronically Signed   By: Andre Mueller   On: 07/19/2013 23:15   Dg Swallowing Func-speech Pathology  07/09/2013   Andre Mueller, CCC-SLP     07/09/2013  2:29 PM Objective Swallowing Evaluation: Modified Barium Swallowing Study   Patient Details  Name: Andre Mueller MRN: [id number] Date of Birth: 09-23-1921  Today's Date: 07/09/2013 Time: 1020-1100 SLP Time Calculation (min): 40 min  Past Medical History:  Past Medical History  Diagnosis Date  . Hypercholesteremia   . Anxiety   . GERD (gastroesophageal reflux disease)   . Glaucoma   . Esophageal tear 1990's?  . Hypertension   . Constipation   . Cancer     hx of waldrens disease  . Benign neoplasm of colon 02/2012    polyps x 2 adenomatous w/o high grade dysplasia   . CAD (coronary artery disease) 2007    s/p CABG in [place]  . Diverticulosis of colon (without mention of hemorrhage)     hx LGIB, 04/2012 hosp  . History of resection of small bowel   . Osteoporosis, senile     hx T11 compression fx 02/2012, s/p KP   Past Surgical History:  Past Surgical History  Procedure Laterality Date  . Cardiac surgery    . Appendectomy    . Hernia repair    . Colonoscopy  02/29/2012    Procedure: COLONOSCOPY;  Surgeon: Hart Carwin, MD;  Location:  [facility];  Service: Endoscopy;  Laterality: N/A;  . Esophagus surgery      due to esophageal tear  . Small intestine surgery      20 yrs ago; in [place]  . Coronary artery bypass graft      > 6 yrs  . Kyphosis surgery     HPI:  Pt is an 79 year old male, living in Independent Living, arriving  for an outpatient MBS recommended by Dr. Dulce Sellar of [facility]. Pt  reports frequent productive coughing though he feels this is  related to sinus drainage. He denis globus or regurgitation and  says coughing is not necessarily related to eating. He wife  however reports that pts will often have hard coughing at the end  of a meal, expectorating large quantities of foamy mucous though  it doesnt ahve food in it. Pt denies any recent pna, but does  report his esophagus burst requiring surgery. No history of CVA  and no dx of GERD per pt     Assessment / Plan / Recommendation Clinical Impression  Dysphagia Diagnosis: Suspected primary esophageal  dysphagia;Moderate pharyngeal phase dysphagia Clinical impression: Pt presents with a moderate pharyngeal  dysphagia as well as concern for a significant esophageal  impairment. Pt observed to orally manipulate POs well, though  there is a slight delay in airway protection with thin liquids  leading to trace silent penetration to the cords in 50% of  trials. A chin tuck worsened aspiration, but a cued throat clear  removed most penetrate/aspirate. In between swallows with thin  and nectar thick, barium on cords  increased though residuals were  trace to mild and no significant pharyngeal weakness noted.  Question if pt producing copious secretions which were mixing  with barium and contacting cords? Also concerned about secretions  as pt began drooling during session, suspect waterbrash.   Appearance of significant stasis in mid to distal esophagus.  Barium tablet also appeared to lodge at GE junction despite  efforts to transit. Believe pts compaint of coughing at end of  meal and expectoration of mucous likely more related to poor  transit of solid POs through esophagus. Hopeful that Dr. Dulce Sellar  will f/u with pt on esophgeal function. For now, SLP made  recommendations to pt to continue current diet with a volitional  throat clear after every few sips to expel aspirate and also  educated pt on esophageal precautions and strategies. Aspiration  risk is moderate, but thickened liquids will not reduce risk.  Perhaps treatment of esophageal concerns may improve pharyngeal  function.     Treatment Recommendation  No treatment recommended at this time    Diet Recommendation Regular;Thin liquid   Liquid Administration via: Cup Medication Administration: Crushed with puree Supervision: Patient able to self feed;Intermittent supervision  to cue for compensatory strategies Compensations: Slow rate;Small sips/bites;Clear throat  intermittently;Follow solids with liquid Postural Changes and/or Swallow Maneuvers: Seated upright 90  degrees;Upright 30-60 min after meal    Other  Recommendations Recommended Consults: Consider esophageal  assessment;Consider GI evaluation Oral Care Recommendations: Patient independent with oral care   Follow Up Recommendations  None (Pt may benefit from f/u SLP after GI visit)    Frequency and Duration        Pertinent Vitals/Pain NA    SLP Swallow Goals     General HPI: Pt is an 79 year old male, living in Independent  Living, arriving for an outpatient MBS recommended by Dr. Dulce Sellar  of [facility].  Pt  reports frequent productive coughing though he  feels this is related to sinus drainage. He denis globus or  regurgitation and says coughing is not necessarily related to  eating. He wife however reports that pts will often have hard  coughing at the end of a meal, expectorating large quantities of  foamy mucous though it doesnt ahve food in it. Pt denies any  recent pna, but does report his esophagus burst requiring  surgery. No history of CVA and no dx of GERD per pt Type of Study: Modified Barium Swallowing Study Reason for Referral: Objectively evaluate swallowing function Diet Prior to this Study: Regular;Thin liquids Temperature Spikes Noted: No Respiratory Status: Room air History of Recent Intubation: No Behavior/Cognition: Alert;Cooperative;Pleasant mood Oral Cavity - Dentition: Dentures, top;Dentures, bottom Oral Motor / Sensory Function: Within functional limits Self-Feeding Abilities: Able to feed self Patient Positioning: Upright in chair Baseline Vocal Quality: Clear Volitional Cough: Strong Volitional Swallow: Able to elicit Anatomy: Within functional limits Pharyngeal Secretions: Not observed secondary MBS    Reason for Referral Objectively evaluate swallowing function   Oral Phase Oral Preparation/Oral Phase Oral Phase: WFL   Pharyngeal Phase Pharyngeal Phase Pharyngeal Phase: Impaired Pharyngeal - Nectar Pharyngeal - Nectar Cup: Delayed swallow initiation;Pharyngeal  residue - valleculae;Pharyngeal residue - pyriform  sinuses;Pharyngeal residue - posterior  pharnyx;Penetration/Aspiration after swallow (minimal residuals) Penetration/Aspiration details (nectar cup): Material enters  airway, passes BELOW cords without attempt by patient to eject  out (silent aspiration);Material does not enter airway Pharyngeal - Thin Pharyngeal - Thin Cup: Delayed swallow initiation;Reduced  airway/laryngeal closure;Penetration/Aspiration before  swallow;Penetration/Aspiration after swallow;Moderate   aspiration;Trace aspiration;Pharyngeal residue -  valleculae;Pharyngeal residue - pyriform sinuses;Pharyngeal  residue - posterior pharnyx Penetration/Aspiration details (thin cup): Material enters  airway, CONTACTS cords and not ejected out;Material enters  airway, passes BELOW cords without attempt by patient to eject  out (silent aspiration);Material does not enter airway Pharyngeal - Thin Straw: Delayed swallow initiation;Reduced  airway/laryngeal closure;Penetration/Aspiration before  swallow;Penetration/Aspiration after swallow;Moderate  aspiration;Trace aspiration;Pharyngeal residue -  valleculae;Pharyngeal residue - pyriform sinuses;Pharyngeal  residue - posterior pharnyx Penetration/Aspiration details (thin straw): Material does not  enter airway;Material enters airway, CONTACTS cords and not  ejected out;Material enters airway, passes BELOW cords without  attempt by patient to eject out (silent aspiration) Pharyngeal - Solids Pharyngeal - Puree: Within functional limits Pharyngeal - Regular: Within functional limits Pharyngeal - Pill: Delayed swallow initiation  Cervical Esophageal Phase    GO    Cervical Esophageal Phase Cervical Esophageal Phase: Impaired Cervical Esophageal Phase - Comment Cervical Esophageal Comment: see impression statement    Functional Assessment Tool Used: clinical judgement Functional Limitations: Swallowing Swallow Current Status (Z6109): At least 40 percent but less than  60 percent impaired, limited or restricted Swallow Goal Status [phone]): At least 40 percent but less than 60  percent impaired, limited or restricted Swallow Discharge Status [phone]): At least 40 percent but less  than 60 percent impaired, limited or restricted   Harlon Ditty, MA CCC-SLP [phone]   Claudine Mouton 07/09/2013, 2:26 PM      PERTINENT LAB RESULTS: CBC:  Recent Labs  07/21/13 0855 07/22/13 0855  WBC 8.0 5.9  HGB 9.8* 8.5*  HCT 28.2* 24.4*  PLT 101* 116*   CMET CMP      Component Value Date/Time   NA 132* 07/22/2013 0855   K 3.3* 07/22/2013 0855   CL 100 07/22/2013 0855   CO2 24 07/22/2013 0855   GLUCOSE 124* 07/22/2013 0855   BUN  17 07/22/2013 0855   CREATININE 0.65 07/22/2013 0855   CALCIUM 8.2* 07/22/2013 0855   PROT 7.7 07/20/2013 0635   ALBUMIN 2.9* 07/20/2013 0635   AST 17 07/20/2013 0635   ALT 11 07/20/2013 0635   ALKPHOS 54 07/20/2013 0635   BILITOT 1.1 07/20/2013 0635   GFRNONAA 84* 07/22/2013 0855   GFRAA >90 07/22/2013 0855    GFR Estimated Creatinine Clearance: 64.6 ml/min (by C-G formula based on Cr of 0.65). No results found for this basename: LIPASE, AMYLASE,  in the last 72 hours No results found for this basename: CKTOTAL, CKMB, CKMBINDEX, TROPONINI,  in the last 72 hours No components found with this basename: POCBNP,  No results found for this basename: DDIMER,  in the last 72 hours No results found for this basename: HGBA1C,  in the last 72 hours No results found for this basename: CHOL, HDL, LDLCALC, TRIG, CHOLHDL, LDLDIRECT,  in the last 72 hours No results found for this basename: TSH, T4TOTAL, FREET3, T3FREE, THYROIDAB,  in the last 72 hours No results found for this basename: VITAMINB12, FOLATE, FERRITIN, TIBC, IRON, RETICCTPCT,  in the last 72 hours Coags: No results found for this basename: PT, INR,  in the last 72 hours Microbiology: Recent Results (from the past 240 hour(s))  URINE CULTURE     Status: None   Collection Time    07/20/13  3:25 PM      Result Value Range Status   Specimen Description URINE, CATHETERIZED   Final   Special Requests NONE   Final   Culture  Setup Time     Final   Value: 07/20/2013 16:00     Performed at [facility] Count     Final   Value: NO GROWTH     Performed at [facility]   Culture     Final   Value: NO GROWTH     Performed at [facility]   Report Status 07/21/2013 FINAL   Final     BRIEF HOSPITAL COURSE:   Principal Problem: Hip fracture, left   - Status post left hip open reduction and internal fixation- done on 9/21 - Postoperative course relatively stable, with complications involving mild acute blood loss anemia (not requiring PRBC transfusion) secondary to perioperative loss and constipation. - Has been seen by physical therapy services, also seen by acute rehab MD, current recommendations are to transfer to skilled nursing facility for rehabilitation. Patient and family willing, we'll transfer to skilled nursing facility when bed available. - Per orthopedics, place on aspirin for DVT prophylaxis, he also has a history of CAD. Please change to 81 mg of aspirin after 3-4 weeks.  Active Problems: Anemia  - Secondary to perioperative blood loss  - Monitor H&H while at the skilled nursing facility, last hemoglobin on 9/24 was 8.5. Suggest repeat CBC in one week  Mild thrombocytopenia -? Etiology - Last platelet count was 116 on 9/24 - His recheck CBC in one week  Hypertension  - BP currently controlled without antihypertensive medications, postoperative course complicated by brief hypotension that has responded to fluids  - Resume antihypertensive medications when possible   Dyslipidemia  - Continue with statin   History of CAD  - Status post CABG in 2007  - On 325 mg of aspirin currently, changed to 81 mg brown 2-4 weeks' time - Continue with statins  - Resume beta blocker when able  Constipation  - .Scheduled MiraLax and Senokot   TODAY-DAY  OF DISCHARGE:  Subjective:   Jene Oravec today has no headache,no chest abdominal pain,no new weakness tingling or numbness, feels much better wants to go home today.   Objective:   Blood pressure 137/62, pulse 85, temperature 98.4 F (36.9 C), temperature source Oral, resp. rate 18, height 5\' 10"  (1.778 m), weight 73.5 kg (162 lb 0.6 oz), SpO2 98.00%.  Intake/Output Summary (Last 24 hours) at 07/23/13 0914 Last data filed at 07/23/13 0430  Gross per 24 hour  Intake       0 ml  Output    100 ml  Net   -100 ml   Filed Weights   07/19/13 1556 07/19/13 2345  Weight: 72.576 kg (160 lb) 73.5 kg (162 lb 0.6 oz)    Exam Awake Alert, Oriented *3, No new F.N deficits, Normal affect [place].AT,PERRAL Supple Neck,No JVD, No cervical lymphadenopathy appriciated.  Symmetrical Chest wall movement, Good air movement bilaterally, CTAB RRR,No Gallops,Rubs or new Murmurs, No Parasternal Heave +ve B.Sounds, Abd Soft, Non tender, No organomegaly appriciated, No rebound -guarding or rigidity. No Cyanosis, Clubbing or edema, No new Rash or bruise  DISCHARGE CONDITION: Stable  DISPOSITION: SNF  DISCHARGE INSTRUCTIONS:    Activity:  As tolerated with Full fall precautions use walker/cane & assistance as needed  Diet recommendation: Heart Healthy diet       Discharge Orders   Future Orders Complete By Expires   Call MD for:  redness, tenderness, or signs of infection (pain, swelling, redness, odor or green/yellow discharge around incision site)  As directed    Diet - low sodium heart healthy  As directed    Full weight bearing  As directed    Increase activity slowly  As directed       Follow-up Information   Follow up with Senaida Lange, MD. Schedule an appointment as soon as possible for a visit in 2 weeks.   Specialty:  Orthopedic Surgery   Contact information:   [address] [place] [address] [phone]       Follow up with Rene Paci, MD. Schedule an appointment as soon as possible for a visit in 2 weeks.   Specialty:  Internal Medicine   Contact information:   [address] [place] [address] [phone]      Total Time spent on discharge equals 45 minutes.  SignedJeoffrey Massed 07/23/2013 9:14 AM

## 2013-07-23 NOTE — Progress Notes (Signed)
Physical Therapy Treatment Patient Details Name: Andre Mueller MRN: [id number] DOB: 11/22/1920 Today's Date: 07/23/2013 Time: 2130-8657 PT Time Calculation (min): 24 min  PT Assessment / Plan / Recommendation  History of Present Illness s/p ORIF for hip fx LLE; WBAT   PT Comments   Patient making great progression today with mobility. Patient able to ambulate to doorway and stand with more ease. Added therex this session. Patient did not get approved for inpatient rehab so will need to pursue SNF for continued therapies prior to returning home.   Follow Up Recommendations  SNF     Does the patient have the potential to tolerate intense rehabilitation     Barriers to Discharge        Equipment Recommendations  Rolling walker with 5" wheels;3in1 (PT)    Recommendations for Other Services    Frequency Min 6X/week   Progress towards PT Goals Progress towards PT goals: Progressing toward goals  Plan Discharge plan needs to be updated   Precautions / Restrictions Precautions Precautions: Fall Restrictions Weight Bearing Restrictions: Yes LLE Weight Bearing: Weight bearing as tolerated   Pertinent Vitals/Pain no apparent distress     Mobility  Bed Mobility Supine to Sit: 3: Mod assist Sitting - Scoot to Edge of Bed: 4: Min guard Details for Bed Mobility Assistance: A for LEs and trunk control Transfers Sit to Stand: 3: Mod assist;With armrests;From bed;With upper extremity assist Stand to Sit: 4: Min assist;With upper extremity assist;With armrests;To chair/3-in-1 Details for Transfer Assistance: Cues for technique, hand placement and prepositioning LLE for comfort; physical assist to control descent Ambulation/Gait Ambulation/Gait Assistance: 3: Mod assist Ambulation Distance (Feet): 15 Feet Assistive device: Rolling walker Ambulation/Gait Assistance Details: A for RW positioning and to support balance as patient with slight posterior lean. Cues for upright posture and  sequency Gait Pattern: Step-through pattern;Decreased stride length Gait velocity: decreased    Exercises Total Joint Exercises Heel Slides: AAROM;Left;10 reps Hip ABduction/ADduction: AAROM;Left;10 reps   PT Diagnosis:    PT Problem List:   PT Treatment Interventions:     PT Goals (current goals can now be found in the care plan section)    Visit Information  Last PT Received On: 07/23/13 Assistance Needed: +1 History of Present Illness: s/p ORIF for hip fx LLE; WBAT    Subjective Data      Cognition  Cognition Arousal/Alertness: Awake/alert Behavior During Therapy: WFL for tasks assessed/performed Overall Cognitive Status: Within Functional Limits for tasks assessed    Balance     End of Session PT - End of Session Equipment Utilized During Treatment: Gait belt Activity Tolerance: Patient tolerated treatment well Patient left: in chair;with call bell/phone within reach Nurse Communication: Mobility status   GP     Fredrich Birks 07/23/2013, 11:04 AM 07/23/2013 Fredrich Birks PTA [phone] pager [phone] office

## 2013-07-23 NOTE — Progress Notes (Signed)
Agree with change in D/C disposition due to insurance approval. Shelva Majestic [place], [place] [phone]

## 2013-07-24 NOTE — Clinical Social Work Psychosocial (Signed)
Clinical Social Work Department  BRIEF PSYCHOSOCIAL ASSESSMENT  Patient: Deloris Mittag  Account Number: [id number]   Admit date: 07/19/13 Clinical Social Worker Sabino Niemann, MSW Date/Time: 07/22/13 Referred by: Physician Date Referred: 07/22/13 Referred for   SNF Placement   Other Referral:  Interview type: Patient and patient's wife Other interview type: PSYCHOSOCIAL DATA  Living Status: Wife at [facility] Admitted from facility: [facility] Level of care: Independent Living Primary support name: Cha Gomillion Primary support relationship to patient: Wife Degree of support available:  Strong and vested  CURRENT CONCERNS  Current Concerns   Post-Acute Placement   Other Concerns:  SOCIAL WORK ASSESSMENT / PLAN  CSW met with pt re: PT recommendation for SNF.   Pt lives at [facility] with his spouse  CSW explained placement process and answered questions.   Pt reports no preference   CSW completed FL2 and initiated SNF search.     Assessment/plan status: Information/Referral to [facility]  Other assessment/ plan:  Information/referral to community resources:  SNF   PTAR  [name]/FAMILY'S RESPONSE TO PLAN OF CARE:  Pt  reports he is agreeable to [facility] SNF in order to increase strength and independence with mobility prior to returning home  Pt verbalized understanding of placement process and appreciation for CSW assist.   Sabino Niemann, MSW [phone]

## 2013-07-24 NOTE — Progress Notes (Signed)
Clinical social worker assisted with patient discharge to skilled nursing facility, [facility].  CSW addressed all family questions and concerns. CSW copied chart and added all important documents. CSW also set up patient transportation with [facility]. Clinical Social Worker will sign off for now as social work intervention is no longer needed.   Hutch Rhett, MSW, LCSWA [phone] 

## 2013-10-01 IMAGING — CR DG ABDOMEN ACUTE W/ 1V CHEST
4 series · 4 of 4 positions shown · non-contrast
Comparison: 02/14/2012

CLINICAL DATA: Constipation and upper abdominal pain.

ACUTE ABDOMEN SERIES (ABDOMEN 2 VIEW & CHEST 1 VIEW)

[w chest pa]
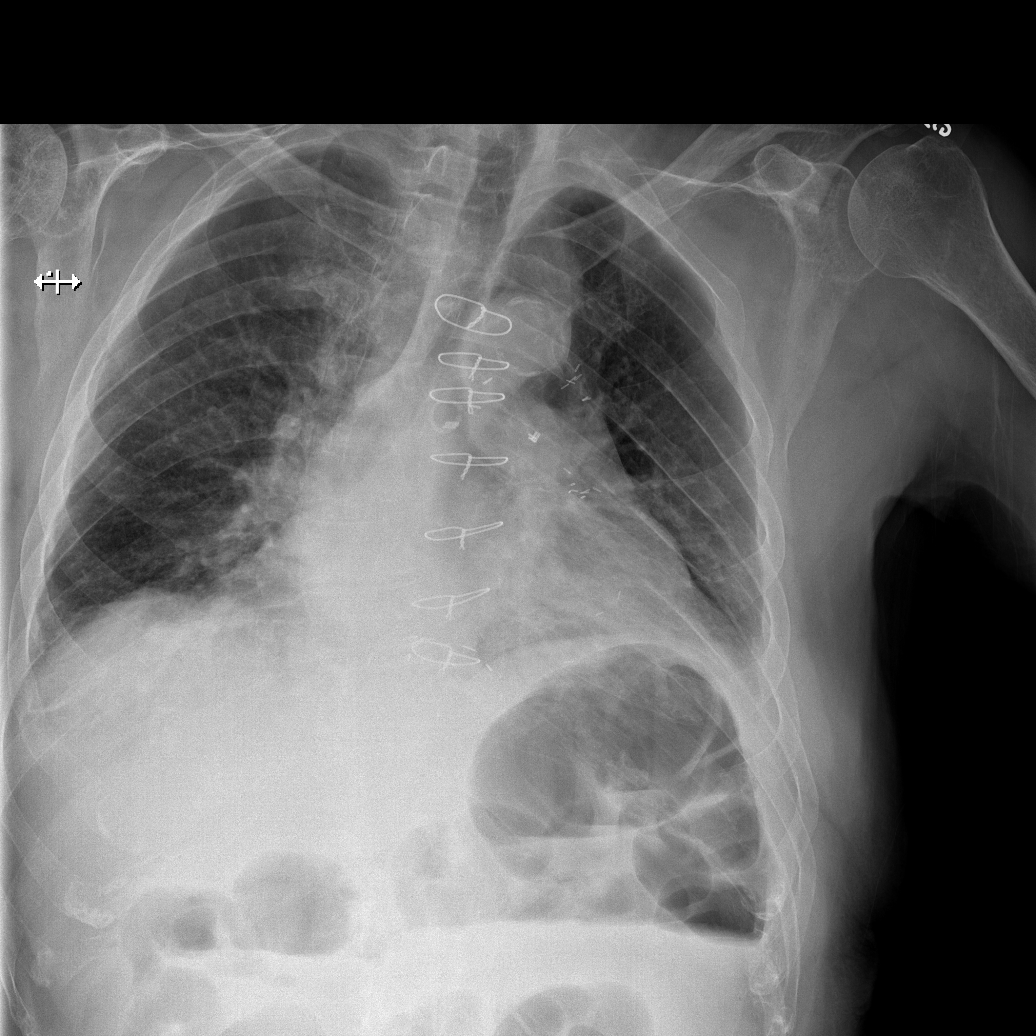

[w abdomen upright]
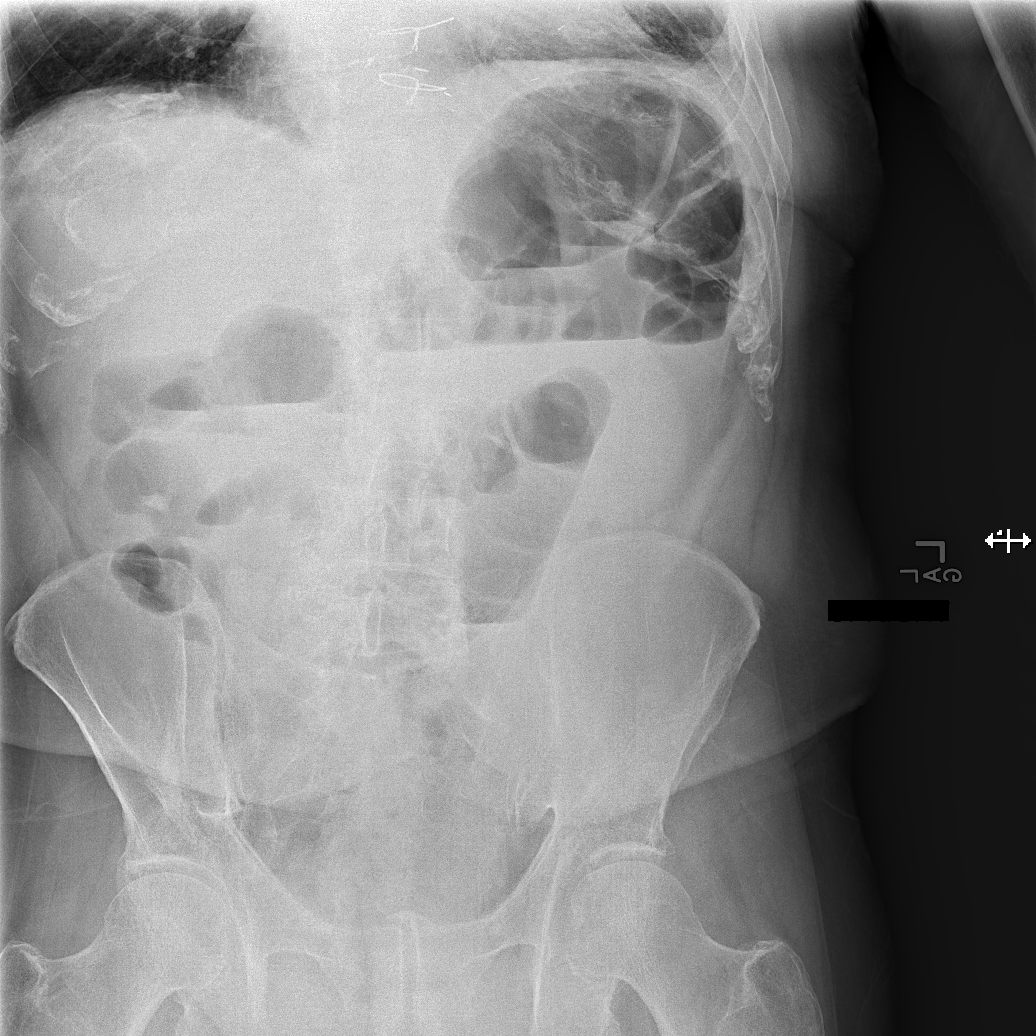

[x abdomen supine (1 of 2)]
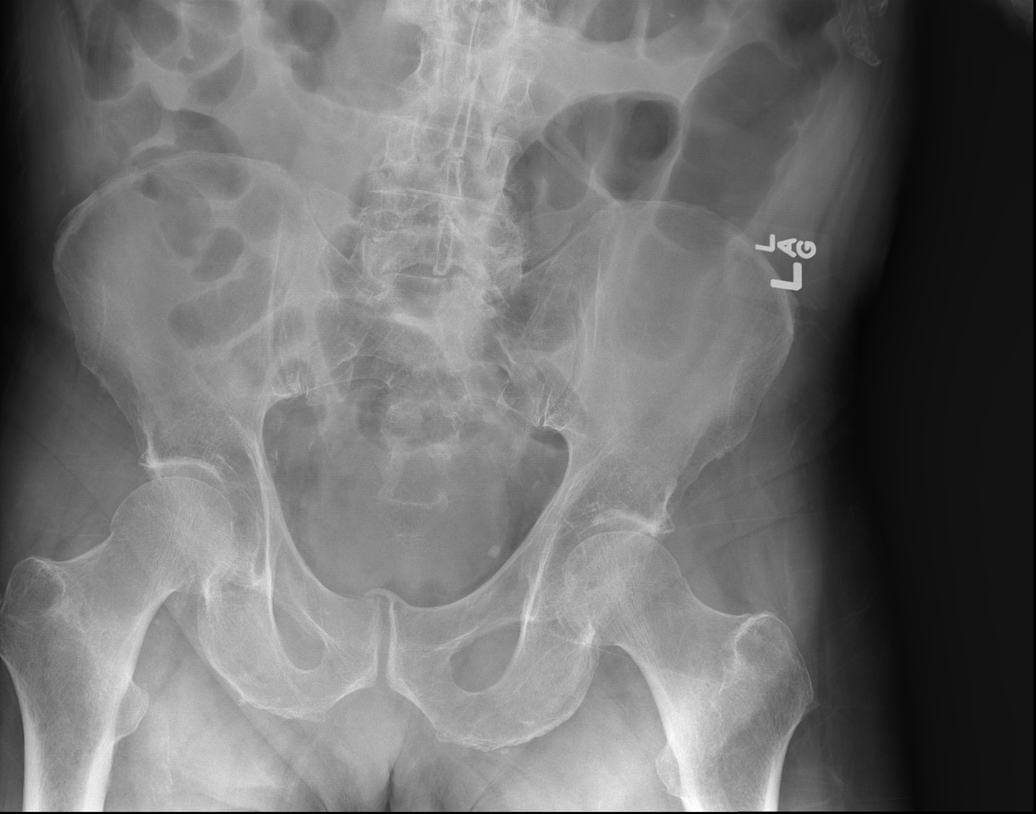

[x abdomen supine (2 of 2)]
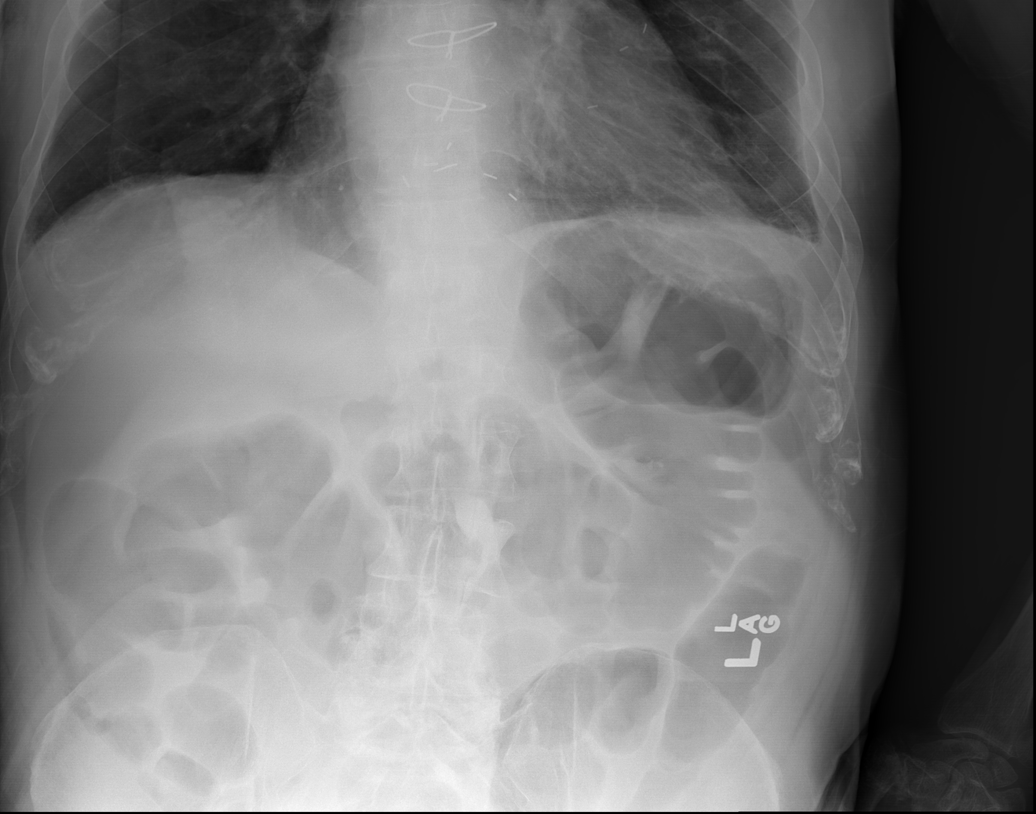

[4 of 4 positions shown; findings below may reference images not displayed]

FINDINGS: Stable postoperative changes in the mediastinum.  Cardiac
enlargement with normal pulmonary vascularity.  Probable
atelectasis in the lung bases.  No pneumothorax.  No blunting of
costophrenic angles.  Calcification of the aorta.

Mild gaseous distension of the colon with air-fluid levels
consistent with liquid stool.  Changes are most consistent with
ileus. Low colonic obstruction is not excluded.  Stable appearance
since previous study.  No free intra-abdominal air.  Degenerative
changes in the lumbar spine and hips.
IMPRESSION: Mild gaseous distension of the colon with liquid stool most
consistent with ileus.  Stable appearance since previous study.
Atelectasis in the lung bases.

## 2013-10-20 ENCOUNTER — Ambulatory Visit
Admission: RE | Admit: 2013-10-20 | Discharge: 2013-10-20 | Disposition: A | Discharge status: Home / Self Care | Payer: Medicare Other | Source: Ambulatory Visit | Attending: Gastroenterology | Admitting: Gastroenterology

## 2013-10-20 DIAGNOSIS — R1310 Dysphagia, unspecified: Secondary | ICD-10-CM

## 2013-10-25 IMAGING — RF DG THORACIC SPINE 2V
1 series · 1 of 1 positions shown · non-contrast
Comparison: CT lumbar spine [DATE]/ 7819

CLINICAL DATA: Back pain

THORACIC SPINE - 2 VIEW

[Series 1: run · 1 of 1 slices shown]
[im 1/1]
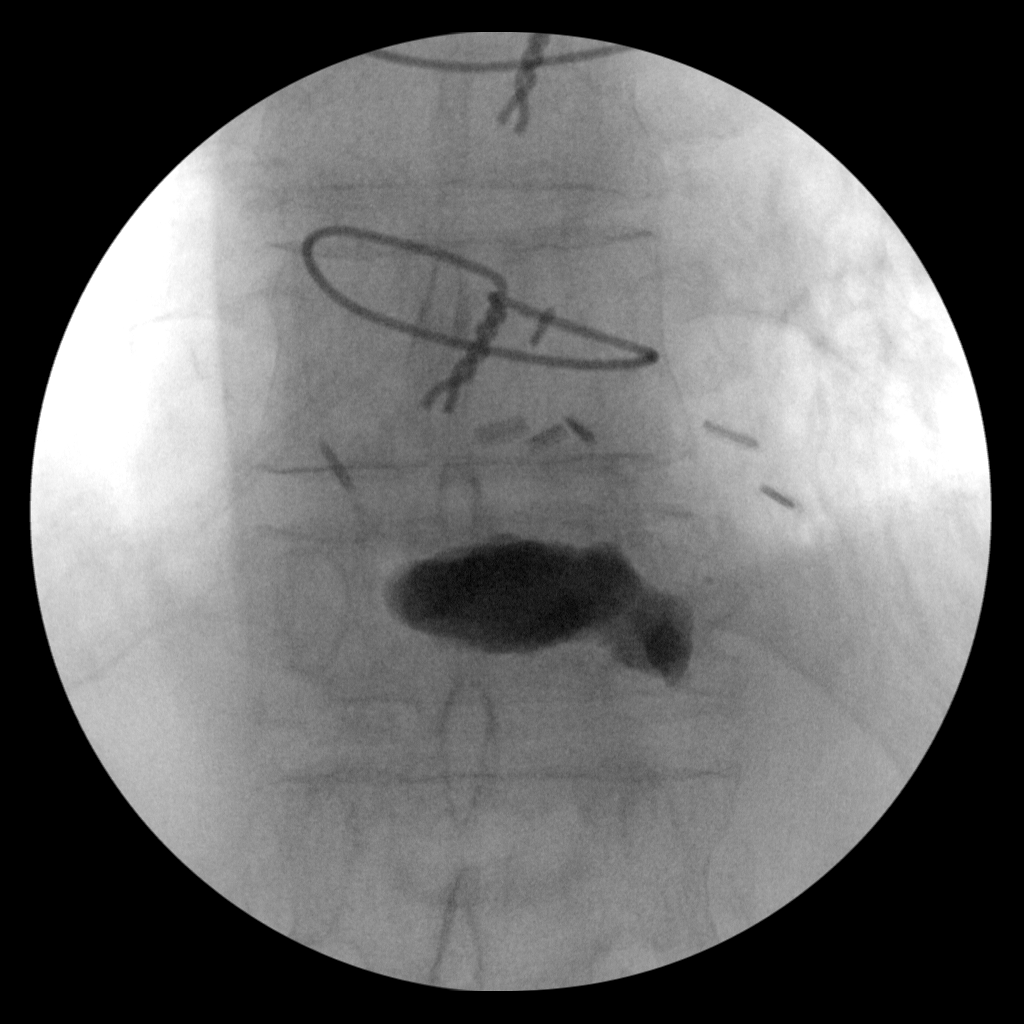

[1 of 1 positions shown; findings below may reference images not displayed]

FINDINGS: The patient is status post T11 kyphoplasty.  No gross
adverse features.
IMPRESSION: As above.

## 2014-04-29 ENCOUNTER — Ambulatory Visit (INDEPENDENT_AMBULATORY_CARE_PROVIDER_SITE_OTHER): Payer: Medicare Other | Admitting: Podiatrist

## 2014-04-29 ENCOUNTER — Encounter: Payer: Self-pay | Admitting: Podiatrist

## 2014-04-29 VITALS — BP 157/86 | HR 70 | Resp 17 | Ht 70.0 in | Wt 150.0 lb

## 2014-04-29 DIAGNOSIS — B351 Tinea unguium: Secondary | ICD-10-CM

## 2014-04-29 DIAGNOSIS — M79609 Pain in unspecified limb: Secondary | ICD-10-CM

## 2014-04-29 DIAGNOSIS — M79673 Pain in unspecified foot: Secondary | ICD-10-CM

## 2014-04-29 NOTE — Progress Notes (Signed)
   Subjective:    Patient ID: Andre Mueller, male    DOB: Sep 27, 1921, 80 y.o.   MRN: [id number]  HPI Comments: Pt presents with his wife for debridement of 10 toenails.     Review of Systems  All other systems reviewed and are negative.      Objective:   Physical Exam  Patient is awake, alert, and oriented x 3.  In no acute distress.  Vascular status is intact with palpable pedal pulses at 2/4 DP and PT bilateral and capillary refill time within normal limits. Neurological sensation is also intact bilaterally via Semmes Weinstein monofilament at 5/5 sites. Light touch, vibratory sensation, Achilles tendon reflex is intact. Dermatological exam reveals skin color, turger and texture as normal. No open lesions present.  Musculature intact with dorsiflexion, plantarflexion, inversion, eversion. Pes planus foot type is noted. [name] toenails are thickened, discolored, dystrophic, clinically mycotic and symptomatic bilateral.    Assessment & Plan:  Symptomatic mycotic toenails bilateral  Plan: Debridement of the toenails was accomplished today without complication. He'll be seen back in 3 months or as needed for followup. He'll call if any problems arise in the meantime.

## 2014-07-01 ENCOUNTER — Ambulatory Visit: Payer: Medicare Other | Admitting: Podiatrist

## 2014-07-23 ENCOUNTER — Ambulatory Visit: Payer: Medicare Other | Admitting: Podiatrist

## 2014-08-13 ENCOUNTER — Encounter: Payer: Self-pay | Admitting: Podiatrist

## 2014-08-13 ENCOUNTER — Ambulatory Visit (INDEPENDENT_AMBULATORY_CARE_PROVIDER_SITE_OTHER): Payer: Medicare Other | Admitting: Podiatrist

## 2014-08-13 DIAGNOSIS — B351 Tinea unguium: Secondary | ICD-10-CM

## 2014-08-13 DIAGNOSIS — M79676 Pain in unspecified toe(s): Secondary | ICD-10-CM

## 2014-08-16 NOTE — Progress Notes (Signed)
HPI:  Patient presents today for follow up of foot and nail care. Denies any new complaints today.  Objective:  Patients chart is reviewed.  Vascular status reveals pedal pulses noted at 2 out of 4 dp and pt bilateral .  Neurological sensation is intact to Semmes Weinstein monofilament bilateral.  Patients nails are thickened, discolored, distrophic, friable and brittle with yellow-brown discoloration. Patient subjectively relates they are painful with shoes and with ambulation of bilateral feet.  Assessment:  Symptomatic onychomycosis  Plan:  Discussed treatment options and alternatives.  The symptomatic toenails were debrided through manual an mechanical means without complication.  Return appointment recommended at routine intervals of 3 months       

## 2014-11-17 ENCOUNTER — Ambulatory Visit
Admission: RE | Admit: 2014-11-17 | Discharge: 2014-11-17 | Disposition: A | Discharge status: Home / Self Care | Payer: Medicare Other | Source: Ambulatory Visit | Attending: Family Medicine | Admitting: Family Medicine

## 2014-11-17 ENCOUNTER — Other Ambulatory Visit: Payer: Self-pay | Admitting: Family Medicine

## 2014-11-17 DIAGNOSIS — R059 Cough, unspecified: Secondary | ICD-10-CM

## 2014-11-17 DIAGNOSIS — R05 Cough: Secondary | ICD-10-CM

## 2014-12-10 ENCOUNTER — Ambulatory Visit: Payer: Medicare Other | Admitting: Podiatrist

## 2014-12-17 ENCOUNTER — Encounter: Payer: Self-pay | Admitting: Podiatrist

## 2014-12-17 ENCOUNTER — Encounter (INDEPENDENT_AMBULATORY_CARE_PROVIDER_SITE_OTHER): Payer: Medicare Other | Admitting: Podiatrist

## 2014-12-17 DIAGNOSIS — M79676 Pain in unspecified toe(s): Secondary | ICD-10-CM

## 2014-12-17 DIAGNOSIS — B351 Tinea unguium: Secondary | ICD-10-CM

## 2014-12-17 NOTE — Progress Notes (Signed)
This encounter was created in error - please disregard.

## 2015-01-12 ENCOUNTER — Emergency Department (HOSPITAL_COMMUNITY): Payer: Medicare Other

## 2015-01-12 ENCOUNTER — Emergency Department (HOSPITAL_COMMUNITY)
Admission: EM | Admit: 2015-01-12 | Discharge: 2015-01-12 | Disposition: A | Discharge status: Home / Self Care | Payer: Medicare Other | Attending: Emergency Medicine | Admitting: Emergency Medicine

## 2015-01-12 ENCOUNTER — Encounter (HOSPITAL_COMMUNITY): Payer: Self-pay | Admitting: Family Medicine

## 2015-01-12 DIAGNOSIS — F419 Anxiety disorder, unspecified: Secondary | ICD-10-CM | POA: Diagnosis not present

## 2015-01-12 DIAGNOSIS — Z7951 Long term (current) use of inhaled steroids: Secondary | ICD-10-CM | POA: Diagnosis not present

## 2015-01-12 DIAGNOSIS — I2510 Atherosclerotic heart disease of native coronary artery without angina pectoris: Secondary | ICD-10-CM | POA: Insufficient documentation

## 2015-01-12 DIAGNOSIS — Z7982 Long term (current) use of aspirin: Secondary | ICD-10-CM | POA: Insufficient documentation

## 2015-01-12 DIAGNOSIS — Z87891 Personal history of nicotine dependence: Secondary | ICD-10-CM | POA: Insufficient documentation

## 2015-01-12 DIAGNOSIS — J40 Bronchitis, not specified as acute or chronic: Secondary | ICD-10-CM | POA: Insufficient documentation

## 2015-01-12 DIAGNOSIS — Z87828 Personal history of other (healed) physical injury and trauma: Secondary | ICD-10-CM | POA: Insufficient documentation

## 2015-01-12 DIAGNOSIS — I10 Essential (primary) hypertension: Secondary | ICD-10-CM | POA: Diagnosis not present

## 2015-01-12 DIAGNOSIS — Z880 Allergy status to penicillin: Secondary | ICD-10-CM | POA: Diagnosis not present

## 2015-01-12 DIAGNOSIS — Z79899 Other long term (current) drug therapy: Secondary | ICD-10-CM | POA: Insufficient documentation

## 2015-01-12 DIAGNOSIS — M810 Age-related osteoporosis without current pathological fracture: Secondary | ICD-10-CM | POA: Insufficient documentation

## 2015-01-12 DIAGNOSIS — Z9889 Other specified postprocedural states: Secondary | ICD-10-CM | POA: Diagnosis not present

## 2015-01-12 DIAGNOSIS — Z86018 Personal history of other benign neoplasm: Secondary | ICD-10-CM | POA: Diagnosis not present

## 2015-01-12 DIAGNOSIS — K5900 Constipation, unspecified: Secondary | ICD-10-CM | POA: Diagnosis not present

## 2015-01-12 DIAGNOSIS — E780 Pure hypercholesterolemia: Secondary | ICD-10-CM | POA: Insufficient documentation

## 2015-01-12 DIAGNOSIS — K219 Gastro-esophageal reflux disease without esophagitis: Secondary | ICD-10-CM | POA: Insufficient documentation

## 2015-01-12 DIAGNOSIS — R05 Cough: Secondary | ICD-10-CM | POA: Diagnosis present

## 2015-01-12 DIAGNOSIS — R059 Cough, unspecified: Secondary | ICD-10-CM

## 2015-01-12 LAB — BASIC METABOLIC PANEL
Anion gap: 9 (ref 5–15)
BUN: 18 mg/dL (ref 6–23)
CO2: 25 mmol/L (ref 19–32)
Calcium: 9.2 mg/dL (ref 8.4–10.5)
Chloride: 99 mmol/L (ref 96–112)
Creatinine, Ser: 0.75 mg/dL (ref 0.50–1.35)
GFR calc Af Amer: >90 mL/min (ref >90)
GFR calc non Af Amer: 78 mL/min — ABNORMAL LOW (ref >90)
Glucose, Bld: 124 mg/dL — ABNORMAL HIGH (ref 70–99)
POTASSIUM: 3.8 mmol/L (ref 3.5–5.1)
SODIUM: 133 mmol/L — AB (ref 135–145)

## 2015-01-12 LAB — CBC
HCT: 33.9 % — ABNORMAL LOW (ref 39.0–52.0)
Hemoglobin: 11.6 g/dL — ABNORMAL LOW (ref 13.0–17.0)
MCH: 34.2 pg — AB (ref 26.0–34.0)
MCHC: 34.2 g/dL (ref 30.0–36.0)
MCV: 100 fL (ref 78.0–100.0)
Platelets: 126 10*3/uL — ABNORMAL LOW (ref 150–400)
RBC: 3.39 MIL/uL — ABNORMAL LOW (ref 4.22–5.81)
RDW: 14.1 % (ref 11.5–15.5)
WBC: 7.7 10*3/uL (ref 4.0–10.5)

## 2015-01-12 MED ORDER — DOXYCYCLINE HYCLATE 100 MG PO TABS
100.0000 mg | ORAL_TABLET | Freq: Once | ORAL | Status: AC
  Administered 2015-01-12: 100 mg via ORAL
  Filled 2015-01-12: qty 1

## 2015-01-12 MED ORDER — ALBUTEROL SULFATE HFA 108 (90 BASE) MCG/ACT IN AERS
2.0000 | INHALATION_SPRAY | Freq: Once | RESPIRATORY_TRACT | Status: AC
  Administered 2015-01-12: 2 via RESPIRATORY_TRACT
  Filled 2015-01-12: qty 6.7

## 2015-01-12 MED ORDER — GUAIFENESIN ER 600 MG PO TB12: 600.0000 mg | ORAL_TABLET | Freq: Two times a day (BID) | ORAL | Status: AC

## 2015-01-12 MED ORDER — DOXYCYCLINE HYCLATE 100 MG PO CAPS: 100.0000 mg | ORAL_CAPSULE | Freq: Two times a day (BID) | ORAL | Status: DC

## 2015-01-12 NOTE — ED Provider Notes (Signed)
CSN: [id number]     Arrival date & time 01/12/15  1802 History   First MD Initiated Contact with Patient 01/12/15 2120     Chief Complaint  Patient presents with  . Cough     (Consider location/radiation/quality/duration/timing/severity/associated sxs/prior Treatment) Patient is a 81 y.o. male presenting with cough. The history is provided by the patient. No language interpreter was used.  Cough Cough characteristics:  Productive Sputum characteristics:  White Severity:  Moderate Onset quality:  Gradual Duration:  5 weeks Timing:  Constant Chronicity:  New Smoker: no   Context: not animal exposure, not exposure to allergens, not fumes, not occupational exposure, not sick contacts, not smoke exposure, not upper respiratory infection, not weather changes and not with activity   Relieved by:  Nothing Worsened by:  Nothing tried Ineffective treatments: antibiotics (he cannot recall the name) Associated symptoms: no chest pain, no chills, no fever, no headaches and no shortness of breath   Risk factors: no chemical exposure, no recent infection and no recent travel     Past Medical History  Diagnosis Date  . Hypercholesteremia   . Anxiety   . GERD (gastroesophageal reflux disease)   . Glaucoma   . Esophageal tear 1990's?  . Hypertension   . Constipation   . Cancer     hx of waldrens disease  . Benign neoplasm of colon 02/2012    polyps x 2 adenomatous w/o high grade dysplasia  . CAD (coronary artery disease) 2007    s/p CABG in [place]  . Diverticulosis of colon (without mention of hemorrhage)     hx LGIB, 04/2012 hosp  . History of resection of small bowel   . Osteoporosis, senile     hx T11 compression fx 02/2012, s/p KP   Past Surgical History  Procedure Laterality Date  . Cardiac surgery    . Appendectomy    . Hernia repair    . Colonoscopy  02/29/2012    Procedure: COLONOSCOPY;  Surgeon: Lafayette Dragon, MD;  Location: [facility];  Service: Endoscopy;  Laterality:  N/A;  . Esophagus surgery      due to esophageal tear  . Small intestine surgery      20 yrs ago; in [place]  . Coronary artery bypass graft      > 6 yrs  . Kyphosis surgery    . Orif hip fracture Left 07/19/2013    Procedure: OPEN REDUCTION INTERNAL FIXATION HIP;  Surgeon: Marin Shutter, MD;  Location: [facility];  Service: Orthopedics;  Laterality: Left;   Family History  Problem Relation Age of Onset  . Colon cancer Neg Hx   . Malignant hyperthermia Neg Hx    History  Substance Use Topics  . Smoking status: Former Smoker    Quit date: 09/19/1962  . 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 Alcohol Use: No    Review of Systems  Constitutional: Negative for fever, chills and fatigue.  HENT: Negative for trouble swallowing.   Eyes: Negative for visual disturbance.  Respiratory: Positive for cough. Negative for shortness of breath.   Cardiovascular: Negative for chest pain and palpitations.  Gastrointestinal: Negative for nausea, vomiting, abdominal pain and diarrhea.  Genitourinary: Negative for dysuria and difficulty urinating.  Musculoskeletal: Negative for  arthralgias and neck pain.  Skin: Negative for color change.  Neurological: Negative for dizziness, weakness and headaches.  Psychiatric/Behavioral: Negative for dysphoric mood.      Allergies  Penicillins  Home Medications   Prior to Admission medications   Medication Sig Start Date End Date Taking? Authorizing Provider  aspirin EC 81 MG tablet Take 81 mg by mouth daily.   Yes Historical Provider, MD  benzonatate (TESSALON) 100 MG capsule Take 100 mg by mouth 3 (three) times daily.  12/16/14  Yes Historical Provider, MD  bisoprolol-hydrochlorothiazide (ZIAC) 5-6.25 MG per tablet Take 1 tablet by mouth daily.    Yes Historical Provider, MD  fluticasone (FLONASE) 50 MCG/ACT nasal spray Place 1 spray into the nose at bedtime. allergies 02/04/12  Yes Historical Provider, MD  LORazepam (ATIVAN) 0.5 MG tablet Take 0.5-1 mg by mouth 2 (two) times daily.  12/15/14  Yes Historical Provider, MD  Multiple Vitamin (MULITIVITAMIN WITH MINERALS) TABS Take 1 tablet by mouth daily.   Yes Historical Provider, MD  nitroGLYCERIN (NITROSTAT) 0.4 MG SL tablet Place 0.4 mg under the tongue every 5 (five) minutes as needed for chest pain.    Yes Historical Provider, MD  omeprazole (PRILOSEC) 20 MG capsule Take 20 mg by mouth daily.   Yes Historical Provider, MD  OVER THE COUNTER MEDICATION Take 1 tablet by mouth daily as needed (nausea/vomiting). OTC nausea medicine   Yes Historical Provider, MD  Polyethyl Glycol-Propyl Glycol 0.4-0.3 % GEL Place 1 application into both eyes at bedtime.   Yes Historical Provider, MD  polyethylene glycol (MIRALAX / GLYCOLAX) packet Take 17 g by mouth 3 (three) times daily. Patient taking differently: Take 17 g by mouth at bedtime.  07/23/13  Yes Shanker Kristeen Mans, MD  Propylene Glycol 0.6 % SOLN Place 1 drop into both eyes 3 (three) times daily.   Yes Historical Provider, MD  senna (SENOKOT) 8.6 MG TABS Take 2-3 tablets by mouth daily.    Yes Historical Provider, MD  simvastatin (ZOCOR) 10 MG tablet Take 10 mg by mouth daily.   Yes Historical Provider, MD  traZODone (DESYREL) 100 MG tablet Take 200-300 mg by mouth at bedtime.  12/15/14  Yes Historical Provider, MD  aspirin EC 325 MG tablet Take 1 tablet (325 mg total) by mouth daily. Patient not taking: Reported on 01/12/2015 07/22/13   Justice Britain, MD   BP 149/72 mmHg  Pulse 71  Temp(Src) 98.7 F (37.1 C) (Oral)  Resp 22  SpO2 98% Physical Exam  Constitutional: He is oriented to person, place, and time. He appears well-developed and well-nourished. No distress.  HENT:  Head: Normocephalic and atraumatic.  Eyes: Conjunctivae and EOM are  normal.  Neck: Normal range of motion.  Cardiovascular: Normal rate and regular rhythm.  Exam reveals no gallop and no friction rub.   No murmur heard. Bilateral lower extremity 2+ pitting edema  Pulmonary/Chest: Effort normal. He has wheezes. He has no rales. He exhibits no tenderness.  Wheezing noted in all lung fields   Abdominal: Soft. He exhibits no distension. There is no tenderness. There is no rebound.  Musculoskeletal: Normal range of motion.  Neurological: He is alert and oriented to person, place, and time. Coordination normal.  Speech is goal-oriented. Moves limbs without ataxia.   Skin: Skin is warm and dry.  Psychiatric: He has a normal mood and affect. His behavior is normal.  Nursing note and vitals reviewed.   ED Course  Procedures (including critical care time) Labs Review Labs  Reviewed  CBC - Abnormal; Notable for the following:    RBC 3.39 (*)    Hemoglobin 11.6 (*)    HCT 33.9 (*)    MCH 34.2 (*)    Platelets 126 (*)    All other components within normal limits  BASIC METABOLIC PANEL - Abnormal; Notable for the following:    Sodium 133 (*)    Glucose, Bld 124 (*)    GFR calc non Af Amer 78 (*)    All other components within normal limits    Imaging Review Dg Chest 2 View  01/12/2015   CLINICAL DATA:  One month history of cough.  EXAM: CHEST  2 VIEW  COMPARISON:  November 17, 2014  FINDINGS: There is no edema or consolidation. There is underlying emphysematous change. There is calcification along the right hemidiaphragm.  Heart size is within normal limits. Pulmonary vascularity is normal. Patient is status post coronary artery bypass grafting. No adenopathy. Bones are osteoporotic with several lower thoracic wedge compression fractures. Patient has undergone a kyphoplasty procedure for 1 of these lower thoracic compression fractures.  IMPRESSION: Underlying emphysematous change. No edema or consolidation. Evidence of previous asbestos exposure, characterized by  calcification along the right hemidiaphragm.   Electronically Signed   By: Lowella Grip III M.D.   On: 01/12/2015 20:03     EKG Interpretation None      MDM   Final diagnoses:  Bronchitis    9:50 PM Labs unremarkable for acute changes. Chest xray shows emphysematous changes. Patient likely has bronchitis. Patient will treated with doxycycline, mucinex and albuterol inhaler. Vitals stable and patient afebrile. Patient is able to ambulate and maintain oxygen saturation in the hight 90's. Dr. Eulis Foster saw the patient and agrees with the plan.    Alvina Chou, PA-C 01/12/15 [place], MD 01/13/15 [phone]

## 2015-01-12 NOTE — Discharge Instructions (Signed)
Take doxycycline as directed until gone. Use inhaler as needed. Take Mucinex for congestion. Refer to attached documents for more information. Return to the ED with worsening or concerning symptoms.

## 2015-01-12 NOTE — ED Provider Notes (Signed)
  Face-to-face evaluation   History: He complains of cough occasionally productive of white sputum for several weeks. His doctor gave him some kind of medicine, but he cannot recall what it was. He feels like he is wheezing. He denies chest pain, weakness or dizziness.  Physical exam: Alert, calm, cooperative, elderly man. Lungs somewhat decreased air movement bilaterally with generalized expiratory wheezing. No increased work of breathing. Legs with mild peripheral edema.  Assessment- evaluation is consistent with bronchitis. Doubt pneumonia, PE, or serious bacterial infection.  Recommended treatment- antibiotic, prednisone, inhaler. Recommended follow up PCP, one week.  Medical screening examination/treatment/procedure(s) were conducted as a shared visit with non-physician practitioner(s) and myself.  I personally evaluated the patient during the encounter   Daleen Bo, MD 01/13/15 [phone]

## 2015-01-12 NOTE — ED Notes (Signed)
Pt O2 sat 95-99% while ambulating.

## 2015-01-12 NOTE — ED Notes (Signed)
Pt here for cough x 1 month. sts that he has been coughing up phlegm.

## 2015-02-11 ENCOUNTER — Ambulatory Visit: Payer: Medicare Other | Admitting: Podiatrist

## 2015-08-24 ENCOUNTER — Encounter (HOSPITAL_COMMUNITY): Payer: Self-pay | Admitting: Emergency Medicine

## 2015-08-24 ENCOUNTER — Emergency Department (HOSPITAL_COMMUNITY): Payer: Medicare Other

## 2015-08-24 ENCOUNTER — Emergency Department (HOSPITAL_COMMUNITY)
Admission: EM | Admit: 2015-08-24 | Discharge: 2015-08-24 | Disposition: A | Discharge status: Home / Self Care | Payer: Medicare Other | Attending: Emergency Medicine | Admitting: Emergency Medicine

## 2015-08-24 DIAGNOSIS — I10 Essential (primary) hypertension: Secondary | ICD-10-CM | POA: Insufficient documentation

## 2015-08-24 DIAGNOSIS — Z880 Allergy status to penicillin: Secondary | ICD-10-CM | POA: Insufficient documentation

## 2015-08-24 DIAGNOSIS — Z87891 Personal history of nicotine dependence: Secondary | ICD-10-CM | POA: Insufficient documentation

## 2015-08-24 DIAGNOSIS — Z79899 Other long term (current) drug therapy: Secondary | ICD-10-CM | POA: Diagnosis not present

## 2015-08-24 DIAGNOSIS — Z859 Personal history of malignant neoplasm, unspecified: Secondary | ICD-10-CM | POA: Diagnosis not present

## 2015-08-24 DIAGNOSIS — Z7951 Long term (current) use of inhaled steroids: Secondary | ICD-10-CM | POA: Insufficient documentation

## 2015-08-24 DIAGNOSIS — E782 Mixed hyperlipidemia: Secondary | ICD-10-CM | POA: Insufficient documentation

## 2015-08-24 DIAGNOSIS — K4090 Unilateral inguinal hernia, without obstruction or gangrene, not specified as recurrent: Secondary | ICD-10-CM | POA: Diagnosis not present

## 2015-08-24 DIAGNOSIS — Z7982 Long term (current) use of aspirin: Secondary | ICD-10-CM | POA: Insufficient documentation

## 2015-08-24 DIAGNOSIS — K219 Gastro-esophageal reflux disease without esophagitis: Secondary | ICD-10-CM | POA: Diagnosis not present

## 2015-08-24 DIAGNOSIS — Z86018 Personal history of other benign neoplasm: Secondary | ICD-10-CM | POA: Insufficient documentation

## 2015-08-24 DIAGNOSIS — R1030 Lower abdominal pain, unspecified: Secondary | ICD-10-CM | POA: Diagnosis present

## 2015-08-24 DIAGNOSIS — F419 Anxiety disorder, unspecified: Secondary | ICD-10-CM | POA: Diagnosis not present

## 2015-08-24 DIAGNOSIS — I2510 Atherosclerotic heart disease of native coronary artery without angina pectoris: Secondary | ICD-10-CM | POA: Insufficient documentation

## 2015-08-24 DIAGNOSIS — R52 Pain, unspecified: Secondary | ICD-10-CM

## 2015-08-24 LAB — COMPREHENSIVE METABOLIC PANEL
ALT: 12 U/L — ABNORMAL LOW (ref 17–63)
ANION GAP: 15 (ref 5–15)
AST: 19 U/L (ref 15–41)
Albumin: 2.9 g/dL — ABNORMAL LOW (ref 3.5–5.0)
Alkaline Phosphatase: 52 U/L (ref 38–126)
BILIRUBIN TOTAL: 1.3 mg/dL — AB (ref 0.3–1.2)
BUN: 16 mg/dL (ref 6–20)
CHLORIDE: 96 mmol/L — AB (ref 101–111)
CO2: 23 mmol/L (ref 22–32)
Calcium: 8.6 mg/dL — ABNORMAL LOW (ref 8.9–10.3)
Creatinine, Ser: 0.68 mg/dL (ref 0.61–1.24)
Glucose, Bld: 120 mg/dL — ABNORMAL HIGH (ref 65–99)
POTASSIUM: 3.6 mmol/L (ref 3.5–5.1)
Sodium: 134 mmol/L — ABNORMAL LOW (ref 135–145)
TOTAL PROTEIN: 8.7 g/dL — AB (ref 6.5–8.1)

## 2015-08-24 LAB — CBC WITH DIFFERENTIAL/PLATELET
Basophils Absolute: 0 10*3/uL (ref 0.0–0.1)
Basophils Relative: 0 %
Eosinophils Absolute: 0 10*3/uL (ref 0.0–0.7)
Eosinophils Relative: 0 %
HEMATOCRIT: 33.9 % — AB (ref 39.0–52.0)
Hemoglobin: 11.6 g/dL — ABNORMAL LOW (ref 13.0–17.0)
LYMPHS PCT: 19 %
Lymphs Abs: 1.4 10*3/uL (ref 0.7–4.0)
MCH: 33.6 pg (ref 26.0–34.0)
MCHC: 34.2 g/dL (ref 30.0–36.0)
MCV: 98.3 fL (ref 78.0–100.0)
MONO ABS: 1 10*3/uL (ref 0.1–1.0)
MONOS PCT: 15 %
NEUTROS ABS: 4.7 10*3/uL (ref 1.7–7.7)
Neutrophils Relative %: 66 %
PLATELETS: 124 10*3/uL — AB (ref 150–400)
RBC: 3.45 MIL/uL — ABNORMAL LOW (ref 4.22–5.81)
RDW: 13.4 % (ref 11.5–15.5)
WBC: 7.1 10*3/uL (ref 4.0–10.5)

## 2015-08-24 LAB — URINALYSIS, ROUTINE W REFLEX MICROSCOPIC
Bilirubin Urine: NEGATIVE
GLUCOSE, UA: NEGATIVE mg/dL
Hgb urine dipstick: NEGATIVE
KETONES UR: 15 mg/dL — AB
Leukocytes, UA: NEGATIVE
NITRITE: NEGATIVE
Protein, ur: NEGATIVE mg/dL
Specific Gravity, Urine: 1.014 (ref 1.005–1.030)
Urobilinogen, UA: 1 mg/dL (ref 0.0–1.0)
pH: 7 (ref 5.0–8.0)

## 2015-08-24 MED ORDER — ONDANSETRON 4 MG PO TBDP: ORAL_TABLET | ORAL | Status: AC

## 2015-08-24 MED ORDER — HYDROCODONE-ACETAMINOPHEN 5-325 MG PO TABS: 1.0000 | ORAL_TABLET | Freq: Four times a day (QID) | ORAL | Status: DC | PRN

## 2015-08-24 NOTE — ED Notes (Signed)
Pt reports groin pain for few days on right sided flank and radiating into groin; cardiac hx. EMT said heart was irregular; has some PVCs. A&Ox3. Lives at [facility]. No chest pain.

## 2015-08-24 NOTE — Discharge Instructions (Signed)
Follow up with your family md next week. °

## 2015-08-24 NOTE — ED Notes (Signed)
Pt. Given crackers and ice water

## 2015-08-24 NOTE — ED Provider Notes (Signed)
CSN: [id number]     Arrival date & time 08/24/15  1227 History   First MD Initiated Contact with Patient 08/24/15 1231     Chief Complaint  Patient presents with  . Groin Pain     (Consider location/radiation/quality/duration/timing/severity/associated sxs/prior Treatment) Patient is a 81 y.o. male presenting with groin pain. The history is provided by the patient (Patient complains of right lower quadrant pain and some pain in his back. He has a history of a right inguinal hernia.).  Groin Pain This is a recurrent problem. The current episode started 3 to 5 hours ago. The problem occurs rarely. The problem has been resolved. Associated symptoms include abdominal pain. Pertinent negatives include no chest pain and no headaches. Nothing aggravates the symptoms.    Past Medical History  Diagnosis Date  . Hypercholesteremia   . Anxiety   . GERD (gastroesophageal reflux disease)   . Glaucoma   . Esophageal tear 1990's?  . Hypertension   . Constipation   . Cancer ([place])     hx of waldrens disease  . Benign neoplasm of colon 02/2012    polyps x 2 adenomatous w/o high grade dysplasia  . CAD (coronary artery disease) 2007    s/p CABG in [place]  . Diverticulosis of colon (without mention of hemorrhage)     hx LGIB, 04/2012 hosp  . History of resection of small bowel   . Osteoporosis, senile     hx T11 compression fx 02/2012, s/p KP   Past Surgical History  Procedure Laterality Date  . Cardiac surgery    . Appendectomy    . Hernia repair    . Colonoscopy  02/29/2012    Procedure: COLONOSCOPY;  Surgeon: Lafayette Dragon, MD;  Location: [facility];  Service: Endoscopy;  Laterality: N/A;  . Esophagus surgery      due to esophageal tear  . Small intestine surgery      20 yrs ago; in [place]  . Coronary artery bypass graft      > 6 yrs  . Kyphosis surgery    . Orif hip fracture Left 07/19/2013    Procedure: OPEN REDUCTION INTERNAL FIXATION HIP;  Surgeon: Marin Shutter, MD;  Location:  [facility];  Service: Orthopedics;  Laterality: Left;   Family History  Problem Relation Age of Onset  . Colon cancer Neg Hx   . Malignant hyperthermia Neg Hx    Social History  Substance Use Topics  . Smoking status: Former Smoker    Quit date: 09/19/1962  . 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 Alcohol Use: No    Review of Systems  Constitutional: Negative for appetite change and fatigue.  HENT: Negative for congestion, ear discharge and sinus pressure.   Eyes: Negative for discharge.  Respiratory: Negative for cough.   Cardiovascular: Negative for chest pain.  Gastrointestinal: Positive for abdominal pain. Negative for diarrhea.  Genitourinary: Negative for frequency and hematuria.  Musculoskeletal: Negative for back pain.  Skin: Negative for rash.  Neurological: Negative for seizures and headaches.  Psychiatric/Behavioral: Negative for hallucinations.      Allergies  Penicillins  Home Medications   Prior to Admission medications   Medication Sig Start Date End Date Taking? Authorizing Provider  aspirin EC 81 MG  tablet Take 81 mg by mouth daily.   Yes Historical Provider, MD  benzonatate (TESSALON) 100 MG capsule Take 100 mg by mouth 3 (three) times daily as needed for cough.  12/16/14  Yes Historical Provider, MD  bisoprolol-hydrochlorothiazide (ZIAC) 5-6.25 MG per tablet Take 1 tablet by mouth daily.   Yes Historical Provider, MD  cholecalciferol (VITAMIN D) 1000 UNITS tablet Take 1,000 Units by mouth daily.   Yes Historical Provider, MD  fluticasone (FLONASE) 50 MCG/ACT nasal spray Place 1 spray into the nose daily. allergies 02/04/12  Yes Historical Provider, MD  furosemide (LASIX) 20 MG tablet Take 20 mg by mouth daily as needed for edema.   Yes Historical  Provider, MD  guaiFENesin (MUCINEX) 600 MG 12 hr tablet Take 1 tablet (600 mg total) by mouth 2 (two) times daily. 01/12/15  Yes Kaitlyn Szekalski, PA-C  levothyroxine (SYNTHROID, LEVOTHROID) 25 MCG tablet Take 25 mcg by mouth daily before breakfast.   Yes Historical Provider, MD  LORazepam (ATIVAN) 0.5 MG tablet Take 0.5 mg by mouth 2 (two) times daily as needed for anxiety.  12/15/14  Yes Historical Provider, MD  Multiple Vitamin (MULITIVITAMIN WITH MINERALS) TABS Take 1 tablet by mouth daily.   Yes Historical Provider, MD  nitroGLYCERIN (NITROSTAT) 0.4 MG SL tablet Place 0.4 mg under the tongue every 5 (five) minutes as needed for chest pain.    Yes Historical Provider, MD  omeprazole (PRILOSEC) 20 MG capsule Take 20 mg by mouth daily.   Yes Historical Provider, MD  promethazine (PHENERGAN) 25 MG tablet Take 25 mg by mouth every 6 (six) hours as needed for nausea or vomiting.   Yes Historical Provider, MD  senna (SENOKOT) 8.6 MG TABS Take 3 tablets by mouth at bedtime.    Yes Historical Provider, MD  simvastatin (ZOCOR) 10 MG tablet Take 10 mg by mouth daily.   Yes Historical Provider, MD  traMADol (ULTRAM) 50 MG tablet Take 50 mg by mouth every 6 (six) hours as needed.   Yes Historical Provider, MD  traZODone (DESYREL) 100 MG tablet Take 200 mg by mouth at bedtime as needed for sleep.  12/15/14  Yes Historical Provider, MD  aspirin EC 325 MG tablet Take 1 tablet (325 mg total) by mouth daily. Patient not taking: Reported on 01/12/2015 07/22/13   Justice Britain, MD  HYDROcodone-acetaminophen (NORCO/VICODIN) 5-325 MG tablet Take 1 tablet by mouth every 6 (six) hours as needed for moderate pain. 08/24/15   Milton Ferguson, MD  ondansetron (ZOFRAN ODT) 4 MG disintegrating tablet 4mg  ODT q4 hours prn nausea/vomit 08/24/15   Milton Ferguson, MD  polyethylene glycol [facility] / GLYCOLAX) packet Take 17 g by mouth 3 (three) times daily. Patient not taking: Reported on 08/24/2015 07/23/13   Jonetta Osgood, MD    BP 172/86 mmHg  Pulse 78  Temp(Src) 98.2 F (36.8 C) (Oral)  Resp 16  SpO2 97% Physical Exam  Constitutional: He is oriented to person, place, and time. He appears well-developed.  HENT:  Head: Normocephalic.  Eyes: Conjunctivae and EOM are normal. No scleral icterus.  Neck: Neck supple. No thyromegaly present.  Cardiovascular: Normal rate and regular rhythm.  Exam reveals no gallop and no friction rub.   No murmur heard. Pulmonary/Chest: No stridor. He has no wheezes. He has no rales. He exhibits no tenderness.  Abdominal: He exhibits no distension. There is tenderness. There is no rebound.  Mild tenderness right inguinal area  Musculoskeletal: Normal range of motion. He exhibits no edema.  Lymphadenopathy:  He has no cervical adenopathy.  Neurological: He is oriented to person, place, and time. He exhibits normal muscle tone. Coordination normal.  Skin: No rash noted. No erythema.  Psychiatric: He has a normal mood and affect. His behavior is normal.    ED Course  Procedures (including critical care time) Labs Review Labs Reviewed  CBC WITH DIFFERENTIAL/PLATELET - Abnormal; Notable for the following:    RBC 3.45 (*)    Hemoglobin 11.6 (*)    HCT 33.9 (*)    Platelets 124 (*)    All other components within normal limits  COMPREHENSIVE METABOLIC PANEL - Abnormal; Notable for the following:    Sodium 134 (*)    Chloride 96 (*)    Glucose, Bld 120 (*)    Calcium 8.6 (*)    Total Protein 8.7 (*)    Albumin 2.9 (*)    ALT 12 (*)    Total Bilirubin 1.3 (*)    All other components within normal limits  URINALYSIS, ROUTINE W REFLEX MICROSCOPIC (NOT AT [facility]) - Abnormal; Notable for the following:    Ketones, ur 15 (*)    All other components within normal limits    Imaging Review Ct Renal Stone Study  08/24/2015  CLINICAL DATA:  81 year old male with right side flank pain radiating to the groin for several days. PET-CT EXAM: CT ABDOMEN AND PELVIS WITHOUT CONTRAST  TECHNIQUE: Multidetector CT imaging of the abdomen and pelvis was performed following the standard protocol without IV contrast. COMPARISON:  CT Abdomen and Pelvis 02/18/2012. Lumbar spine CT 03/05/2012. FINDINGS: Sequelae of median sternotomy and CABG. No pericardial effusion. New small layering right pleural effusion. Trace left pleural effusion. Chronic pleural calcification along the right hemidiaphragm. Increased dependent pulmonary opacity more so in the left lung. No lung base air bronchograms. Osteopenia. The T11 compression fracture seen in 2013 has now been been augmented. There is a new mild T12 inferior endplate compression fracture, age indeterminate. Mild to moderate T10 compression fracture also new since 2013. Lower lumbar facet degeneration. Interval left proximal femur ORIF. Other visualized osseous structures appear stable. Chronic right inguinal hernia has progressed since 2013 now measuring up to 10 cm diameter. This contains multiple small bowel loops which do not appear incarcerated. Stool ball in the rectum. No pelvic free fluid. Mild prostate enlargement. Unremarkable urinary bladder. Redundant sigmoid colon with no active inflammation identified. Mild gas and stool distension of the more proximal colon. No pericecal inflammation. Appendix not evident. No dilated small bowel. Decompressed stomach and duodenum. Noncontrast liver, spleen, pancreas, and adrenal glands are stable. The gallbladder is diminutive or absent. Extensive Aortoiliac calcified atherosclerosis noted. No abdominal free fluid or free air. Bilateral renal hilar vascular calcifications. No hydronephrosis or hydroureter. No definite nephrolithiasis. No lymphadenopathy identified. IMPRESSION: 1. Progressed small bowel containing right inguinal hernia since 2013. This does not appear incarcerated currently, and there is no associated bowel obstruction. 2. Age-indeterminate T11 and T12 compression fractures. 3. New small right  pleural effusion. Increased left lower lobe opacity, atelectasis versus infection. 4. Extensive Aortoiliac calcified atherosclerosis. No nephrolithiasis or obstructive uropathy. Electronically Signed   By: Genevie Ann M.D.   On: 08/24/2015 13:49   I have personally reviewed and evaluated these images and lab results as part of my medical decision-making.   EKG Interpretation None      MDM   Final diagnoses:  Pain  Hernia, inguinal, right    Labs including urine are unremarkable. CT shows right inguinal hernia getting bigger.  [name] symptoms have improved. We'll send him home on Vicodin and Zofran. He is follow-up with his PCP. He has seen a surgeon for this already who does not recommend surgery.    Milton Ferguson, MD 08/24/15 [phone]

## 2015-08-24 NOTE — ED Notes (Signed)
Patient denies pain and is resting comfortably.  

## 2015-08-28 ENCOUNTER — Emergency Department (HOSPITAL_COMMUNITY): Payer: Medicare Other

## 2015-08-28 ENCOUNTER — Observation Stay (HOSPITAL_COMMUNITY)
Admission: EM | Admit: 2015-08-28 | Discharge: 2015-09-01 | Disposition: A | Discharge status: Skilled Nursing Facility | Payer: Medicare Other | Attending: Internal Medicine | Admitting: Internal Medicine

## 2015-08-28 ENCOUNTER — Encounter (HOSPITAL_COMMUNITY): Payer: Self-pay

## 2015-08-28 DIAGNOSIS — E860 Dehydration: Secondary | ICD-10-CM | POA: Insufficient documentation

## 2015-08-28 DIAGNOSIS — K648 Other hemorrhoids: Secondary | ICD-10-CM | POA: Insufficient documentation

## 2015-08-28 DIAGNOSIS — J90 Pleural effusion, not elsewhere classified: Secondary | ICD-10-CM | POA: Diagnosis not present

## 2015-08-28 DIAGNOSIS — Z7982 Long term (current) use of aspirin: Secondary | ICD-10-CM | POA: Diagnosis not present

## 2015-08-28 DIAGNOSIS — R627 Adult failure to thrive: Secondary | ICD-10-CM

## 2015-08-28 DIAGNOSIS — G9340 Encephalopathy, unspecified: Secondary | ICD-10-CM | POA: Insufficient documentation

## 2015-08-28 DIAGNOSIS — K5730 Diverticulosis of large intestine without perforation or abscess without bleeding: Secondary | ICD-10-CM | POA: Diagnosis not present

## 2015-08-28 DIAGNOSIS — N400 Enlarged prostate without lower urinary tract symptoms: Secondary | ICD-10-CM | POA: Insufficient documentation

## 2015-08-28 DIAGNOSIS — K4090 Unilateral inguinal hernia, without obstruction or gangrene, not specified as recurrent: Secondary | ICD-10-CM | POA: Insufficient documentation

## 2015-08-28 DIAGNOSIS — Z86010 Personal history of colonic polyps: Secondary | ICD-10-CM | POA: Insufficient documentation

## 2015-08-28 DIAGNOSIS — R112 Nausea with vomiting, unspecified: Secondary | ICD-10-CM | POA: Diagnosis present

## 2015-08-28 DIAGNOSIS — Z880 Allergy status to penicillin: Secondary | ICD-10-CM | POA: Diagnosis not present

## 2015-08-28 DIAGNOSIS — M5136 Other intervertebral disc degeneration, lumbar region: Secondary | ICD-10-CM | POA: Insufficient documentation

## 2015-08-28 DIAGNOSIS — F419 Anxiety disorder, unspecified: Secondary | ICD-10-CM | POA: Insufficient documentation

## 2015-08-28 DIAGNOSIS — M810 Age-related osteoporosis without current pathological fracture: Secondary | ICD-10-CM | POA: Diagnosis not present

## 2015-08-28 DIAGNOSIS — I2510 Atherosclerotic heart disease of native coronary artery without angina pectoris: Secondary | ICD-10-CM | POA: Diagnosis not present

## 2015-08-28 DIAGNOSIS — E7800 Pure hypercholesterolemia, unspecified: Secondary | ICD-10-CM | POA: Insufficient documentation

## 2015-08-28 DIAGNOSIS — K5900 Constipation, unspecified: Secondary | ICD-10-CM | POA: Insufficient documentation

## 2015-08-28 DIAGNOSIS — K219 Gastro-esophageal reflux disease without esophagitis: Secondary | ICD-10-CM | POA: Diagnosis not present

## 2015-08-28 DIAGNOSIS — E876 Hypokalemia: Secondary | ICD-10-CM | POA: Insufficient documentation

## 2015-08-28 DIAGNOSIS — R410 Disorientation, unspecified: Secondary | ICD-10-CM

## 2015-08-28 DIAGNOSIS — K625 Hemorrhage of anus and rectum: Secondary | ICD-10-CM | POA: Diagnosis not present

## 2015-08-28 DIAGNOSIS — Z87891 Personal history of nicotine dependence: Secondary | ICD-10-CM | POA: Diagnosis not present

## 2015-08-28 DIAGNOSIS — Z951 Presence of aortocoronary bypass graft: Secondary | ICD-10-CM | POA: Diagnosis not present

## 2015-08-28 DIAGNOSIS — R1010 Upper abdominal pain, unspecified: Secondary | ICD-10-CM

## 2015-08-28 DIAGNOSIS — K922 Gastrointestinal hemorrhage, unspecified: Secondary | ICD-10-CM

## 2015-08-28 DIAGNOSIS — D649 Anemia, unspecified: Secondary | ICD-10-CM | POA: Insufficient documentation

## 2015-08-28 DIAGNOSIS — I10 Essential (primary) hypertension: Secondary | ICD-10-CM | POA: Diagnosis not present

## 2015-08-28 DIAGNOSIS — R5383 Other fatigue: Secondary | ICD-10-CM

## 2015-08-28 HISTORY — DX: Unilateral inguinal hernia, without obstruction or gangrene, not specified as recurrent: K40.90

## 2015-08-28 LAB — COMPREHENSIVE METABOLIC PANEL
ALBUMIN: 2.7 g/dL — AB (ref 3.5–5.0)
ALT: 13 U/L — ABNORMAL LOW (ref 17–63)
AST: 37 U/L (ref 15–41)
Alkaline Phosphatase: 47 U/L (ref 38–126)
Anion gap: 7 (ref 5–15)
BUN: 28 mg/dL — AB (ref 6–20)
CHLORIDE: 99 mmol/L — AB (ref 101–111)
CO2: 29 mmol/L (ref 22–32)
Calcium: 8.7 mg/dL — ABNORMAL LOW (ref 8.9–10.3)
Creatinine, Ser: 0.76 mg/dL (ref 0.61–1.24)
GFR calc Af Amer: >60 mL/min (ref >60)
GFR calc non Af Amer: >60 mL/min (ref >60)
GLUCOSE: 125 mg/dL — AB (ref 65–99)
POTASSIUM: 4.5 mmol/L (ref 3.5–5.1)
SODIUM: 135 mmol/L (ref 135–145)
Total Bilirubin: 1.4 mg/dL — ABNORMAL HIGH (ref 0.3–1.2)
Total Protein: 8.6 g/dL — ABNORMAL HIGH (ref 6.5–8.1)

## 2015-08-28 LAB — CBC WITH DIFFERENTIAL/PLATELET
BASOS ABS: 0 10*3/uL (ref 0.0–0.1)
BASOS PCT: 0 %
EOS PCT: 0 %
Eosinophils Absolute: 0 10*3/uL (ref 0.0–0.7)
HCT: 31.8 % — ABNORMAL LOW (ref 39.0–52.0)
Hemoglobin: 10.8 g/dL — ABNORMAL LOW (ref 13.0–17.0)
Lymphocytes Relative: 21 %
Lymphs Abs: 1 10*3/uL (ref 0.7–4.0)
MCH: 33.9 pg (ref 26.0–34.0)
MCHC: 34 g/dL (ref 30.0–36.0)
MCV: 99.7 fL (ref 78.0–100.0)
Monocytes Absolute: 0.8 10*3/uL (ref 0.1–1.0)
Monocytes Relative: 17 %
NEUTROS ABS: 2.9 10*3/uL (ref 1.7–7.7)
Neutrophils Relative %: 62 %
PLATELETS: 133 10*3/uL — AB (ref 150–400)
RBC: 3.19 MIL/uL — ABNORMAL LOW (ref 4.22–5.81)
RDW: 13.4 % (ref 11.5–15.5)
WBC: 4.7 10*3/uL (ref 4.0–10.5)

## 2015-08-28 LAB — URINALYSIS, ROUTINE W REFLEX MICROSCOPIC
Bilirubin Urine: NEGATIVE
Glucose, UA: NEGATIVE mg/dL
HGB URINE DIPSTICK: NEGATIVE
Ketones, ur: 15 mg/dL — AB
LEUKOCYTES UA: NEGATIVE
Nitrite: NEGATIVE
PROTEIN: NEGATIVE mg/dL
Specific Gravity, Urine: 1.026 (ref 1.005–1.030)
Urobilinogen, UA: 1 mg/dL (ref 0.0–1.0)
pH: 5 (ref 5.0–8.0)

## 2015-08-28 LAB — CBC
HCT: 30.7 % — ABNORMAL LOW (ref 39.0–52.0)
HCT: 30.9 % — ABNORMAL LOW (ref 39.0–52.0)
HEMOGLOBIN: 10.6 g/dL — AB (ref 13.0–17.0)
Hemoglobin: 10.4 g/dL — ABNORMAL LOW (ref 13.0–17.0)
MCH: 34.1 pg — AB (ref 26.0–34.0)
MCH: 33.5 pg (ref 26.0–34.0)
MCHC: 34.5 g/dL (ref 30.0–36.0)
MCHC: 33.7 g/dL (ref 30.0–36.0)
MCV: 98.7 fL (ref 78.0–100.0)
MCV: 99.7 fL (ref 78.0–100.0)
PLATELETS: 117 10*3/uL — AB (ref 150–400)
Platelets: 118 10*3/uL — ABNORMAL LOW (ref 150–400)
RBC: 3.11 MIL/uL — AB (ref 4.22–5.81)
RBC: 3.1 MIL/uL — AB (ref 4.22–5.81)
RDW: 13.5 % (ref 11.5–15.5)
RDW: 13.4 % (ref 11.5–15.5)
WBC: 3.9 10*3/uL — ABNORMAL LOW (ref 4.0–10.5)
WBC: 3.9 10*3/uL — AB (ref 4.0–10.5)

## 2015-08-28 LAB — RETICULOCYTES
RBC.: 3.23 MIL/uL — ABNORMAL LOW (ref 4.22–5.81)
Retic Count, Absolute: 29.1 10*3/uL (ref 19.0–186.0)
Retic Ct Pct: 0.9 % (ref 0.4–3.1)

## 2015-08-28 LAB — VITAMIN B12: Vitamin B-12: 552 pg/mL (ref 180–914)

## 2015-08-28 LAB — FOLATE: Folate: 25 ng/mL (ref >5.9)

## 2015-08-28 LAB — FERRITIN: FERRITIN: 77 ng/mL (ref 24–336)

## 2015-08-28 LAB — IRON AND TIBC
IRON: 35 ug/dL — AB (ref 45–182)
Saturation Ratios: 13 % — ABNORMAL LOW (ref 17.9–39.5)
TIBC: 263 ug/dL (ref 250–450)
UIBC: 228 ug/dL

## 2015-08-28 LAB — PROTIME-INR
INR: 1.2 (ref 0.00–1.49)
Prothrombin Time: 15.3 seconds — ABNORMAL HIGH (ref 11.6–15.2)

## 2015-08-28 LAB — LIPASE, BLOOD: LIPASE: 21 U/L (ref 11–51)

## 2015-08-28 MED ORDER — BENZONATATE 100 MG PO CAPS: 100.0000 mg | ORAL_CAPSULE | Freq: Three times a day (TID) | ORAL | Status: DC | PRN

## 2015-08-28 MED ORDER — LEVOTHYROXINE SODIUM 25 MCG PO TABS
25.0000 ug | ORAL_TABLET | Freq: Every day | ORAL | Status: DC
  Administered 2015-08-29 – 2015-09-01 (×3): 25 ug via ORAL
  Filled 2015-08-28 (×4): qty 1

## 2015-08-28 MED ORDER — FLUTICASONE PROPIONATE 50 MCG/ACT NA SUSP
1.0000 | Freq: Every day | NASAL | Status: DC
  Administered 2015-08-28 – 2015-09-01 (×4): 1 via NASAL
  Filled 2015-08-28: qty 16

## 2015-08-28 MED ORDER — HYDROCODONE-ACETAMINOPHEN 5-325 MG PO TABS
1.0000 | ORAL_TABLET | Freq: Four times a day (QID) | ORAL | Status: DC | PRN
  Administered 2015-08-28: 1 via ORAL
  Filled 2015-08-28: qty 1

## 2015-08-28 MED ORDER — ACETAMINOPHEN 325 MG PO TABS
650.0000 mg | ORAL_TABLET | Freq: Four times a day (QID) | ORAL | Status: DC | PRN
Start: 2015-08-28 — End: 2015-09-01
  Administered 2015-08-29 – 2015-08-31 (×2): 650 mg via ORAL
  Filled 2015-08-28 (×2): qty 2

## 2015-08-28 MED ORDER — ONDANSETRON 4 MG PO TBDP
4.0000 mg | ORAL_TABLET | Freq: Once | ORAL | Status: AC
  Administered 2015-08-28: 4 mg via ORAL
  Filled 2015-08-28: qty 1

## 2015-08-28 MED ORDER — IOHEXOL 300 MG/ML  SOLN
100.0000 mL | Freq: Once | INTRAMUSCULAR | Status: AC | PRN
  Administered 2015-08-28: 100 mL via INTRAVENOUS

## 2015-08-28 MED ORDER — TRAZODONE HCL 100 MG PO TABS
200.0000 mg | ORAL_TABLET | Freq: Every evening | ORAL | Status: DC | PRN
  Administered 2015-08-28: 200 mg via ORAL
  Filled 2015-08-28: qty 2

## 2015-08-28 MED ORDER — ONDANSETRON 4 MG PO TBDP
4.0000 mg | ORAL_TABLET | ORAL | Status: DC | PRN
  Administered 2015-08-29: 4 mg via ORAL
  Filled 2015-08-28 (×3): qty 1

## 2015-08-28 MED ORDER — ACETAMINOPHEN 650 MG RE SUPP
650.0000 mg | Freq: Four times a day (QID) | RECTAL | Status: DC | PRN
Start: 2015-08-28 — End: 2015-09-01

## 2015-08-28 MED ORDER — HYDROCODONE-ACETAMINOPHEN 5-325 MG PO TABS
1.0000 | ORAL_TABLET | Freq: Once | ORAL | Status: AC
  Administered 2015-08-28: 1 via ORAL
  Filled 2015-08-28: qty 1

## 2015-08-28 MED ORDER — SODIUM CHLORIDE 0.9 % IJ SOLN
3.0000 mL | Freq: Two times a day (BID) | INTRAMUSCULAR | Status: DC
  Administered 2015-08-28 – 2015-09-01 (×5): 3 mL via INTRAVENOUS

## 2015-08-28 MED ORDER — GUAIFENESIN ER 600 MG PO TB12
600.0000 mg | ORAL_TABLET | Freq: Two times a day (BID) | ORAL | Status: DC | PRN
  Administered 2015-08-29 – 2015-08-31 (×2): 600 mg via ORAL
  Filled 2015-08-28 (×2): qty 1

## 2015-08-28 MED ORDER — SENNOSIDES-DOCUSATE SODIUM 8.6-50 MG PO TABS
1.0000 | ORAL_TABLET | Freq: Two times a day (BID) | ORAL | Status: DC
  Administered 2015-08-28 – 2015-08-29 (×3): 1 via ORAL
  Filled 2015-08-28 (×3): qty 1

## 2015-08-28 MED ORDER — SODIUM CHLORIDE 0.9 % IV SOLN
INTRAVENOUS | Status: DC
  Administered 2015-08-28: 16:00:00 via INTRAVENOUS

## 2015-08-28 MED ORDER — LORAZEPAM 0.5 MG PO TABS
0.5000 mg | ORAL_TABLET | Freq: Two times a day (BID) | ORAL | Status: DC | PRN
  Administered 2015-08-29: 0.5 mg via ORAL
  Filled 2015-08-28: qty 1

## 2015-08-28 MED ORDER — BISOPROLOL-HYDROCHLOROTHIAZIDE 5-6.25 MG PO TABS
1.0000 | ORAL_TABLET | Freq: Every day | ORAL | Status: DC
  Administered 2015-08-28 – 2015-08-29 (×2): 1 via ORAL
  Filled 2015-08-28 (×2): qty 1

## 2015-08-28 MED ORDER — PANTOPRAZOLE SODIUM 40 MG IV SOLR
40.0000 mg | Freq: Two times a day (BID) | INTRAVENOUS | Status: DC
  Administered 2015-08-28 – 2015-08-29 (×3): 40 mg via INTRAVENOUS
  Filled 2015-08-28 (×2): qty 40

## 2015-08-28 MED ORDER — NITROGLYCERIN 0.4 MG SL SUBL: 0.4000 mg | SUBLINGUAL_TABLET | SUBLINGUAL | Status: DC | PRN

## 2015-08-28 MED ORDER — POLYETHYLENE GLYCOL 3350 17 G PO PACK
17.0000 g | PACK | Freq: Every day | ORAL | Status: DC
  Administered 2015-08-28 – 2015-08-29 (×2): 17 g via ORAL
  Filled 2015-08-28 (×2): qty 1

## 2015-08-28 MED ORDER — TRAMADOL HCL 50 MG PO TABS
50.0000 mg | ORAL_TABLET | Freq: Two times a day (BID) | ORAL | Status: DC
  Administered 2015-08-28 – 2015-08-29 (×2): 50 mg via ORAL
  Filled 2015-08-28 (×2): qty 1

## 2015-08-28 MED ORDER — SIMVASTATIN 10 MG PO TABS
10.0000 mg | ORAL_TABLET | Freq: Every day | ORAL | Status: DC
  Administered 2015-08-28 – 2015-09-01 (×4): 10 mg via ORAL
  Filled 2015-08-28 (×5): qty 1

## 2015-08-28 NOTE — ED Provider Notes (Signed)
Medical screening examination/treatment/procedure(s) were conducted as a shared visit with non-physician practitioner(s) and myself.  I personally evaluated the patient during the encounter.   81 year old male with history of CAD, diverticulosis, appendectomy, hernia repair, known right inguinal hernia, and prior small bowel resection who presents with upper abdominal pain. Was seen in the ED 4 days ago for right groin and lower abdominal pain. Had a CT renal scan that showed persistent and enlarging right inguinal hernia, without evidence of strangulation or incarceration. States that he has not been able to have a bowel movement for the past 4 days, which usually resolves with enema. However with enema today had bright red blood per rectum. Also having upper abdominal pain with mild abdominal distention and nausea during this time as well. Hemodynamically stable on arrival, it is not acutely ill appearing. Has a soft abdomen, diffusely tender, worse in the upper abdomen. No peritoneal signs noted. He has evidence of a large amount of bright red blood per rectum. 1 g hemoglobin drop since last blood work, but hemodynamically stable. Will perform CT with contrast today and likely admit for serial hemoglobins and GI consult if CT scan unremarkable.  Forde Dandy, MD 08/28/15 [phone]

## 2015-08-28 NOTE — ED Provider Notes (Signed)
CSN: [id number]     Arrival date & time 08/28/15  0617 History   First MD Initiated Contact with Patient 08/28/15 [phone]     Chief Complaint  Patient presents with  . Abdominal Pain  . Nausea   HPI  Andre Mueller is a 81 year old male with PMHx of GERD, CAD, diverticulosis and HTN presenting with abdominal pain and nausea. Pain began 3 days ago and located in the umbilical region. Pain is described as aching and severe. He reports that he has been constipated for the past 4 days and attempted a home enema last evening. He states that he "only got about a drop out" and is still feeling constipated. His assisted living facility noted blood in his briefs this morning. Pt states that he was unaware that there was any blood. He is also complaining of nausea without vomiting. Denies fevers, chills, dizziness, lightheadedness, syncope, headache, chest pain, SOB, abdominal distension, dysuria, hematuria or rectal pain. Pt was seen 4 days ago for RLQ pain. CT scan of abdomen without contrast showed right inguinal hernia enlarging in size without incarceration and no other acute abdominal findings.   Past Medical History  Diagnosis Date  . Hypercholesteremia   . Anxiety   . GERD (gastroesophageal reflux disease)   . Glaucoma   . Esophageal tear 1990's?  . Hypertension   . Constipation   . Cancer ([place])     hx of waldrens disease  . Benign neoplasm of colon 02/2012    polyps x 2 adenomatous w/o high grade dysplasia  . CAD (coronary artery disease) 2007    s/p CABG in [place]  . Diverticulosis of colon (without mention of hemorrhage)     hx LGIB, 04/2012 hosp  . History of resection of small bowel   . Osteoporosis, senile     hx T11 compression fx 02/2012, s/p KP  . Inguinal hernia   . Anemia    Past Surgical History  Procedure Laterality Date  . Cardiac surgery    . Appendectomy    . Hernia repair    . Colonoscopy  02/29/2012    Procedure: COLONOSCOPY;  Surgeon: Lafayette Dragon, MD;  Location: [facility]  ENDOSCOPY;  Service: Endoscopy;  Laterality: N/A;  . Esophagus surgery      due to esophageal tear  . Small intestine surgery      20 yrs ago; in [place]  . Coronary artery bypass graft      > 6 yrs  . Kyphosis surgery    . Orif hip fracture Left 07/19/2013    Procedure: OPEN REDUCTION INTERNAL FIXATION HIP;  Surgeon: Marin Shutter, MD;  Location: [place];  Service: Orthopedics;  Laterality: Left;   Family History  Problem Relation Age of Onset  . Colon cancer Neg Hx   . Malignant hyperthermia Neg Hx    Social History  Substance Use Topics  . Smoking status: Former Smoker    Quit date: 09/19/1962  . 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 Alcohol Use: No    Review of Systems  Constitutional: Negative for fever and chills.  HENT: Negative for congestion and rhinorrhea.   Eyes: Negative for visual disturbance.  Respiratory: Negative for cough and shortness of breath.   Cardiovascular:  Negative for chest pain.  Gastrointestinal: Positive for nausea, abdominal pain, constipation, blood in stool and anal bleeding. Negative for vomiting, diarrhea, abdominal distention and rectal pain.  Genitourinary: Negative for dysuria, hematuria and flank pain.  Musculoskeletal: Negative for back pain and neck pain.  Skin: Negative for rash.  Neurological: Negative for dizziness, syncope, weakness, light-headedness and headaches.  All other systems reviewed and are negative.     Allergies  Penicillins  Home Medications   Prior to Admission medications   Medication Sig Start Date End Date Taking? Authorizing Provider  aspirin EC 81 MG tablet Take 81 mg by mouth daily.   Yes Historical Provider, MD  benzonatate (TESSALON) 100 MG capsule Take 100 mg by mouth 3 (three) times daily as  needed for cough.  12/16/14  Yes Historical Provider, MD  bisoprolol-hydrochlorothiazide (ZIAC) 5-6.25 MG per tablet Take 1 tablet by mouth daily.   Yes Historical Provider, MD  cholecalciferol (VITAMIN D) 1000 UNITS tablet Take 1,000 Units by mouth daily.   Yes Historical Provider, MD  fluticasone (FLONASE) 50 MCG/ACT nasal spray Place 1 spray into the nose daily. allergies 02/04/12  Yes Historical Provider, MD  furosemide (LASIX) 20 MG tablet Take 20 mg by mouth daily as needed for edema.   Yes Historical Provider, MD  guaiFENesin (MUCINEX) 600 MG 12 hr tablet Take 1 tablet (600 mg total) by mouth 2 (two) times daily. Patient taking differently: Take 600 mg by mouth every 12 (twelve) hours as needed for cough or to loosen phlegm.  01/12/15  Yes Alvina Chou, PA-C  HYDROcodone-acetaminophen (NORCO/VICODIN) 5-325 MG tablet Take 1 tablet by mouth every 6 (six) hours as needed for moderate pain. 08/24/15  Yes Milton Ferguson, MD  levothyroxine (SYNTHROID, LEVOTHROID) 25 MCG tablet Take 25 mcg by mouth daily before breakfast.   Yes Historical Provider, MD  LORazepam (ATIVAN) 0.5 MG tablet Take 0.5 mg by mouth 2 (two) times daily as needed for anxiety.  12/15/14  Yes Historical Provider, MD  Multiple Vitamin (MULITIVITAMIN WITH MINERALS) TABS Take 1 tablet by mouth daily.   Yes Historical Provider, MD  nitroGLYCERIN (NITROSTAT) 0.4 MG SL tablet Place 0.4 mg under the tongue every 5 (five) minutes as needed for chest pain.    Yes Historical Provider, MD  omeprazole (PRILOSEC) 20 MG capsule Take 20 mg by mouth daily.   Yes Historical Provider, MD  ondansetron (ZOFRAN ODT) 4 MG disintegrating tablet 4mg  ODT q4 hours prn nausea/vomit 08/24/15  Yes Milton Ferguson, MD  promethazine (PHENERGAN) 25 MG tablet Take 25 mg by mouth every 6 (six) hours as needed for nausea or vomiting.   Yes Historical Provider, MD  senna (SENOKOT) 8.6 MG TABS Take 3 tablets by mouth at bedtime.    Yes Historical Provider, MD   simvastatin (ZOCOR) 10 MG tablet Take 10 mg by mouth daily.   Yes Historical Provider, MD  traMADol (ULTRAM) 50 MG tablet Take 50 mg by mouth 2 (two) times daily.    Yes Historical Provider, MD  traZODone (DESYREL) 100 MG tablet Take 200 mg by mouth at bedtime as needed for sleep.  12/15/14  Yes Historical Provider, MD   BP 158/70 mmHg  Pulse 84  Temp(Src) 98.3 F (36.8 C) (Oral)  Resp 16  SpO2 97% Physical Exam  Constitutional: He appears well-developed and well-nourished. No distress.  HENT:  Head: Normocephalic and atraumatic.  Eyes: Conjunctivae are normal. Right eye exhibits no discharge. Left eye exhibits no discharge. No scleral icterus.  Neck: Normal range  of motion.  Cardiovascular: Normal rate, regular rhythm, normal heart sounds and intact distal pulses.   Pedal pulses palpable  Pulmonary/Chest: Effort normal and breath sounds normal. No respiratory distress. He has no wheezes. He has no rales.  Abdominal: Soft. Bowel sounds are normal. He exhibits no distension. There is tenderness. There is no rebound and no guarding.  Mild umbilical tenderness. No rebound or guarding.  Genitourinary:  On rectal exam, bright red blood per rectum present.   Musculoskeletal: Normal range of motion.  Neurological: He is alert. He exhibits normal muscle tone. Coordination normal.  Skin: Skin is warm and dry.  Psychiatric: He has a normal mood and affect. His behavior is normal.  Nursing note and vitals reviewed.   ED Course  Procedures (including critical care time) Labs Review Labs Reviewed  CBC WITH DIFFERENTIAL/PLATELET - Abnormal; Notable for the following:    RBC 3.19 (*)    Hemoglobin 10.8 (*)    HCT 31.8 (*)    Platelets 133 (*)    All other components within normal limits  COMPREHENSIVE METABOLIC PANEL - Abnormal; Notable for the following:    Chloride 99 (*)    Glucose, Bld 125 (*)    BUN 28 (*)    Calcium 8.7 (*)    Total Protein 8.6 (*)    Albumin 2.7 (*)    ALT 13  (*)    Total Bilirubin 1.4 (*)    All other components within normal limits  URINALYSIS, ROUTINE W REFLEX MICROSCOPIC (NOT AT [facility]) - Abnormal; Notable for the following:    Color, Urine AMBER (*)    APPearance CLOUDY (*)    Ketones, ur 15 (*)    All other components within normal limits  CBC - Abnormal; Notable for the following:    WBC 3.9 (*)    RBC 3.11 (*)    Hemoglobin 10.6 (*)    HCT 30.7 (*)    MCH 34.1 (*)    Platelets 118 (*)    All other components within normal limits  PROTIME-INR - Abnormal; Notable for the following:    Prothrombin Time 15.3 (*)    All other components within normal limits  IRON AND TIBC - Abnormal; Notable for the following:    Iron 35 (*)    Saturation Ratios 13 (*)    All other components within normal limits  RETICULOCYTES - Abnormal; Notable for the following:    RBC. 3.23 (*)    All other components within normal limits  LIPASE, BLOOD  VITAMIN B12  FERRITIN  CBC  FOLATE  BASIC METABOLIC PANEL  CBC    Imaging Review Ct Abdomen Pelvis W Contrast  08/28/2015  CLINICAL DATA:  Nausea vomiting and rectal bleeding. EXAM: CT ABDOMEN AND PELVIS WITH CONTRAST TECHNIQUE: Multidetector CT imaging of the abdomen and pelvis was performed using the standard protocol following bolus administration of intravenous contrast. CONTRAST:  129mL OMNIPAQUE IOHEXOL 300 MG/ML  SOLN COMPARISON:  CT 08/24/2015 FINDINGS: Lower chest: Mild increase in RIGHT pleural effusion. Small LEFT effusion stable. Mild basilar atelectasis. Dense calcification of the RIGHT hemidiaphragm Hepatobiliary: No focal hepatic lesion. Gallbladder is not identified. There is mild intrahepatic and extrahepatic duct dilatation. The common bile duct measures upper limits of normal at 6 mm. Pancreas: Pancreatic atrophy present. No pancreatic mass or duct dilatation. Spleen: Normal spleen Adrenals/urinary tract: Adrenal glands and kidneys are normal. The ureters and bladder normal. Stomach/Bowel:  Stomach, small-bowel normal. There is a RIGHT inguinal hernia which contains a  long segment of nonobstructed small bowel. This not changed comparison exam. The colon is distended mildly and stool-filled. The rectum has fluid stool and is mildly distended. Vascular/Lymphatic: Abdominal aorta is normal caliber with atherosclerotic calcification. There is no retroperitoneal or periportal lymphadenopathy. No pelvic lymphadenopathy. Reproductive: Prostate is enlarged to 50  mm Musculoskeletal: No aggressive osseous lesion. Other: No free fluid. IMPRESSION: 1. Moderate volume stool throughout the colon which is partially formed and without obstructing lesion. 2. Large RIGHT inguinal hernia contains a long segment of nonobstructed small bowel, not changed. 3. Mild intrahepatic and extrahepatic biliary duct dilatation without obstructing lesion identified. 4. Intrarenal increase in RIGHT pleural effusion which is small to moderate. Electronically Signed   By: Suzy Bouchard M.D.   On: 08/28/2015 11:12   Dg Abd Acute W/chest  08/28/2015  CLINICAL DATA:  LEFT lower quadrant abdominal pain and constipation EXAM: DG ABDOMEN ACUTE W/ 1V CHEST COMPARISON:  Radiograph 1022 1,016, CT 08/24/2015 FINDINGS: Sternotomy wires overlie normal cardiac silhouette. There are low lung volumes mild basilar scarring and atelectasis. No airspace disease. LEFT lateral decubitus view demonstrates no intraperitoneal free air. No dilated loops of large or small bowel. There is stool in the rectum. No inguinal hernia evident. IMPRESSION: 1. No acute cardiopulmonary findings.  Basilar atelectasis. 2. No bowel obstruction or intraperitoneal free air. Electronically Signed   By: Suzy Bouchard M.D.   On: 08/28/2015 09:54   I have personally reviewed and evaluated these images and lab results as part of my medical decision-making.   EKG Interpretation None      MDM   Final diagnoses:  Pain of upper abdomen  Gastrointestinal  hemorrhage, unspecified gastritis, unspecified gastrointestinal hemorrhage type   Pt presenting with hx of CAD, diverticulosis and right inguinal hernia comes in with umbilical abdominal pain with associated nausea and rectal bleeding. Pain began 3 days ago and is aching. Pt thought he was constipated and did a home enema last evening that did not resolve constipation. This morning his facility noted bright red blood in his brief. Was seen in ED 4 days ago for RLQ pain which is not his complaint today. Hemodynamically stable and non-toxic appearing. He has mild umbilical abdominal tenderness. No rebound or guarding. On rectal, bright red blood is noted. His Hgb has dropped from 11.6 to 10.8 over the past 4 days. Abd CT shows moderate stool volume without obstruction, non-strangulated right hernia, mild intra and extra hepatic biliary duct dilation and b/l pulmonary effusions noted on last CT. Consulted [facility] hospitalist who will admit pt for serial Hgbs and possible GI consult.  Nikitas Davtyan (son): [phone]     Josephina Gip, PA-C 08/28/15 [place], MD 08/30/15 0111

## 2015-08-28 NOTE — ED Notes (Signed)
Patient transported to X-ray 

## 2015-08-28 NOTE — H&P (Signed)
[facility] History and Physical  Andre Mueller ZOX:[id number] DOB: 1921-06-01 DOA: 08/28/2015 Referring physician: Oleta Mouse PCP: London Pepper, MD   Chief Complaint:   HPI: Andre Mueller is a very pleasant 81 y.o. male with past medical history that includes GERD, CAD, diverticulosis, hypertension presents emergency Department chief complaint of abdominal pain. Initial evaluation in the emergency department revealed bright red blood per rectum acute on chronic anemia in the setting of constipation.  Information is obtained from the patient. He reports several day history of no bowel movement and yesterday evening attempted to administer an enema to himself. He states "it did not work". He admits to having difficulty inserting an enema tube. And that "everything was fine" until I tried that. The facility reports finding blood in his briefs this morning. Patient is unaware of any bleeding. He states he developed abdominal pain this morning which is why came to the ED. He describes the pain as sharp constant in the umbilical area. Nothing makes it better or worse. He has had intermittent nausea without vomiting. He denies chest pain palpitation headache dizziness syncope or near-syncope. He denies any coughing fever chills dysuria hematuria frequency or urgency. He does take 81 mg of aspirin at home. He also reports colonoscopy in the past.   Workup in the emergency department reveals a principal metabolic panel significant for serum glucose 125 BUN 28 creatinine 0.76 ALT 13 total bili 1.4, complete blood count significant for hemoglobin of 10.8 hematocrit 31.8. Analysis unremarkable chest x-ray without acute cardiopulmonary process. CT of the abdomen moderate volume stool throughout the colon which is partially formed and without obstructing lesion Large RIGHT inguinal hernia contains a long segment of nonobstructed small bowel, not changed. Mild intrahepatic and extrahepatic biliary duct dilatation  without obstructing lesion identified. Intrarenal increase in RIGHT pleural effusion which is small to moderate. Upon presentation he is hemodynamically stable afebrile and not hypoxic.  In the emergency department he is given Zofran, and Norco. He had some blood noted in his bed linens while in the emergency department.  Review of Systems:  10 point review of systems complete and all systems are negative except as indicated in the history of present illness  Past Medical History  Diagnosis Date  . Hypercholesteremia   . Anxiety   . GERD (gastroesophageal reflux disease)   . Glaucoma   . Esophageal tear 1990's?  . Hypertension   . Constipation   . Cancer ([place])     hx of waldrens disease  . Benign neoplasm of colon 02/2012    polyps x 2 adenomatous w/o high grade dysplasia  . CAD (coronary artery disease) 2007    s/p CABG in [place]  . Diverticulosis of colon (without mention of hemorrhage)     hx LGIB, 04/2012 hosp  . History of resection of small bowel   . Osteoporosis, senile     hx T11 compression fx 02/2012, s/p KP  . Inguinal hernia   . Anemia    Past Surgical History  Procedure Laterality Date  . Cardiac surgery    . Appendectomy    . Hernia repair    . Colonoscopy  02/29/2012    Procedure: COLONOSCOPY;  Surgeon: Lafayette Dragon, MD;  Location: [facility];  Service: Endoscopy;  Laterality: N/A;  . Esophagus surgery      due to esophageal tear  . Small intestine surgery      20 yrs ago; in [place]  . Coronary artery bypass graft      >  6 yrs  . Kyphosis surgery    . Orif hip fracture Left 07/19/2013    Procedure: OPEN REDUCTION INTERNAL FIXATION HIP;  Surgeon: Marin Shutter, MD;  Location: [place];  Service: Orthopedics;  Laterality: Left;   Social History:  reports that he quit smoking about 52 years ago. He has never used smokeless tobacco. He reports that he does not drink alcohol or use illicit drugs. 81 year old lives at [facility] he is independent with ADLs he  has a son who lives nearby he is a retired professor Allergies  Allergen Reactions  . Penicillins Swelling    Family History  Problem Relation Age of Onset  . Colon cancer Neg Hx   . Malignant hyperthermia Neg Hx    MI medical history reviewed noncontributory to the admission of this elderly gentleman  Prior to Admission medications   Medication Sig Start Date End Date Taking? Authorizing Provider  aspirin EC 81 MG tablet Take 81 mg by mouth daily.   Yes Historical Provider, MD  benzonatate (TESSALON) 100 MG capsule Take 100 mg by mouth 3 (three) times daily as needed for cough.  12/16/14  Yes Historical Provider, MD  bisoprolol-hydrochlorothiazide (ZIAC) 5-6.25 MG per tablet Take 1 tablet by mouth daily.   Yes Historical Provider, MD  cholecalciferol (VITAMIN D) 1000 UNITS tablet Take 1,000 Units by mouth daily.   Yes Historical Provider, MD  fluticasone (FLONASE) 50 MCG/ACT nasal spray Place 1 spray into the nose daily. allergies 02/04/12  Yes Historical Provider, MD  furosemide (LASIX) 20 MG tablet Take 20 mg by mouth daily as needed for edema.   Yes Historical Provider, MD  guaiFENesin (MUCINEX) 600 MG 12 hr tablet Take 1 tablet (600 mg total) by mouth 2 (two) times daily. Patient taking differently: Take 600 mg by mouth every 12 (twelve) hours as needed for cough or to loosen phlegm.  01/12/15  Yes Alvina Chou, PA-C  HYDROcodone-acetaminophen (NORCO/VICODIN) 5-325 MG tablet Take 1 tablet by mouth every 6 (six) hours as needed for moderate pain. 08/24/15  Yes Milton Ferguson, MD  levothyroxine (SYNTHROID, LEVOTHROID) 25 MCG tablet Take 25 mcg by mouth daily before breakfast.   Yes Historical Provider, MD  LORazepam (ATIVAN) 0.5 MG tablet Take 0.5 mg by mouth 2 (two) times daily as needed for anxiety.  12/15/14  Yes Historical Provider, MD  Multiple Vitamin (MULITIVITAMIN WITH MINERALS) TABS Take 1 tablet by mouth daily.   Yes Historical Provider, MD  nitroGLYCERIN (NITROSTAT) 0.4 MG SL  tablet Place 0.4 mg under the tongue every 5 (five) minutes as needed for chest pain.    Yes Historical Provider, MD  omeprazole (PRILOSEC) 20 MG capsule Take 20 mg by mouth daily.   Yes Historical Provider, MD  ondansetron (ZOFRAN ODT) 4 MG disintegrating tablet 4mg  ODT q4 hours prn nausea/vomit 08/24/15  Yes Milton Ferguson, MD  promethazine (PHENERGAN) 25 MG tablet Take 25 mg by mouth every 6 (six) hours as needed for nausea or vomiting.   Yes Historical Provider, MD  senna (SENOKOT) 8.6 MG TABS Take 3 tablets by mouth at bedtime.    Yes Historical Provider, MD  simvastatin (ZOCOR) 10 MG tablet Take 10 mg by mouth daily.   Yes Historical Provider, MD  traMADol (ULTRAM) 50 MG tablet Take 50 mg by mouth 2 (two) times daily.    Yes Historical Provider, MD  traZODone (DESYREL) 100 MG tablet Take 200 mg by mouth at bedtime as needed for sleep.  12/15/14  Yes Historical Provider,  MD   Physical Exam: Filed Vitals:   08/28/15 0938 08/28/15 1000 08/28/15 1015 08/28/15 1052  BP: 150/67 140/69 139/65 152/72  Pulse: 80 79 77 81  Temp: 98 F (36.7 C)     TempSrc: Oral     Resp: 23 14 14 13   SpO2: 97% 96% 95% 96%    Wt Readings from Last 3 Encounters:  04/29/14 68.04 kg (150 lb)  07/19/13 73.5 kg (162 lb 0.6 oz)  03/05/13 71.668 kg (158 lb)    General:  Appears calm and comfortable, slightly pale Eyes: PERRL, normal lids, irises & conjunctiva ENT: grossly normal hearing, lips & tongue, he does membranes of his mouth are slightly pale slightly dry Neck: no LAD, masses or thyromegaly Cardiovascular: RRR, no m/r/g. No LE edema.  Respiratory: CTA bilaterally, no w/r/r. Normal respiratory effort. Abdomen: soft,  positive bowel sounds Skin: no rash or induration seen on limited exam Musculoskeletal: grossly normal tone BUE/BLE Psychiatric: grossly normal mood and affect, speech fluent and appropriate Neurologic: grossly non-focal.          Labs on Admission:  Basic Metabolic Panel:  Recent  Labs Lab 08/24/15 1339 08/28/15 0809  NA 134* 135  K 3.6 4.5  CL 96* 99*  CO2 23 29  GLUCOSE 120* 125*  BUN 16 28*  CREATININE 0.68 0.76  CALCIUM 8.6* 8.7*   Liver Function Tests:  Recent Labs Lab 08/24/15 1339 08/28/15 0809  AST 19 37  ALT 12* 13*  ALKPHOS 52 47  BILITOT 1.3* 1.4*  PROT 8.7* 8.6*  ALBUMIN 2.9* 2.7*    Recent Labs Lab 08/28/15 0809  LIPASE 21   No results for input(s): AMMONIA in the last 168 hours. CBC:  Recent Labs Lab 08/24/15 1339 08/28/15 0809  WBC 7.1 4.7  NEUTROABS 4.7 2.9  HGB 11.6* 10.8*  HCT 33.9* 31.8*  MCV 98.3 99.7  PLT 124* 133*   Cardiac Enzymes: No results for input(s): CKTOTAL, CKMB, CKMBINDEX, TROPONINI in the last 168 hours.  BNP (last 3 results) No results for input(s): BNP in the last 8760 hours.  ProBNP (last 3 results) No results for input(s): PROBNP in the last 8760 hours.  CBG: No results for input(s): GLUCAP in the last 168 hours.  Radiological Exams on Admission: Ct Abdomen Pelvis W Contrast  08/28/2015  CLINICAL DATA:  Nausea vomiting and rectal bleeding. EXAM: CT ABDOMEN AND PELVIS WITH CONTRAST TECHNIQUE: Multidetector CT imaging of the abdomen and pelvis was performed using the standard protocol following bolus administration of intravenous contrast. CONTRAST:  175mL OMNIPAQUE IOHEXOL 300 MG/ML  SOLN COMPARISON:  CT 08/24/2015 FINDINGS: Lower chest: Mild increase in RIGHT pleural effusion. Small LEFT effusion stable. Mild basilar atelectasis. Dense calcification of the RIGHT hemidiaphragm Hepatobiliary: No focal hepatic lesion. Gallbladder is not identified. There is mild intrahepatic and extrahepatic duct dilatation. The common bile duct measures upper limits of normal at 6 mm. Pancreas: Pancreatic atrophy present. No pancreatic mass or duct dilatation. Spleen: Normal spleen Adrenals/urinary tract: Adrenal glands and kidneys are normal. The ureters and bladder normal. Stomach/Bowel: Stomach, small-bowel  normal. There is a RIGHT inguinal hernia which contains a long segment of nonobstructed small bowel. This not changed comparison exam. The colon is distended mildly and stool-filled. The rectum has fluid stool and is mildly distended. Vascular/Lymphatic: Abdominal aorta is normal caliber with atherosclerotic calcification. There is no retroperitoneal or periportal lymphadenopathy. No pelvic lymphadenopathy. Reproductive: Prostate is enlarged to 50  mm Musculoskeletal: No aggressive osseous lesion. Other: No free  fluid. IMPRESSION: 1. Moderate volume stool throughout the colon which is partially formed and without obstructing lesion. 2. Large RIGHT inguinal hernia contains a long segment of nonobstructed small bowel, not changed. 3. Mild intrahepatic and extrahepatic biliary duct dilatation without obstructing lesion identified. 4. Intrarenal increase in RIGHT pleural effusion which is small to moderate. Electronically Signed   By: Suzy Bouchard M.D.   On: 08/28/2015 11:12   Dg Abd Acute W/chest  08/28/2015  CLINICAL DATA:  LEFT lower quadrant abdominal pain and constipation EXAM: DG ABDOMEN ACUTE W/ 1V CHEST COMPARISON:  Radiograph 1022 1,016, CT 08/24/2015 FINDINGS: Sternotomy wires overlie normal cardiac silhouette. There are low lung volumes mild basilar scarring and atelectasis. No airspace disease. LEFT lateral decubitus view demonstrates no intraperitoneal free air. No dilated loops of large or small bowel. There is stool in the rectum. No inguinal hernia evident. IMPRESSION: 1. No acute cardiopulmonary findings.  Basilar atelectasis. 2. No bowel obstruction or intraperitoneal free air. Electronically Signed   By: Suzy Bouchard M.D.   On: 08/28/2015 09:54    EKG:   Assessment/Plan Principal Problem:   BRBPR (bright red blood per rectum): Etiology uncertain patient with a history of sigmoid colon polyps severe diverticulosis and internal hemorrhoids per colonoscopy done in May 2013 in the  setting of recently trying to self administer an enema and rectal bleeding began thereafter. Concern for trauma. Hemoglobin slowly drifting down. He did have 1 episode of bright red blood small amount in the emergency department. Will admit for observation. Will hold aspirin. Provide protonic's twice a day. Will provide bowel regimen. Will get serial CBCs. He is hemodynamically stable. If his hemoglobin remained stable outpatient follow-up with GI likely appropriate. If he continues to drift down will consider GI consult. Of note patient had a GI consult in 2013 for same. Note indicates he has a history of remote esophageal surgery to address esophageal tear that occurred when he was taken to colonoscopy prep. At that time GI opined source of bleeding was either hemorrhoids or diverticular. At this point I'm leaning towards minor trauma from enema attempt in the setting of hemorrhoids. Active Problems:  Anemia: Acute on chronic. Chart review indicates baseline 11.6. Today's hemoglobin 10.8. Will gently hydrate will check his hemoglobin on admission and obtain serial hemoglobins. Anemia panel. Will monitor any further bleeding. Will hold his aspirin. If hemoglobin continues to drift downward will obtain GI consult inpatient versus outpatient    Constipation: This is a chronic problem per chart review. Stool scattered per CT. Will provide Senokot-S twice a day as well as Stanton Kidney lacks daily. Upon discharge she will need to be on fairly vigorous bowel regimen given his history of constipation.  Abdominal pain/nausea: Chart review indicates each of these is a chronic problem. He is on Zofran at home. CT of the abdomen pelvis as noted above. At the time of my exam he denies nausea and is having minimal pain. Suspect this pain is related to #3. Provide supportive therapy  Diverticulosis of large intestine: Colonoscopy May 2013 with 2 polyps in the sigmoid colon and severe diverticulosis in the sigmoid colon otherwise  normal per chart review. Also noted internal hemorrhoids. Doubt this is the source of bleed today     Inguinal hernia: Per CT. Nonobstructive.    GERD (gastroesophageal reflux disease): Stable at baseline. Will increase PPI for now    Hypertension: Lopressor high-end of normal on admission. Home medications include beta blocker and hydrochlorothiazide. Lasix. Will hold Lasix and  continue others. Monitor closely    CAD (coronary artery disease): No chest pain palpitations. EKG without acute changes. Chest x-ray without cardiopulmonary process. Continue home medications        Code Status: full DVT Prophylaxis: Family Communication: none present Disposition Plan: home hopefully 24 hours  Time spent: 75 minu  [facility]

## 2015-08-28 NOTE — ED Notes (Signed)
Per EMS - pt c/o nausea and LLQ abd pain worse with movemetn  x 3 days. Denies v/d. Seen a few days ago for same symptoms. Facility staff noted blood in brief and toilet this morning. Pt had enema earlier this evening.  Given 4mg  zofran by EMS with some relief.

## 2015-08-28 NOTE — ED Notes (Signed)
Patient transported to CT 

## 2015-08-28 NOTE — ED Notes (Signed)
Patient returned from CT

## 2015-08-28 NOTE — ED Notes (Signed)
Report attempted 

## 2015-08-28 NOTE — ED Notes (Signed)
Patient returned from X-ray 

## 2015-08-29 ENCOUNTER — Encounter (HOSPITAL_COMMUNITY): Payer: Self-pay | Admitting: General Practice

## 2015-08-29 DIAGNOSIS — R112 Nausea with vomiting, unspecified: Secondary | ICD-10-CM | POA: Diagnosis present

## 2015-08-29 DIAGNOSIS — D649 Anemia, unspecified: Secondary | ICD-10-CM | POA: Diagnosis not present

## 2015-08-29 DIAGNOSIS — K5909 Other constipation: Secondary | ICD-10-CM | POA: Diagnosis not present

## 2015-08-29 DIAGNOSIS — K625 Hemorrhage of anus and rectum: Secondary | ICD-10-CM | POA: Diagnosis not present

## 2015-08-29 LAB — BASIC METABOLIC PANEL
ANION GAP: 13 (ref 5–15)
BUN: 25 mg/dL — ABNORMAL HIGH (ref 6–20)
CALCIUM: 8.7 mg/dL — AB (ref 8.9–10.3)
CO2: 23 mmol/L (ref 22–32)
Chloride: 101 mmol/L (ref 101–111)
Creatinine, Ser: 0.69 mg/dL (ref 0.61–1.24)
GFR calc Af Amer: >60 mL/min (ref >60)
GFR calc non Af Amer: >60 mL/min (ref >60)
GLUCOSE: 102 mg/dL — AB (ref 65–99)
POTASSIUM: 3.6 mmol/L (ref 3.5–5.1)
Sodium: 137 mmol/L (ref 135–145)

## 2015-08-29 LAB — CBC
HEMATOCRIT: 32.1 % — AB (ref 39.0–52.0)
HEMOGLOBIN: 10.8 g/dL — AB (ref 13.0–17.0)
MCH: 33.6 pg (ref 26.0–34.0)
MCHC: 33.6 g/dL (ref 30.0–36.0)
MCV: 100 fL (ref 78.0–100.0)
Platelets: 96 10*3/uL — ABNORMAL LOW (ref 150–400)
RBC: 3.21 MIL/uL — ABNORMAL LOW (ref 4.22–5.81)
RDW: 13.5 % (ref 11.5–15.5)
WBC: 3.8 10*3/uL — ABNORMAL LOW (ref 4.0–10.5)

## 2015-08-29 MED ORDER — SENNOSIDES-DOCUSATE SODIUM 8.6-50 MG PO TABS
1.0000 | ORAL_TABLET | Freq: Two times a day (BID) | ORAL | Status: DC
  Administered 2015-08-29 – 2015-08-30 (×3): 1 via ORAL
  Filled 2015-08-29 (×7): qty 1

## 2015-08-29 MED ORDER — PANTOPRAZOLE SODIUM 40 MG PO TBEC
40.0000 mg | DELAYED_RELEASE_TABLET | Freq: Every day | ORAL | Status: DC
  Administered 2015-08-29 – 2015-09-01 (×3): 40 mg via ORAL
  Filled 2015-08-29 (×4): qty 1

## 2015-08-29 MED ORDER — POLYETHYLENE GLYCOL 3350 17 G PO PACK
17.0000 g | PACK | Freq: Two times a day (BID) | ORAL | Status: DC
  Administered 2015-08-29 – 2015-08-30 (×2): 17 g via ORAL
  Filled 2015-08-29 (×6): qty 1

## 2015-08-29 MED ORDER — POLYETHYLENE GLYCOL 3350 17 G PO PACK: 17.0000 g | PACK | Freq: Every day | ORAL | Status: AC

## 2015-08-29 MED ORDER — SODIUM CHLORIDE 0.9 % IV SOLN
INTRAVENOUS | Status: DC
  Administered 2015-08-29 – 2015-08-31 (×4): via INTRAVENOUS

## 2015-08-29 NOTE — Clinical Social Work Note (Signed)
Clinical Social Work Assessment  Patient Details  Name: Andre Mueller MRN: [id number] Date of Birth: 01-Oct-1921  Date of referral:  08/29/15               Reason for consult:  Facility Placement                Permission sought to share information with:  Chartered certified accountant granted to share information::  Yes, Verbal Permission Granted  Name::        Agency::  [facility]  Relationship::     Contact Information:     Housing/Transportation Living arrangements for the past 2 months:  [place] of Information:  Patient Patient Interpreter Needed:  None Criminal Activity/Legal Involvement Pertinent to Current Situation/Hospitalization:  No - Comment as needed Significant Relationships:  Adult Children Lives with:    Do you feel safe going back to the place where you live?  Yes (Patient feels safe in order to return back to ALF) Need for family participation in patient care:  Yes (Comment) (Patient has some difficulty speaking.)  Care giving concerns: Patient did not express any concerns upon returning to [place].   Social Worker assessment / plan:  Patient is a 81 year old male who lives by himself in a facility called [facility]. Patient is pleasantly confused but alert and oriented to place and situation.  Patient expresses that he has been living in [facility] for about 6 years now, and he is originally from [place].  Patient expressed that he is from [place] originally, but has been living in [place] for several years.  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  Patient stated that he would like to return to [facility], once he is ready for discharge.  CSW contacted [facility] to confirm he is from the A\lf.  Patient said he has two sons that get alone with each other just fine.   Employment status:  Retired Forensic scientist:   [employer] PT Recommendations:  Research officer, trade union, [place] / Referral to community resources:     Patient/Family's Response to care:  Patient expressed his desire to return back to ALF.  Patient/Family's Understanding of and Emotional Response to Diagnosis, Current Treatment, and Prognosis:  Patient is difficult to understand, but is not sure how long he will be before he is ready to return to facility.   Emotional Assessment Appearance:  Appears stated age Attitude/Demeanor/Rapport:    Affect (typically observed):  Pleasant, Appropriate, Calm Orientation:  Oriented to Self, Oriented to Place Alcohol / Substance use:  Not Applicable Psych involvement (Current and /or in the community):  No (Comment)  Discharge Needs  Concerns to be addressed:  No discharge needs identified Readmission within the last 30 days:  No Current discharge risk:  None, Terminally ill Barriers to Discharge:  Continued Medical Work up   Ross Ludwig, [place] 08/29/2015, 11:00 PM

## 2015-08-29 NOTE — Progress Notes (Addendum)
Received report from Earlie Server, [place] on 5N at 1400.

## 2015-08-29 NOTE — Evaluation (Signed)
Physical Therapy Evaluation Patient Details Name: Andre Mueller MRN: [id number] DOB: 1921/09/01 Today's Date: 08/29/2015   History of Present Illness  Andre Mueller is a very pleasant 81 y.o. male with past medical history that includes GERD, CAD, diverticulosis, hypertension presents emergency Department chief complaint of abdominal pain. Initial evaluation in the emergency department revealed bright red blood per rectum acute on chronic anemia   Clinical Impression  Pt pleasantly confused and an unreliable historian for assist or PLOF. Pt states he was walking some and using a WC. Pt with very limited mobility due to balance, strength and cognition who will benefit from acute therapy to maximize mobility, function and activity to decrease burden of care and fall risk.     Follow Up Recommendations SNF;Supervision/Assistance - 24 hour    Equipment Recommendations  None recommended by PT    Recommendations for Other Services       Precautions / Restrictions Precautions Precautions: Fall Restrictions Weight Bearing Restrictions: No      Mobility  Bed Mobility Overal bed mobility: Needs Assistance Bed Mobility: Supine to Sit     Supine to sit: Min assist     General bed mobility comments: max cues for sequence technique with hand over hand assist to reach for rail and pull to sitting. Greatly increased time to scoot to EOB with max cues  Transfers Overall transfer level: Needs assistance   Transfers: Sit to/from Stand;Stand Pivot Transfers Sit to Stand: Mod assist Stand pivot transfers: Mod assist       General transfer comment: cues for hand placement, sequence and safety. Pt stood with posterior lean, not following commands for safety or hand placement to RW with mod assist to pivot to chair.   Ambulation/Gait Ambulation/Gait assistance:  (not safe to attempt with one person)              Science writer    Modified Rankin  (Stroke Patients Only)       Balance Overall balance assessment: Needs assistance   Sitting balance-Leahy Scale: Fair       Standing balance-Leahy Scale: Poor                               Pertinent Vitals/Pain Pain Assessment: No/denies pain    Home Living Family/patient expects to be discharged to:: Assisted living               Home Equipment: Walker - 2 wheels;Hand held shower head;Shower seat;Grab bars - toilet;Grab bars - tub/shower;Wheelchair - manual      Prior Function Level of Independence: Needs assistance   Gait / Transfers Assistance Needed: RW and WC but unable to state if he needs assist for use and how far he walks  ADL's / Homemaking Assistance Needed: states staff help him bath and dress  Comments: Pt states he lives in an ALF with his wife     Hand Dominance        Extremity/Trunk Assessment   Upper Extremity Assessment: Generalized weakness           Lower Extremity Assessment: Generalized weakness      Cervical / Trunk Assessment: Kyphotic  Communication   Communication: HOH  Cognition Arousal/Alertness: Awake/alert Behavior During Therapy: Flat affect Overall Cognitive Status: Impaired/Different from baseline Area of Impairment: Orientation;Memory;Following commands;Safety/judgement;Problem solving Orientation Level: Disoriented to;Place;Time;Situation   Memory: Decreased short-term memory Following Commands:  Follows one step commands inconsistently;Follows one step commands with increased time Safety/Judgement: Decreased awareness of deficits;Decreased awareness of safety   Problem Solving: Slow processing;Decreased initiation;Difficulty sequencing;Requires verbal cues;Requires tactile cues      General Comments      Exercises        Assessment/Plan    PT Assessment Patient needs continued PT services  PT Diagnosis Difficulty walking;Generalized weakness;Altered mental status   PT Problem List  Decreased strength;Decreased cognition;Decreased activity tolerance;Decreased balance;Decreased knowledge of use of DME;Decreased skin integrity;Decreased mobility  PT Treatment Interventions DME instruction;Gait training;Cognitive remediation;Functional mobility training;Therapeutic activities;Therapeutic exercise;Balance training;Patient/family education   PT Goals (Current goals can be found in the Care Plan section) Acute Rehab PT Goals Patient Stated Goal: return to ALF PT Goal Formulation: With patient Time For Goal Achievement: 09/12/15 Potential to Achieve Goals: Fair    Frequency Min 3X/week   Barriers to discharge Decreased caregiver support      Co-evaluation               End of Session Equipment Utilized During Treatment: Gait belt Activity Tolerance: Patient tolerated treatment well Patient left: in chair;with call bell/phone within reach;with chair alarm set;with nursing/sitter in room Nurse Communication: Mobility status;Precautions    Functional Assessment Tool Used: clinical judgement Functional Limitation: Mobility: Walking and moving around Mobility: Walking and Moving Around Current Status [phone]): At least 40 percent but less than 60 percent impaired, limited or restricted Mobility: Walking and Moving Around Goal Status [phone]): At least 20 percent but less than 40 percent impaired, limited or restricted    Time: 8299-3716 PT Time Calculation (min) (ACUTE ONLY): 17 min   Charges:   PT Evaluation $Initial PT Evaluation Tier I: 1 Procedure     PT G Codes:   PT G-Codes **NOT FOR INPATIENT CLASS** Functional Assessment Tool Used: clinical judgement Functional Limitation: Mobility: Walking and moving around Mobility: Walking and Moving Around Current Status (R6789): At least 40 percent but less than 60 percent impaired, limited or restricted Mobility: Walking and Moving Around Goal Status [phone]): At least 20 percent but less than 40 percent impaired,  limited or restricted    Melford Aase 08/29/2015, 11:21 AM Elwyn Reach, [place]

## 2015-08-29 NOTE — Progress Notes (Signed)
Andre Mueller [id number]  Transfer Data: 08/29/2015 1500  Attending Provider: Delfina Redwood, MD  UKG:[id number], Marjory Lies, MD  Code Status: FULL Andre Mueller is a 81 y.o. male patient transferred from [facility]N -No acute distress noted.  -No complaints of shortness of breath.  -No complaints of chest pain.   Blood pressure 138/54, pulse 74, temperature 97.8 F (36.6 C), temperature source Oral, resp. rate 16, SpO2 93 %.  ?  IV Fluids: IV in place @ left antecubital occlusive dsg intact without redness Allergies: Penicillins  Past Medical History:  has a past medical history of Hypercholesteremia; Anxiety; GERD (gastroesophageal reflux disease); Glaucoma; Esophageal tear (1990's?); Hypertension; Constipation; Cancer ([place]); Benign neoplasm of colon (02/2012); CAD (coronary artery disease) (2007); Diverticulosis of colon (without mention of hemorrhage); History of resection of small bowel; Osteoporosis, senile; Inguinal hernia; and Anemia.  Past Surgical History:  has past surgical history that includes Cardiac surgery; Appendectomy; Hernia repair; Colonoscopy (02/29/2012); Esophagus surgery; Small intestine surgery; Coronary artery bypass graft; Kyphosis surgery; and ORIF hip fracture (Left, 07/19/2013).  Social History:  reports that he quit smoking about 52 years ago. He has never used smokeless tobacco. He reports that he does not drink alcohol or use illicit drugs.  Skin: intact; dry, flaky Patient/Family orientated to room. Information packet given to patient/family. Admission inpatient armband information verified with patient/family to include name and date of birth and placed on patient arm. Side rails up x 2, fall assessment and education completed with patient/family. Patient/family able to verbalize understanding of risk associated with falls and verbalized understanding to call for assistance before getting out of bed. Call light within reach. Patient/family able to voice and demonstrate understanding  of unit orientation instructions.  Will continue to evaluate and treat per MD orders.

## 2015-08-29 NOTE — Progress Notes (Signed)
[facility] PROGRESS NOTE  Andre Mueller OHY:[id number] DOB: 03-09-1921 DOA: 08/28/2015 PCP: London Pepper, MD  Assessment/Plan:  Principal Problem:   BRBPR (bright red blood per rectum): No further. Suspect enema tip trauma. Hemoglobin stable. No need for GI consult at this time, especially given advanced age. Active Problems: Nausea vomiting: Had an episode today. On admission, started on scheduled tramadol. May be contributing. Will stop. Constipation likely contributing as well. Appears dehydrated. We'll give IV fluids Dehydration: IV fluids   Constipation: Continue Senokot. Change marrow Lasix to twice a day.   GERD (gastroesophageal reflux disease)   Hypertension   CAD (coronary artery disease)   Diverticulosis of large intestine   Anemia   Inguinal hernia   Hernia, inguinal  Code Status:  full Family Communication:  Discussed with son by phone Disposition Plan:  to skilled nursing facility/ALF once tolerating diet.   Consultants:    Procedures:     Antibiotics:    HPI/Subjective:  difficult to understand. Per nursing staff, had an episode of vomiting. No reported bleeding. Did not eat much for breakfast.   Objective: Filed Vitals:   08/29/15 0542  BP: 138/54  Pulse: 74  Temp: 97.8 F (36.6 C)  Resp: 16    Intake/Output Summary (Last 24 hours) at 08/29/15 1208 Last data filed at 08/29/15 1048  Gross per 24 hour  Intake      0 ml  Output    300 ml  Net   -300 ml   There were no vitals filed for this visit.  Exam:   General:   frail appearing. Alert in chair. Difficult to understand.  HEENT: Dry mucous membranes   Cardiovascular:  Regular rate rhythm without murmurs gallops rubs  Respiratory:  clear to auscultation bilaterally without wheezes rhonchi or rales   Abdomen:  soft. Nontender. Bowel sounds present.   Ext: No clubbing cyanosis or edema   Basic Metabolic Panel:  Recent Labs Lab 08/24/15 1339 08/28/15 0809  08/29/15 0354  NA 134* 135 137  K 3.6 4.5 3.6  CL 96* 99* 101  CO2 23 29 23   GLUCOSE 120* 125* 102*  BUN 16 28* 25*  CREATININE 0.68 0.76 0.69  CALCIUM 8.6* 8.7* 8.7*   Liver Function Tests:  Recent Labs Lab 08/24/15 1339 08/28/15 0809  AST 19 37  ALT 12* 13*  ALKPHOS 52 47  BILITOT 1.3* 1.4*  PROT 8.7* 8.6*  ALBUMIN 2.9* 2.7*    Recent Labs Lab 08/28/15 0809  LIPASE 21   No results for input(s): AMMONIA in the last 168 hours. CBC:  Recent Labs Lab 08/24/15 1339 08/28/15 0809 08/28/15 1300 08/28/15 1852 08/29/15 0354  WBC 7.1 4.7 3.9* 3.9* 3.8*  NEUTROABS 4.7 2.9  --   --   --   HGB 11.6* 10.8* 10.6* 10.4* 10.8*  HCT 33.9* 31.8* 30.7* 30.9* 32.1*  MCV 98.3 99.7 98.7 99.7 100.0  PLT 124* 133* 118* 117* 96*   Cardiac Enzymes: No results for input(s): CKTOTAL, CKMB, CKMBINDEX, TROPONINI in the last 168 hours. BNP (last 3 results) No results for input(s): BNP in the last 8760 hours.  ProBNP (last 3 results) No results for input(s): PROBNP in the last 8760 hours.  CBG: No results for input(s): GLUCAP in the last 168 hours.  No results found for this or any previous visit (from the past 240 hour(s)).   Studies: Ct Abdomen Pelvis W Contrast  08/28/2015  CLINICAL DATA:  Nausea vomiting and rectal bleeding. EXAM: CT ABDOMEN AND PELVIS  WITH CONTRAST TECHNIQUE: Multidetector CT imaging of the abdomen and pelvis was performed using the standard protocol following bolus administration of intravenous contrast. CONTRAST:  172mL OMNIPAQUE IOHEXOL 300 MG/ML  SOLN COMPARISON:  CT 08/24/2015 FINDINGS: Lower chest: Mild increase in RIGHT pleural effusion. Small LEFT effusion stable. Mild basilar atelectasis. Dense calcification of the RIGHT hemidiaphragm Hepatobiliary: No focal hepatic lesion. Gallbladder is not identified. There is mild intrahepatic and extrahepatic duct dilatation. The common bile duct measures upper limits of normal at 6 mm. Pancreas: Pancreatic atrophy  present. No pancreatic mass or duct dilatation. Spleen: Normal spleen Adrenals/urinary tract: Adrenal glands and kidneys are normal. The ureters and bladder normal. Stomach/Bowel: Stomach, small-bowel normal. There is a RIGHT inguinal hernia which contains a long segment of nonobstructed small bowel. This not changed comparison exam. The colon is distended mildly and stool-filled. The rectum has fluid stool and is mildly distended. Vascular/Lymphatic: Abdominal aorta is normal caliber with atherosclerotic calcification. There is no retroperitoneal or periportal lymphadenopathy. No pelvic lymphadenopathy. Reproductive: Prostate is enlarged to 50  mm Musculoskeletal: No aggressive osseous lesion. Other: No free fluid. IMPRESSION: 1. Moderate volume stool throughout the colon which is partially formed and without obstructing lesion. 2. Large RIGHT inguinal hernia contains a long segment of nonobstructed small bowel, not changed. 3. Mild intrahepatic and extrahepatic biliary duct dilatation without obstructing lesion identified. 4. Intrarenal increase in RIGHT pleural effusion which is small to moderate. Electronically Signed   By: Suzy Bouchard M.D.   On: 08/28/2015 11:12   Dg Abd Acute W/chest  08/28/2015  CLINICAL DATA:  LEFT lower quadrant abdominal pain and constipation EXAM: DG ABDOMEN ACUTE W/ 1V CHEST COMPARISON:  Radiograph 1022 1,016, CT 08/24/2015 FINDINGS: Sternotomy wires overlie normal cardiac silhouette. There are low lung volumes mild basilar scarring and atelectasis. No airspace disease. LEFT lateral decubitus view demonstrates no intraperitoneal free air. No dilated loops of large or small bowel. There is stool in the rectum. No inguinal hernia evident. IMPRESSION: 1. No acute cardiopulmonary findings.  Basilar atelectasis. 2. No bowel obstruction or intraperitoneal free air. Electronically Signed   By: Suzy Bouchard M.D.   On: 08/28/2015 09:54    Scheduled Meds: .  bisoprolol-hydrochlorothiazide  1 tablet Oral Daily  . fluticasone  1 spray Each Nare Daily  . levothyroxine  25 mcg Oral QAC breakfast  . pantoprazole (PROTONIX) IV  40 mg Intravenous Q12H  . polyethylene glycol  17 g Oral Daily  . senna-docusate  1 tablet Oral BID  . simvastatin  10 mg Oral Daily  . sodium chloride  3 mL Intravenous Q12H  . traMADol  50 mg Oral BID   Continuous Infusions:   Time spent: 35 minutes  [facility]  [email or web address], password [facility] 08/29/2015, 12:08 PM

## 2015-08-29 NOTE — NC FL2 (Signed)
[place] LEVEL OF CARE SCREENING TOOL     IDENTIFICATION  Patient Name: Andre Mueller Birthdate: 1921/09/22 Sex: male Admission Date (Current Location): 08/28/2015  [facility] and [place] Number: Herbalist and Address:  [facility] [place], [place] [address], [place], [place] [address]      Provider Number: [id number]  Attending Physician Name and Address:  Delfina Redwood, MD  Relative Name and Phone Number:       Current Level of Care: Hospital Recommended Level of Care: [place] Prior Approval Number:    Date Approved/Denied:   PASRR Number:    Discharge Plan: Other (Comment) (ALF [facility])    Current Diagnoses: Patient Active Problem List   Diagnosis Date Noted  . Nausea with vomiting 08/29/2015  . BRBPR (bright red blood per rectum) 08/28/2015  . Hernia, inguinal 08/28/2015  . Inguinal hernia   . Bleeding gastrointestinal   . Hip fracture, left ([place]) 07/19/2013  . Weakness 03/04/2013  . Anemia 03/04/2013  . Osteoporosis, senile   . GI (gastrointestinal bleed) 05/06/2012  . Diverticulosis of large intestine 05/06/2012  . Benign neoplasm of colon 02/29/2012  . Constipation 02/26/2012  . History of resection of small bowel 02/26/2012  . GERD (gastroesophageal reflux disease) 02/26/2012  . Hypertension 02/26/2012  . Hyperlipidemia 02/26/2012  . CAD (coronary artery disease) 02/26/2012  . Glaucoma 02/26/2012    Orientation ACTIVITIES/SOCIAL BLADDER RESPIRATION    Self, Place    Incontinent    BEHAVIORAL SYMPTOMS/MOOD NEUROLOGICAL BOWEL NUTRITION STATUS      Incontinent    PHYSICIAN VISITS COMMUNICATION OF NEEDS Height & Weight Skin    Verbally   150 lbs. Surgical wounds          AMBULATORY STATUS RESPIRATION    Supervision limited        Personal Care Assistance Level of Assistance  Dressing, Bathing Bathing Assistance: Maximum assistance   Dressing Assistance: Maximum assistance       Functional Limitations Info                SPECIAL CARE FACTORS FREQUENCY                      Additional Factors Info                  Current Medications (08/29/2015): Current Facility-Administered Medications  Medication Dose Route Frequency Provider Last Rate Last Dose  . 0.9 %  sodium chloride infusion   Intravenous Continuous Delfina Redwood, MD      . acetaminophen (TYLENOL) tablet 650 mg  650 mg Oral Q6H PRN Radene Gunning, NP   650 mg at 08/29/15 0236   Or  . acetaminophen (TYLENOL) suppository 650 mg  650 mg Rectal Q6H PRN Radene Gunning, NP      . benzonatate (TESSALON) capsule 100 mg  100 mg Oral TID PRN Radene Gunning, NP      . fluticasone (FLONASE) 50 MCG/ACT nasal spray 1 spray  1 spray Each Nare Daily Radene Gunning, NP   1 spray at 08/29/15 1012  . guaiFENesin (MUCINEX) 12 hr tablet 600 mg  600 mg Oral Q12H PRN Radene Gunning, NP   600 mg at 08/29/15 0236  . HYDROcodone-acetaminophen (NORCO/VICODIN) 5-325 MG per tablet 1 tablet  1 tablet Oral Q6H PRN Radene Gunning, NP   1 tablet at 08/28/15 2112  . levothyroxine (SYNTHROID, LEVOTHROID) tablet 25 mcg  25 mcg Oral QAC breakfast Radene Gunning, NP   25 mcg at 08/29/15 4315  . LORazepam (ATIVAN) tablet 0.5 mg  0.5 mg Oral BID PRN Radene Gunning, NP   0.5 mg at 08/29/15 0236  . nitroGLYCERIN (NITROSTAT) SL tablet 0.4 mg  0.4 mg Sublingual Q5 min PRN Radene Gunning, NP      . ondansetron (ZOFRAN-ODT) disintegrating tablet 4 mg  4 mg Oral Q4H PRN Radene Gunning, NP   4 mg at 08/29/15 0236  . pantoprazole (PROTONIX) EC tablet 40 mg  40 mg Oral Daily Delfina Redwood, MD   40 mg at 08/29/15 1257  . polyethylene glycol (MIRALAX / GLYCOLAX) packet 17 g  17 g Oral BID Delfina Redwood, MD      . senna-docusate (Senokot-S) tablet 1 tablet  1 tablet Oral BID Delfina Redwood, MD   1 tablet at 08/29/15 1257  . simvastatin (ZOCOR) tablet 10 mg  10 mg Oral Daily Radene Gunning, NP   10 mg at 08/29/15 0957  . sodium  chloride 0.9 % injection 3 mL  3 mL Intravenous Q12H Radene Gunning, NP   3 mL at 08/28/15 2113  . traZODone (DESYREL) tablet 200 mg  200 mg Oral QHS PRN Radene Gunning, NP   200 mg at 08/28/15 2112   Do not use this list as official medication orders. Please verify with discharge summary.  Discharge Medications:   Medication List    TAKE these medications        benzonatate 100 MG capsule  Commonly known as:  TESSALON  Take 100 mg by mouth 3 (three) times daily as needed for cough.     bisoprolol-hydrochlorothiazide 5-6.25 MG tablet  Commonly known as:  ZIAC  Take 1 tablet by mouth daily.     cholecalciferol 1000 UNITS tablet  Commonly known as:  VITAMIN D  Take 1,000 Units by mouth daily.     fluticasone 50 MCG/ACT nasal spray  Commonly known as:  FLONASE  Place 1 spray into the nose daily. allergies     guaiFENesin 600 MG 12 hr tablet  Commonly known as:  MUCINEX  Take 1 tablet (600 mg total) by mouth 2 (two) times daily.     HYDROcodone-acetaminophen 5-325 MG tablet  Commonly known as:  NORCO/VICODIN  Take 1 tablet by mouth every 6 (six) hours as needed for moderate pain.     levothyroxine 25 MCG tablet  Commonly known as:  SYNTHROID, LEVOTHROID  Take 25 mcg by mouth daily before breakfast.     LORazepam 0.5 MG tablet  Commonly known as:  ATIVAN  Take 0.5 mg by mouth 2 (two) times daily as needed for anxiety.     multivitamin with minerals Tabs tablet  Take 1 tablet by mouth daily.     nitroGLYCERIN 0.4 MG SL tablet  Commonly known as:  NITROSTAT  Place 0.4 mg under the tongue every 5 (five) minutes as needed for chest pain.     omeprazole 20 MG capsule  Commonly known as:  PRILOSEC  Take 20 mg by mouth daily.     ondansetron 4 MG disintegrating tablet  Commonly known as:  ZOFRAN ODT  4mg  ODT q4 hours prn nausea/vomit     polyethylene glycol packet  Commonly known as:  MIRALAX / GLYCOLAX  Take 17 g by mouth daily.     promethazine 25 MG tablet   Commonly known as:  PHENERGAN  Take 25 mg  by mouth every 6 (six) hours as needed for nausea or vomiting.     senna 8.6 MG Tabs tablet  Commonly known as:  SENOKOT  Take 3 tablets by mouth at bedtime.     simvastatin 10 MG tablet  Commonly known as:  ZOCOR  Take 10 mg by mouth daily.     traMADol 50 MG tablet  Commonly known as:  ULTRAM  Take 50 mg by mouth 2 (two) times daily.     traZODone 100 MG tablet  Commonly known as:  DESYREL  Take 200 mg by mouth at bedtime as needed for sleep.      ASK your doctor about these medications        aspirin EC 81 MG tablet  Take 81 mg by mouth daily.     furosemide 20 MG tablet  Commonly known as:  LASIX  Take 20 mg by mouth daily as needed for edema.        Relevant Imaging Results:  Relevant Lab Results:  Recent Labs    Additional Information    Hillel Card, Jones Broom, LCSWA

## 2015-08-30 ENCOUNTER — Observation Stay (HOSPITAL_COMMUNITY): Payer: Medicare Other

## 2015-08-30 DIAGNOSIS — K625 Hemorrhage of anus and rectum: Secondary | ICD-10-CM | POA: Diagnosis not present

## 2015-08-30 DIAGNOSIS — R112 Nausea with vomiting, unspecified: Secondary | ICD-10-CM | POA: Diagnosis not present

## 2015-08-30 DIAGNOSIS — K5909 Other constipation: Secondary | ICD-10-CM | POA: Diagnosis not present

## 2015-08-30 LAB — CBC
HEMATOCRIT: 31.6 % — AB (ref 39.0–52.0)
HEMOGLOBIN: 10.7 g/dL — AB (ref 13.0–17.0)
MCH: 33.5 pg (ref 26.0–34.0)
MCHC: 33.9 g/dL (ref 30.0–36.0)
MCV: 99.1 fL (ref 78.0–100.0)
Platelets: 124 10*3/uL — ABNORMAL LOW (ref 150–400)
RBC: 3.19 MIL/uL — ABNORMAL LOW (ref 4.22–5.81)
RDW: 13.3 % (ref 11.5–15.5)
WBC: 4.1 10*3/uL (ref 4.0–10.5)

## 2015-08-30 NOTE — Care Management Note (Signed)
Case Management Note  Patient Details  Name: Andre Mueller MRN: [id number] Date of Birth: 08-Dec-1920  Subjective/Objective:    Patient is from [facility], per CSW note plan is for patient to return to ALF.                  Action/Plan:   Expected Discharge Date:                  Expected Discharge Plan:  Assisted Living / Rest Home  In-House Referral:  Clinical Social Work  Discharge planning Services  CM Consult  Post Acute Care Choice:    Choice offered to:     DME Arranged:    DME Agency:     HH Arranged:    [place] Agency:     Status of Service:  Completed, signed off  Medicare Important Message Given:    Date Medicare IM Given:    Medicare IM give by:    Date Additional Medicare IM Given:    Additional Medicare Important Message give by:     If discussed at [place] of Stay Meetings, dates discussed:    Additional Comments:  Zenon Mayo, RN 08/30/2015, 12:13 PM

## 2015-08-30 NOTE — Progress Notes (Signed)
[facility] PROGRESS NOTE  Andre Mueller CHY:[id number] DOB: 05-02-1921 DOA: 08/28/2015 PCP: London Pepper, MD  Assessment/Plan:  Principal Problem:   BRBPR (bright red blood per rectum): No further. Suspect enema tip trauma v hemorroids. Hemoglobin stable. No need for GI consult at this time, especially given advanced age. Active Problems: Nausea vomiting: none further, but not eating much. Will check kub. R/o obstruction Lethargic today: per RN, was up all night. Need to r/o CVA, bleed. Check CT brain Dehydration: improving. IV fluids   Constipation: Continue Senokot, miralax   GERD (gastroesophageal reflux disease)   Hypertension   CAD (coronary artery disease)   Diverticulosis of large intestine   Anemia   Inguinal hernia   Hernia, inguinal  Code Status:  full Family Communication:  Discussed with son by phone Disposition Plan:  to skilled nursing facility/ALF once tolerating diet.   Consultants:    Procedures:     Antibiotics:    HPI/Subjective: Unable. Per nursing staff, no bleeding, not eating. No vomiting  Objective: Filed Vitals:   08/30/15 0453  BP: 154/69  Pulse: 81  Temp: 98.1 F (36.7 C)  Resp: 20    Intake/Output Summary (Last 24 hours) at 08/30/15 1024 Last data filed at 08/30/15 0656  Gross per 24 hour  Intake 248.33 ml  Output    425 ml  Net -176.67 ml   Filed Weights   08/30/15 0846  Weight: 63.1 kg (139 lb 1.8 oz)    Exam:   General:   Somnolent and difficult to rouse  HEENT: mucous membranes less dry  Cardiovascular:  Regular rate rhythm without murmurs gallops rubs  Respiratory:  clear to auscultation bilaterally without wheezes rhonchi or rales   Abdomen:  soft. Nontender. Bowel sounds present.   Ext: No clubbing cyanosis or edema   Basic Metabolic Panel:  Recent Labs Lab 08/24/15 1339 08/28/15 0809 08/29/15 0354  NA 134* 135 137  K 3.6 4.5 3.6  CL 96* 99* 101  CO2 23 29 23   GLUCOSE 120* 125* 102*   BUN 16 28* 25*  CREATININE 0.68 0.76 0.69  CALCIUM 8.6* 8.7* 8.7*   Liver Function Tests:  Recent Labs Lab 08/24/15 1339 08/28/15 0809  AST 19 37  ALT 12* 13*  ALKPHOS 52 47  BILITOT 1.3* 1.4*  PROT 8.7* 8.6*  ALBUMIN 2.9* 2.7*    Recent Labs Lab 08/28/15 0809  LIPASE 21   No results for input(s): AMMONIA in the last 168 hours. CBC:  Recent Labs Lab 08/24/15 1339 08/28/15 0809 08/28/15 1300 08/28/15 1852 08/29/15 0354 08/30/15 0532  WBC 7.1 4.7 3.9* 3.9* 3.8* 4.1  NEUTROABS 4.7 2.9  --   --   --   --   HGB 11.6* 10.8* 10.6* 10.4* 10.8* 10.7*  HCT 33.9* 31.8* 30.7* 30.9* 32.1* 31.6*  MCV 98.3 99.7 98.7 99.7 100.0 99.1  PLT 124* 133* 118* 117* 96* 124*   Cardiac Enzymes: No results for input(s): CKTOTAL, CKMB, CKMBINDEX, TROPONINI in the last 168 hours. BNP (last 3 results) No results for input(s): BNP in the last 8760 hours.  ProBNP (last 3 results) No results for input(s): PROBNP in the last 8760 hours.  CBG: No results for input(s): GLUCAP in the last 168 hours.  No results found for this or any previous visit (from the past 240 hour(s)).   Studies: Ct Abdomen Pelvis W Contrast  08/28/2015  CLINICAL DATA:  Nausea vomiting and rectal bleeding. EXAM: CT ABDOMEN AND PELVIS WITH CONTRAST TECHNIQUE: Multidetector CT imaging  of the abdomen and pelvis was performed using the standard protocol following bolus administration of intravenous contrast. CONTRAST:  171mL OMNIPAQUE IOHEXOL 300 MG/ML  SOLN COMPARISON:  CT 08/24/2015 FINDINGS: Lower chest: Mild increase in RIGHT pleural effusion. Small LEFT effusion stable. Mild basilar atelectasis. Dense calcification of the RIGHT hemidiaphragm Hepatobiliary: No focal hepatic lesion. Gallbladder is not identified. There is mild intrahepatic and extrahepatic duct dilatation. The common bile duct measures upper limits of normal at 6 mm. Pancreas: Pancreatic atrophy present. No pancreatic mass or duct dilatation. Spleen:  Normal spleen Adrenals/urinary tract: Adrenal glands and kidneys are normal. The ureters and bladder normal. Stomach/Bowel: Stomach, small-bowel normal. There is a RIGHT inguinal hernia which contains a long segment of nonobstructed small bowel. This not changed comparison exam. The colon is distended mildly and stool-filled. The rectum has fluid stool and is mildly distended. Vascular/Lymphatic: Abdominal aorta is normal caliber with atherosclerotic calcification. There is no retroperitoneal or periportal lymphadenopathy. No pelvic lymphadenopathy. Reproductive: Prostate is enlarged to 50  mm Musculoskeletal: No aggressive osseous lesion. Other: No free fluid. IMPRESSION: 1. Moderate volume stool throughout the colon which is partially formed and without obstructing lesion. 2. Large RIGHT inguinal hernia contains a long segment of nonobstructed small bowel, not changed. 3. Mild intrahepatic and extrahepatic biliary duct dilatation without obstructing lesion identified. 4. Intrarenal increase in RIGHT pleural effusion which is small to moderate. Electronically Signed   By: Suzy Bouchard M.D.   On: 08/28/2015 11:12    Scheduled Meds: . fluticasone  1 spray Each Nare Daily  . levothyroxine  25 mcg Oral QAC breakfast  . pantoprazole  40 mg Oral Daily  . polyethylene glycol  17 g Oral BID  . senna-docusate  1 tablet Oral BID  . simvastatin  10 mg Oral Daily  . sodium chloride  3 mL Intravenous Q12H   Continuous Infusions: . sodium chloride 100 mL/hr at 08/30/15 0341    Time spent: 35 minutes  [facility]  [email or web address], password [email or web address] 08/30/2015, 10:24 AM

## 2015-08-31 ENCOUNTER — Observation Stay (HOSPITAL_COMMUNITY): Payer: Medicare Other

## 2015-08-31 DIAGNOSIS — K625 Hemorrhage of anus and rectum: Secondary | ICD-10-CM

## 2015-08-31 DIAGNOSIS — R112 Nausea with vomiting, unspecified: Secondary | ICD-10-CM

## 2015-08-31 DIAGNOSIS — K5909 Other constipation: Secondary | ICD-10-CM | POA: Diagnosis not present

## 2015-08-31 DIAGNOSIS — D649 Anemia, unspecified: Secondary | ICD-10-CM | POA: Diagnosis not present

## 2015-08-31 DIAGNOSIS — G9340 Encephalopathy, unspecified: Secondary | ICD-10-CM | POA: Diagnosis not present

## 2015-08-31 LAB — CBC
HCT: 33.5 % — ABNORMAL LOW (ref 39.0–52.0)
HEMOGLOBIN: 11.2 g/dL — AB (ref 13.0–17.0)
MCH: 33.4 pg (ref 26.0–34.0)
MCHC: 33.4 g/dL (ref 30.0–36.0)
MCV: 100 fL (ref 78.0–100.0)
PLATELETS: 128 10*3/uL — AB (ref 150–400)
RBC: 3.35 MIL/uL — ABNORMAL LOW (ref 4.22–5.81)
RDW: 13.3 % (ref 11.5–15.5)
WBC: 4.1 10*3/uL (ref 4.0–10.5)

## 2015-08-31 LAB — BASIC METABOLIC PANEL
ANION GAP: 13 (ref 5–15)
BUN: 24 mg/dL — ABNORMAL HIGH (ref 6–20)
CALCIUM: 8.5 mg/dL — AB (ref 8.9–10.3)
CO2: 27 mmol/L (ref 22–32)
Chloride: 100 mmol/L — ABNORMAL LOW (ref 101–111)
Creatinine, Ser: 0.69 mg/dL (ref 0.61–1.24)
GFR calc Af Amer: >60 mL/min (ref >60)
GLUCOSE: 97 mg/dL (ref 65–99)
Potassium: 2.6 mmol/L — CL (ref 3.5–5.1)
Sodium: 140 mmol/L (ref 135–145)

## 2015-08-31 MED ORDER — POTASSIUM CHLORIDE CRYS ER 20 MEQ PO TBCR
40.0000 meq | EXTENDED_RELEASE_TABLET | Freq: Once | ORAL | Status: AC
  Administered 2015-08-31: 40 meq via ORAL
  Filled 2015-08-31: qty 2

## 2015-08-31 MED ORDER — TRAMADOL HCL 50 MG PO TABS
25.0000 mg | ORAL_TABLET | Freq: Four times a day (QID) | ORAL | Status: DC | PRN
  Administered 2015-08-31: 25 mg via ORAL
  Filled 2015-08-31: qty 1

## 2015-08-31 MED ORDER — GADOBENATE DIMEGLUMINE 529 MG/ML IV SOLN
13.0000 mL | Freq: Once | INTRAVENOUS | Status: AC | PRN
  Administered 2015-08-31: 13 mL via INTRAVENOUS

## 2015-08-31 MED ORDER — POTASSIUM CHLORIDE 10 MEQ/100ML IV SOLN
10.0000 meq | INTRAVENOUS | Status: AC
  Administered 2015-08-31 (×5): 10 meq via INTRAVENOUS
  Filled 2015-08-31 (×5): qty 100

## 2015-08-31 MED ORDER — POTASSIUM CHLORIDE 10 MEQ/100ML IV SOLN
10.0000 meq | Freq: Once | INTRAVENOUS | Status: AC
  Administered 2015-08-31: 10 meq via INTRAVENOUS
  Filled 2015-08-31: qty 100

## 2015-08-31 MED ORDER — ENSURE ENLIVE PO LIQD: 237.0000 mL | Freq: Two times a day (BID) | ORAL | Status: DC

## 2015-08-31 NOTE — Progress Notes (Signed)
CRITICAL VALUE ALERT  Critical value received:  K+ 2.6  Date of notification:  08/31/15  Time of notification:  0627  Critical value read back:Yes.    Nurse who received alert:  Amaryllis Dyke  MD notified (1st page):  Schorr, NP  Time of first page:  [phone]  MD notified (2nd page):  Time of second page:  Responding MD:  Hilbert Bible, NP  Time MD responded:  [phone]

## 2015-08-31 NOTE — NC FL2 (Signed)
[place] LEVEL OF CARE SCREENING TOOL     IDENTIFICATION  Patient Name: Andre Mueller Birthdate: Oct 02, 1921 Sex: male Admission Date (Current Location): 08/28/2015  [facility] and [place] Number: Herbalist and Address:  [facility], [place] [address] [place], [place] [address]      Provider Number: [id number]  Attending Physician Name and Address:  Caren Griffins, MD  Relative Name and Phone Number:       Current Level of Care: Hospital Recommended Level of Care: [place] Prior Approval Number:    Date Approved/Denied:   PASRR Number:    Discharge Plan: Other (Comment) ([facility])    Current Diagnoses: Patient Active Problem List   Diagnosis Date Noted  . Nausea with vomiting 08/29/2015  . BRBPR (bright red blood per rectum) 08/28/2015  . Hernia, inguinal 08/28/2015  . Inguinal hernia   . Bleeding gastrointestinal   . Hip fracture, left ([place]) 07/19/2013  . Weakness 03/04/2013  . Anemia 03/04/2013  . Osteoporosis, senile   . GI (gastrointestinal bleed) 05/06/2012  . Diverticulosis of large intestine 05/06/2012  . Benign neoplasm of colon 02/29/2012  . Constipation 02/26/2012  . History of resection of small bowel 02/26/2012  . GERD (gastroesophageal reflux disease) 02/26/2012  . Hypertension 02/26/2012  . Hyperlipidemia 02/26/2012  . CAD (coronary artery disease) 02/26/2012  . Glaucoma 02/26/2012    Orientation ACTIVITIES/SOCIAL BLADDER RESPIRATION    Self, Place    Incontinent    BEHAVIORAL SYMPTOMS/MOOD NEUROLOGICAL BOWEL NUTRITION STATUS      Incontinent    PHYSICIAN VISITS COMMUNICATION OF NEEDS Height & Weight Skin    Verbally   150 lbs. Surgical wounds          AMBULATORY STATUS RESPIRATION    Supervision limited        Personal Care Assistance Level of Assistance  Dressing, Bathing Bathing Assistance: Maximum assistance   Dressing Assistance: Maximum assistance       Functional Limitations Info                SPECIAL CARE FACTORS FREQUENCY                      Additional Factors Info                  Current Medications (08/31/2015): Current Facility-Administered Medications  Medication Dose Route Frequency Provider Last Rate Last Dose  . 0.9 %  sodium chloride infusion   Intravenous Continuous Delfina Redwood, MD 100 mL/hr at 08/31/15 0606    . acetaminophen (TYLENOL) tablet 650 mg  650 mg Oral Q6H PRN Radene Gunning, NP   650 mg at 08/29/15 0236   Or  . acetaminophen (TYLENOL) suppository 650 mg  650 mg Rectal Q6H PRN Radene Gunning, NP      . benzonatate (TESSALON) capsule 100 mg  100 mg Oral TID PRN Radene Gunning, NP      . fluticasone (FLONASE) 50 MCG/ACT nasal spray 1 spray  1 spray Each Nare Daily Radene Gunning, NP   1 spray at 08/29/15 1012  . guaiFENesin (MUCINEX) 12 hr tablet 600 mg  600 mg Oral Q12H PRN Radene Gunning, NP   600 mg at 08/29/15 0236  . levothyroxine (SYNTHROID, LEVOTHROID) tablet 25 mcg  25 mcg Oral QAC breakfast Radene Gunning, NP   25 mcg at 08/31/15 0831  . LORazepam (ATIVAN) tablet 0.5 mg  0.5 mg Oral BID PRN Radene Gunning, NP   0.5 mg at 08/29/15 0236  . nitroGLYCERIN (NITROSTAT) SL tablet 0.4 mg  0.4 mg Sublingual Q5 min PRN Radene Gunning, NP      . ondansetron (ZOFRAN-ODT) disintegrating tablet 4 mg  4 mg Oral Q4H PRN Radene Gunning, NP   4 mg at 08/29/15 0236  . pantoprazole (PROTONIX) EC tablet 40 mg  40 mg Oral Daily Delfina Redwood, MD   40 mg at 08/29/15 1257  . polyethylene glycol (MIRALAX / GLYCOLAX) packet 17 g  17 g Oral BID Delfina Redwood, MD   17 g at 08/30/15 2125  . potassium chloride 10 mEq in 100 mL IVPB  10 mEq Intravenous Q1 Hr x 6 Jeryl Columbia, NP   10 mEq at 08/31/15 1004  . senna-docusate (Senokot-S) tablet 1 tablet  1 tablet Oral BID Delfina Redwood, MD   1 tablet at 08/30/15 2125  . simvastatin (ZOCOR) tablet 10 mg  10 mg Oral Daily Radene Gunning, NP   10 mg at  08/29/15 0957  . sodium chloride 0.9 % injection 3 mL  3 mL Intravenous Q12H Radene Gunning, NP   3 mL at 08/29/15 2338   Do not use this list as official medication orders. Please verify with discharge summary.  Discharge Medications:   Medication List    TAKE these medications        benzonatate 100 MG capsule  Commonly known as:  TESSALON  Take 100 mg by mouth 3 (three) times daily as needed for cough.     bisoprolol-hydrochlorothiazide 5-6.25 MG tablet  Commonly known as:  ZIAC  Take 1 tablet by mouth daily.     cholecalciferol 1000 UNITS tablet  Commonly known as:  VITAMIN D  Take 1,000 Units by mouth daily.     fluticasone 50 MCG/ACT nasal spray  Commonly known as:  FLONASE  Place 1 spray into the nose daily. allergies     guaiFENesin 600 MG 12 hr tablet  Commonly known as:  MUCINEX  Take 1 tablet (600 mg total) by mouth 2 (two) times daily.     HYDROcodone-acetaminophen 5-325 MG tablet  Commonly known as:  NORCO/VICODIN  Take 1 tablet by mouth every 6 (six) hours as needed for moderate pain.     levothyroxine 25 MCG tablet  Commonly known as:  SYNTHROID, LEVOTHROID  Take 25 mcg by mouth daily before breakfast.     LORazepam 0.5 MG tablet  Commonly known as:  ATIVAN  Take 0.5 mg by mouth 2 (two) times daily as needed for anxiety.     multivitamin with minerals Tabs tablet  Take 1 tablet by mouth daily.     nitroGLYCERIN 0.4 MG SL tablet  Commonly known as:  NITROSTAT  Place 0.4 mg under the tongue every 5 (five) minutes as needed for chest pain.     omeprazole 20 MG capsule  Commonly known as:  PRILOSEC  Take 20 mg by mouth daily.     ondansetron 4 MG disintegrating tablet  Commonly known as:  ZOFRAN ODT  4mg  ODT q4 hours prn nausea/vomit     polyethylene glycol packet  Commonly known as:  MIRALAX / GLYCOLAX  Take 17 g by mouth daily.     promethazine 25 MG tablet  Commonly known as:  PHENERGAN  Take 25 mg by mouth every 6 (six) hours as needed for  nausea or vomiting.  senna 8.6 MG Tabs tablet  Commonly known as:  SENOKOT  Take 3 tablets by mouth at bedtime.     simvastatin 10 MG tablet  Commonly known as:  ZOCOR  Take 10 mg by mouth daily.     traMADol 50 MG tablet  Commonly known as:  ULTRAM  Take 50 mg by mouth 2 (two) times daily.     traZODone 100 MG tablet  Commonly known as:  DESYREL  Take 200 mg by mouth at bedtime as needed for sleep.      ASK your doctor about these medications        aspirin EC 81 MG tablet  Take 81 mg by mouth daily.     furosemide 20 MG tablet  Commonly known as:  LASIX  Take 20 mg by mouth daily as needed for edema.        Relevant Imaging Results:  Relevant Lab Results:  Recent Labs    Additional Information    Skylene Deremer, Jones Broom, LCSWA

## 2015-08-31 NOTE — Progress Notes (Signed)
Andre Mueller is continuing to have right hip pain not relieved by tylenol. Text paged Dr. Cruzita Lederer to see he get something else for pain?  Will continue to monitor.  Alphonzo Lemmings, RN

## 2015-08-31 NOTE — Progress Notes (Signed)
Initial Nutrition Assessment  DOCUMENTATION CODES:   Not applicable  INTERVENTION:   -Ensure Enlive po BID, each supplement provides 350 kcal and 20 grams of protein  NUTRITION DIAGNOSIS:   Inadequate oral intake related to poor appetite as evidenced by meal completion < 25%.  GOAL:   Patient will meet greater than or equal to 90% of their needs  MONITOR:   PO intake, Supplement acceptance, Weight trends, Labs, Skin, I & O's  REASON FOR ASSESSMENT:   Consult Poor PO  ASSESSMENT:   Andre Mueller is a very pleasant 81 y.o. male with past medical history that includes GERD, CAD, diverticulosis, hypertension presents emergency Department chief complaint of abdominal pain. Initial evaluation in the emergency department revealed bright red blood per rectum acute on chronic anemia in the setting of constipation.  Pt admitted with BRBPR (per MD notes, likely due to enema tip trauma). He is a resident of [facility].   Pt seen per request of RN, due to poor po intake.   Spoke with pt, who was very agitated at time of visit. He reports his appetite is good, but is not eating much due to pain. Noted lunch tray on bedside table was unattempted other than pt consuming one bite of a breadstick. Meal completion 0-25%. He reports he is not eating because of "this tube in my penis. I'll eat if you take it out".   He suspects he may have lost weight, but unable to provide further details. Noted an 11# (7.3%) wt loss within the past 4 months. UBW around 170#, per chart review.   He reports he has drank Ensure "on and off" in the past. RD will order to optimize nutritional intake.  Nutrition-Focused physical exam completed. Findings are mild fat depletion, mild muscle depletion, and no edema. It is difficult to determine if fat and muscle loss is related to compromised nutritional status vs advanced age vs limited mobility.   Labs reviewed: K: 2.6 (on supplement).   Diet Order:  Diet  Heart Room service appropriate?: Yes; Fluid consistency:: Thin  Skin:  Reviewed, no issues  Last BM:  08/30/15  Height:   Ht Readings from Last 1 Encounters:  08/30/15 5\' 7"  (1.702 m)    Weight:   Wt Readings from Last 1 Encounters:  08/30/15 139 lb 1.8 oz (63.1 kg)    Ideal Body Weight:  67.3 kg  BMI:  Body mass index is 21.78 kg/(m^2).  Estimated Nutritional Needs:   Kcal:  1600-1800  Protein:  70-85 grams  Fluid:  1.6-1.8 L  EDUCATION NEEDS:   No education needs identified at this time  Dannell Gortney A. Jimmye Norman, RD, LDN, CDE Pager: [phone] After hours Pager: [phone]

## 2015-08-31 NOTE — Progress Notes (Signed)
PT Cancellation Note  Patient Details Name: Andre Mueller MRN: [id number] DOB: 07-Mar-1921   Cancelled Treatment:    Reason Eval/Treat Not Completed: Pain limiting ability to participate;Fatigue/lethargy limiting ability to participate;Patient declined, no reason specified. Attemped to have patient work with therapy and transfer out of bed. Patient adamantly refusing and requesting to have coffee again and again. Per NT, she has been unable to transfer patient due to his refusal as well. Attempted to educate patient of importance of mobility and stated would get some coffee after sitting up in recliner, however, patient continue to refused. Repositioned patient higher up in bed per his request. RN made aware   [place], Tonia Brooms 08/31/2015, 12:17 PM

## 2015-08-31 NOTE — Progress Notes (Signed)
PROGRESS NOTE  Andre Mueller WUJ:[id number] DOB: 23-Oct-1921 DOA: 08/28/2015 PCP: London Pepper, MD  HPI: 81 yo M admitted with transient rectal bleeding after self administering an enema  Subjective / 24 H Interval events - no complaints this morning, keeps eyes closed throughout our encounter  Assessment/Plan: Principal Problem:   BRBPR (bright red blood per rectum) Active Problems:   Constipation   GERD (gastroesophageal reflux disease)   Hypertension   CAD (coronary artery disease)   Diverticulosis of large intestine   Anemia   Inguinal hernia   Hernia, inguinal   Bleeding gastrointestinal   Nausea with vomiting   BRBPR (bright red blood per rectum) - resolved, suspect enema tip trauma. Hemoglobin stable. No need for GI consult at this time, especially given advanced age.  Acute encephalopathy  - per son, he was very alert, talkative up until he was hospitalized, now he seems confused. - obtain MRI  Nausea vomiting - resolved. On admission, started on scheduled tramadol. May be contributing.  - will continue given ongoing hip pain, limit use  Constipation  - likely contributing as well to nausea/vomiting.   Dehydration - eating well today, stop IV fluids  GERD (gastroesophageal reflux disease) Hypertension CAD (coronary artery disease) Diverticulosis of large intestine Anemia Inguinal hernia   Diet: Diet Heart Room service appropriate?: Yes; Fluid consistency:: Thin Fluids: none  DVT Prophylaxis: SCD  Code Status: Full Code Family Communication: discussed with son over the phone Disposition Plan: home when ready  Barriers to discharge: hypokalemia  Consultants:  None   Procedures:  None    Antibiotics  Anti-infectives    None       Studies  Dg Abd 1 View  08/30/2015  CLINICAL DATA:  81 year old male with failure to thrive and constipation. EXAM: ABDOMEN - 1 VIEW COMPARISON:  08/28/2015 and prior exams FINDINGS: The bowel gas pattern  is unremarkable. No dilated bowel loops are present. No suspicious calcifications are identified. Surgical hardware within the proximal left femur and a lower thoracic spine compression fracture and vertebral augmentation changes again noted. IMPRESSION: No acute abnormalities.  Normal bowel gas pattern. Electronically Signed   By: Margarette Canada M.D.   On: 08/30/2015 13:21   Ct Head Wo Contrast  08/30/2015  CLINICAL DATA:  Lethargy. EXAM: CT HEAD WITHOUT CONTRAST TECHNIQUE: Contiguous axial images were obtained from the base of the skull through the vertex without intravenous contrast. COMPARISON:  CT scan of May 12, 2013. FINDINGS: Bony calvarium appears intact. Moderate diffuse cortical atrophy is noted. Moderate chronic ischemic white matter disease is noted. No mass effect or midline shift is noted. Ventricular size is within normal limits. There is no evidence of mass lesion, hemorrhage or acute infarction. IMPRESSION: Moderate diffuse cortical atrophy. Moderate chronic ischemic white matter disease. No acute intracranial abnormality seen. Electronically Signed   By: Marijo Conception, M.D.   On: 08/30/2015 13:07    Objective  Filed Vitals:   08/30/15 1310 08/30/15 2206 08/31/15 0520 08/31/15 0522  BP: 148/55 139/63 165/72 160/75  Pulse: 71 79 84 88  Temp: 97.6 F (36.4 C) 98.6 F (37 C) 98.6 F (37 C) 98.6 F (37 C)  TempSrc: Oral Oral Oral Oral  Resp: 20 20 18 18   Height:      Weight:      SpO2: 98% 93% 94% 96%    Intake/Output Summary (Last 24 hours) at 08/31/15 1236 Last data filed at 08/31/15 0933  Gross per 24 hour  Intake 3841.67 ml  Output   1000 ml  Net 2841.67 ml   Filed Weights   08/30/15 0846  Weight: 63.1 kg (139 lb 1.8 oz)    Exam:  GENERAL: NAD  HEENT: head NCAT, no scleral icterus. Pupils round and reactive. Mucous membranes are moist. Posterior pharynx clear of any exudate or lesions.  NECK: Supple. No LAD  LUNGS: Clear to auscultation. No wheezing or  crackles  HEART: Regular rate and rhythm without murmur. 2+ pulses, no JVD, no peripheral edema  ABDOMEN: Soft, non-distended, non-tender. Positive bowel sounds.  EXTREMITIES: Without any cyanosis or clubbing. Good muscle tone  NEUROLOGIC: Alert and oriented x3. Cranial nerves II through XII are grossly intact. Strength 5/5 in all 4.   Data Reviewed: Basic Metabolic Panel:  Recent Labs Lab 08/24/15 1339 08/28/15 0809 08/29/15 0354 08/31/15 0500  NA 134* 135 137 140  K 3.6 4.5 3.6 2.6*  CL 96* 99* 101 100*  CO2 23 29 23 27   GLUCOSE 120* 125* 102* 97  BUN 16 28* 25* 24*  CREATININE 0.68 0.76 0.69 0.69  CALCIUM 8.6* 8.7* 8.7* 8.5*   Liver Function Tests:  Recent Labs Lab 08/24/15 1339 08/28/15 0809  AST 19 37  ALT 12* 13*  ALKPHOS 52 47  BILITOT 1.3* 1.4*  PROT 8.7* 8.6*  ALBUMIN 2.9* 2.7*    Recent Labs Lab 08/28/15 0809  LIPASE 21   CBC:  Recent Labs Lab 08/24/15 1339 08/28/15 0809 08/28/15 1300 08/28/15 1852 08/29/15 0354 08/30/15 0532 08/31/15 0500  WBC 7.1 4.7 3.9* 3.9* 3.8* 4.1 4.1  NEUTROABS 4.7 2.9  --   --   --   --   --   HGB 11.6* 10.8* 10.6* 10.4* 10.8* 10.7* 11.2*  HCT 33.9* 31.8* 30.7* 30.9* 32.1* 31.6* 33.5*  MCV 98.3 99.7 98.7 99.7 100.0 99.1 100.0  PLT 124* 133* 118* 117* 96* 124* 128*    Scheduled Meds: . fluticasone  1 spray Each Nare Daily  . levothyroxine  25 mcg Oral QAC breakfast  . pantoprazole  40 mg Oral Daily  . polyethylene glycol  17 g Oral BID  . potassium chloride  10 mEq Intravenous Q1 Hr x 6  . senna-docusate  1 tablet Oral BID  . simvastatin  10 mg Oral Daily  . sodium chloride  3 mL Intravenous Q12H   Continuous Infusions: . sodium chloride 100 mL/hr at 08/31/15 0606    Marzetta Board, MD [facility] Pager [phone]. If 7 PM - 7 AM, please contact night-coverage at [email or web address], password [facility] 08/31/2015, 12:36 PM

## 2015-09-01 DIAGNOSIS — K625 Hemorrhage of anus and rectum: Secondary | ICD-10-CM | POA: Diagnosis not present

## 2015-09-01 DIAGNOSIS — K4090 Unilateral inguinal hernia, without obstruction or gangrene, not specified as recurrent: Secondary | ICD-10-CM

## 2015-09-01 DIAGNOSIS — K5909 Other constipation: Secondary | ICD-10-CM | POA: Diagnosis not present

## 2015-09-01 DIAGNOSIS — D649 Anemia, unspecified: Secondary | ICD-10-CM | POA: Diagnosis not present

## 2015-09-01 DIAGNOSIS — I10 Essential (primary) hypertension: Secondary | ICD-10-CM

## 2015-09-01 LAB — BASIC METABOLIC PANEL
ANION GAP: 12 (ref 5–15)
BUN: 12 mg/dL (ref 6–20)
CHLORIDE: 99 mmol/L — AB (ref 101–111)
CO2: 24 mmol/L (ref 22–32)
Calcium: 8.2 mg/dL — ABNORMAL LOW (ref 8.9–10.3)
Creatinine, Ser: 0.52 mg/dL — ABNORMAL LOW (ref 0.61–1.24)
GFR calc Af Amer: >60 mL/min (ref >60)
GLUCOSE: 106 mg/dL — AB (ref 65–99)
POTASSIUM: 3 mmol/L — AB (ref 3.5–5.1)
Sodium: 135 mmol/L (ref 135–145)

## 2015-09-01 MED ORDER — POLYETHYLENE GLYCOL 3350 17 G PO PACK: 17.0000 g | PACK | Freq: Two times a day (BID) | ORAL | Status: AC

## 2015-09-01 MED ORDER — POTASSIUM CHLORIDE CRYS ER 10 MEQ PO TBCR
EXTENDED_RELEASE_TABLET | ORAL | Status: AC
Start: 2015-09-01 — End: 2015-09-01
  Administered 2015-09-01: 10 meq
  Filled 2015-09-01: qty 4

## 2015-09-01 MED ORDER — POTASSIUM CHLORIDE 20 MEQ PO PACK
40.0000 meq | PACK | ORAL | Status: AC
  Administered 2015-09-01 (×2): 40 meq via ORAL
  Filled 2015-09-01 (×2): qty 2

## 2015-09-01 MED ORDER — ENSURE ENLIVE PO LIQD: 237.0000 mL | Freq: Two times a day (BID) | ORAL | Status: AC

## 2015-09-01 NOTE — Discharge Instructions (Signed)
Follow with London Pepper, MD in 5-7 days  Please get a complete blood count and chemistry panel checked by your Primary MD at your next visit, and again as instructed by your Primary MD. Please get your medications reviewed and adjusted by your Primary MD.  Please request your Primary MD to go over all Hospital Tests and Procedure/Radiological results at the follow up, please get all Hospital records sent to your Prim MD by signing hospital release before you go home.  If you had Pneumonia of Lung problems at the Hospital: Please get a 2 view Chest X ray done in 6-8 weeks after hospital discharge or sooner if instructed by your Primary MD.  If you have Congestive Heart Failure: Please call your Cardiologist or Primary MD anytime you have any of the following symptoms:  1) 3 pound weight gain in 24 hours or 5 pounds in 1 week  2) shortness of breath, with or without a dry hacking cough  3) swelling in the hands, feet or stomach  4) if you have to sleep on extra pillows at night in order to breathe  Follow cardiac low salt diet and 1.5 lit/day fluid restriction.  If you have diabetes Accuchecks 4 times/day, Once in AM empty stomach and then before each meal. Log in all results and show them to your primary doctor at your next visit. If any glucose reading is under 80 or above 300 call your primary MD immediately.  If you have Seizure/Convulsions/Epilepsy: Please do not drive, operate heavy machinery, participate in activities at heights or participate in high speed sports until you have seen by Primary MD or a Neurologist and advised to do so again.  If you had Gastrointestinal Bleeding: Please ask your Primary MD to check a complete blood count within one week of discharge or at your next visit. Your endoscopic/colonoscopic biopsies that are pending at the time of discharge, will also need to followed by your Primary MD.  Get Medicines reviewed and adjusted. Please take all your  medications with you for your next visit with your Primary MD  Please request your Primary MD to go over all hospital tests and procedure/radiological results at the follow up, please ask your Primary MD to get all Hospital records sent to his/her office.  If you experience worsening of your admission symptoms, develop shortness of breath, life threatening emergency, suicidal or homicidal thoughts you must seek medical attention immediately by calling 911 or calling your MD immediately  if symptoms less severe.  You must read complete instructions/literature along with all the possible adverse reactions/side effects for all the Medicines you take and that have been prescribed to you. Take any new Medicines after you have completely understood and accpet all the possible adverse reactions/side effects.   Do not drive or operate heavy machinery when taking Pain medications.   Do not take more than prescribed Pain, Sleep and Anxiety Medications  Special Instructions: If you have smoked or chewed Tobacco  in the last 2 yrs please stop smoking, stop any regular Alcohol  and or any Recreational drug use.  Wear Seat belts while driving.  Please note You were cared for by a hospitalist during your hospital stay. If you have any questions about your discharge medications or the care you received while you were in the hospital after you are discharged, you can call the unit and asked to speak with the hospitalist on call if the hospitalist that took care of you is not available. Once  you are discharged, your primary care physician will handle any further medical issues. Please note that NO REFILLS for any discharge medications will be authorized once you are discharged, as it is imperative that you return to your primary care physician (or establish a relationship with a primary care physician if you do not have one) for your aftercare needs so that they can reassess your need for medications and monitor your  lab values.  You can reach the hospitalist office at phone [phone] or fax [phone]   If you do not have a primary care physician, you can call [phone] for a physician referral.  Activity: As tolerated with Full fall precautions use walker/cane & assistance as needed  Diet: regular  Disposition Home

## 2015-09-01 NOTE — Clinical Social Work Note (Signed)
Patient to be discharged to [facility]. [name] son updated regarding discharge.  Facility: [facility] RN report number: [phone] Transportation: EMS  Lubertha Sayres, [place] [phone]) and Surgical [phone])

## 2015-09-01 NOTE — Clinical Social Work Note (Signed)
Patient to return to [facility] today. Awaiting confirmation of patient's return from ALF admissions liaison.  CSW to update RN and patient's son once discharge plan finalized.  Lubertha Sayres, [place] Clinical Social Work Department Orthopedics [phone]) and Surgical [phone])

## 2015-09-01 NOTE — Progress Notes (Signed)
09/01/15 Patient going to facility this afternoon, IV site removed and discharge completed.

## 2015-09-01 NOTE — Discharge Summary (Signed)
Physician Discharge Summary  Andre Mueller XBW:[id number] DOB: 18-Oct-1921 DOA: 08/28/2015  PCP: London Pepper, MD  Admit date: 08/28/2015 Discharge date: 09/01/2015  Time spent: > 30 minutes  Recommendations for Outpatient Follow-up:  1. Follow up with PCP in 1 week   Discharge Diagnoses:  Principal Problem:   BRBPR (bright red blood per rectum) Active Problems:   Constipation   GERD (gastroesophageal reflux disease)   Hypertension   CAD (coronary artery disease)   Diverticulosis of large intestine   Anemia   Inguinal hernia   Hernia, inguinal   Bleeding gastrointestinal   Nausea with vomiting  Discharge Condition: stable  Diet recommendation: regular  Filed Weights   08/30/15 0846  Weight: 63.1 kg (139 lb 1.8 oz)    History of present illness:  See H&P, Labs, Consult and Test reports for all details in brief, patient is a 81 yo M who was admitted on 10/30 with one episode of bright red blood per rectum, long-standing history of constipation and after self administering an enema.  Hospital Course:   BRBPR (bright red blood per rectum) - resolved, suspect enema tip trauma. Hemoglobin stable. Bleeding has resolved.  Acute encephalopathy - per son, he was very alert, talkative up until he was hospitalized, had transient AMS in the hospital. MRI brain without acute findings. Nausea vomiting - resolved. On admission, started on scheduled tramadol. May be contributing, may consider d/c if nausea/vomiting persist. No further symptoms while hospitalized.  Constipation - likely contributing as well to nausea/vomiting, having normal BMs with Miralax. Continue. Dehydration - eating well today, stop IV fluids Hypokalemia - improved with supplementation, please recheck a BMp in 3-4 days.   Procedures:  None    Consultations:  None   Discharge Exam: Filed Vitals:   08/31/15 0522 08/31/15 1242 08/31/15 2146 09/01/15 0627  BP: 160/75 166/72 165/87 152/72  Pulse: 88 89 88  89  Temp: 98.6 F (37 C) 98.2 F (36.8 C) 98.3 F (36.8 C) 98.4 F (36.9 C)  TempSrc: Oral Oral Oral Oral  Resp: 18 20 18 18   Height:      Weight:      SpO2: 96% 97% 98% 98%    General: NAD Cardiovascular: RRR Respiratory: CTA biL  Discharge Instructions Activity:  As tolerated   Get Medicines reviewed and adjusted: Please take all your medications with you for your next visit with your Primary MD  Please request your Primary MD to go over all hospital tests and procedure/radiological results at the follow up, please ask your Primary MD to get all Hospital records sent to his/her office.  If you experience worsening of your admission symptoms, develop shortness of breath, life threatening emergency, suicidal or homicidal thoughts you must seek medical attention immediately by calling 911 or calling your MD immediately if symptoms less severe.  You must read complete instructions/literature along with all the possible adverse reactions/side effects for all the Medicines you take and that have been prescribed to you. Take any new Medicines after you have completely understood and accpet all the possible adverse reactions/side effects.   Do not drive when taking Pain medications.   Do not take more than prescribed Pain, Sleep and Anxiety Medications  Special Instructions: If you have smoked or chewed Tobacco in the last 2 yrs please stop smoking, stop any regular Alcohol and or any Recreational drug use.  Wear Seat belts while driving.  Please note  You were cared for by a hospitalist during your hospital stay. Once  you are discharged, your primary care physician will handle any further medical issues. Please note that NO REFILLS for any discharge medications will be authorized once you are discharged, as it is imperative that you return to your primary care physician (or establish a relationship with a primary care physician if you do not have one) for your aftercare needs  so that they can reassess your need for medications and monitor your lab values.    Medication List    TAKE these medications        aspirin EC 81 MG tablet  Take 81 mg by mouth daily.     benzonatate 100 MG capsule  Commonly known as:  TESSALON  Take 100 mg by mouth 3 (three) times daily as needed for cough.     bisoprolol-hydrochlorothiazide 5-6.25 MG tablet  Commonly known as:  ZIAC  Take 1 tablet by mouth daily.     cholecalciferol 1000 UNITS tablet  Commonly known as:  VITAMIN D  Take 1,000 Units by mouth daily.     feeding supplement (ENSURE ENLIVE) Liqd  Take 237 mLs by mouth 2 (two) times daily between meals.     fluticasone 50 MCG/ACT nasal spray  Commonly known as:  FLONASE  Place 1 spray into the nose daily. allergies     furosemide 20 MG tablet  Commonly known as:  LASIX  Take 20 mg by mouth daily as needed for edema.     guaiFENesin 600 MG 12 hr tablet  Commonly known as:  MUCINEX  Take 1 tablet (600 mg total) by mouth 2 (two) times daily.     HYDROcodone-acetaminophen 5-325 MG tablet  Commonly known as:  NORCO/VICODIN  Take 1 tablet by mouth every 6 (six) hours as needed for moderate pain.     levothyroxine 25 MCG tablet  Commonly known as:  SYNTHROID, LEVOTHROID  Take 25 mcg by mouth daily before breakfast.     LORazepam 0.5 MG tablet  Commonly known as:  ATIVAN  Take 0.5 mg by mouth 2 (two) times daily as needed for anxiety.     multivitamin with minerals Tabs tablet  Take 1 tablet by mouth daily.     nitroGLYCERIN 0.4 MG SL tablet  Commonly known as:  NITROSTAT  Place 0.4 mg under the tongue every 5 (five) minutes as needed for chest pain.     omeprazole 20 MG capsule  Commonly known as:  PRILOSEC  Take 20 mg by mouth daily.     ondansetron 4 MG disintegrating tablet  Commonly known as:  ZOFRAN ODT  4mg  ODT q4 hours prn nausea/vomit     polyethylene glycol packet  Commonly known as:  MIRALAX / GLYCOLAX  Take 17 g by mouth daily.      polyethylene glycol packet  Commonly known as:  MIRALAX / GLYCOLAX  Take 17 g by mouth 2 (two) times daily.     promethazine 25 MG tablet  Commonly known as:  PHENERGAN  Take 25 mg by mouth every 6 (six) hours as needed for nausea or vomiting.     senna 8.6 MG Tabs tablet  Commonly known as:  SENOKOT  Take 3 tablets by mouth at bedtime.     simvastatin 10 MG tablet  Commonly known as:  ZOCOR  Take 10 mg by mouth daily.     traMADol 50 MG tablet  Commonly known as:  ULTRAM  Take 50 mg by mouth 2 (two) times daily.     traZODone 100 MG  tablet  Commonly known as:  DESYREL  Take 200 mg by mouth at bedtime as needed for sleep.           Follow-up Information    Follow up with London Pepper, MD.   Specialty:  Family Medicine   Contact information:   [address] [place] [address] [phone]       The results of significant diagnostics from this hospitalization (including imaging, microbiology, ancillary and laboratory) are listed below for reference.    Significant Diagnostic Studies: Dg Abd 1 View  08/30/2015  CLINICAL DATA:  81 year old male with failure to thrive and constipation. EXAM: ABDOMEN - 1 VIEW COMPARISON:  08/28/2015 and prior exams FINDINGS: The bowel gas pattern is unremarkable. No dilated bowel loops are present. No suspicious calcifications are identified. Surgical hardware within the proximal left femur and a lower thoracic spine compression fracture and vertebral augmentation changes again noted. IMPRESSION: No acute abnormalities.  Normal bowel gas pattern. Electronically Signed   By: Margarette Canada M.D.   On: 08/30/2015 13:21   Ct Head Wo Contrast  08/30/2015  CLINICAL DATA:  Lethargy. EXAM: CT HEAD WITHOUT CONTRAST TECHNIQUE: Contiguous axial images were obtained from the base of the skull through the vertex without intravenous contrast. COMPARISON:  CT scan of May 12, 2013. FINDINGS: Bony calvarium appears intact. Moderate diffuse  cortical atrophy is noted. Moderate chronic ischemic white matter disease is noted. No mass effect or midline shift is noted. Ventricular size is within normal limits. There is no evidence of mass lesion, hemorrhage or acute infarction. IMPRESSION: Moderate diffuse cortical atrophy. Moderate chronic ischemic white matter disease. No acute intracranial abnormality seen. Electronically Signed   By: Marijo Conception, M.D.   On: 08/30/2015 13:07   Mr Jeri Cos JI Contrast  08/31/2015  CLINICAL DATA:  Confusion following hospitalization for abdominal pain and GI bleed. EXAM: MRI HEAD WITHOUT AND WITH CONTRAST TECHNIQUE: Multiplanar, multiecho pulse sequences of the brain and surrounding structures were obtained without and with intravenous contrast. CONTRAST:  73mL MULTIHANCE GADOBENATE DIMEGLUMINE 529 MG/ML IV SOLN COMPARISON:  CT head without contrast 08/30/2015 FINDINGS: The diffusion-weighted images demonstrate no evidence for acute or subacute infarction. No acute hemorrhage or mass lesion is present. Advanced atrophy is present. Diffuse confluent periventricular and subcortical T2 signal is evident bilaterally. White matter changes extend into the brainstem. No acute hemorrhage or mass lesion is present. The ventricles are of normal size. No significant extra-axial fluid collection is present. Postcontrast images demonstrate no pathologic enhancement. The internal auditory canal is within normal limits. Flow is present in the major intracranial arteries. Bilateral lens replacements are present. The paranasal sinuses and mastoid air cells are clear. The skullbase is within normal limits. Midline structures are unremarkable. IMPRESSION: 1. No acute intracranial abnormality. 2. Advanced atrophy and diffuse white matter disease. This likely reflects the sequela of chronic microvascular ischemia. Electronically Signed   By: San Morelle M.D.   On: 08/31/2015 19:57   Ct Abdomen Pelvis W Contrast  08/28/2015   CLINICAL DATA:  Nausea vomiting and rectal bleeding. EXAM: CT ABDOMEN AND PELVIS WITH CONTRAST TECHNIQUE: Multidetector CT imaging of the abdomen and pelvis was performed using the standard protocol following bolus administration of intravenous contrast. CONTRAST:  147mL OMNIPAQUE IOHEXOL 300 MG/ML  SOLN COMPARISON:  CT 08/24/2015 FINDINGS: Lower chest: Mild increase in RIGHT pleural effusion. Small LEFT effusion stable. Mild basilar atelectasis. Dense calcification of the RIGHT hemidiaphragm Hepatobiliary: No focal hepatic lesion. Gallbladder  is not identified. There is mild intrahepatic and extrahepatic duct dilatation. The common bile duct measures upper limits of normal at 6 mm. Pancreas: Pancreatic atrophy present. No pancreatic mass or duct dilatation. Spleen: Normal spleen Adrenals/urinary tract: Adrenal glands and kidneys are normal. The ureters and bladder normal. Stomach/Bowel: Stomach, small-bowel normal. There is a RIGHT inguinal hernia which contains a long segment of nonobstructed small bowel. This not changed comparison exam. The colon is distended mildly and stool-filled. The rectum has fluid stool and is mildly distended. Vascular/Lymphatic: Abdominal aorta is normal caliber with atherosclerotic calcification. There is no retroperitoneal or periportal lymphadenopathy. No pelvic lymphadenopathy. Reproductive: Prostate is enlarged to 50  mm Musculoskeletal: No aggressive osseous lesion. Other: No free fluid. IMPRESSION: 1. Moderate volume stool throughout the colon which is partially formed and without obstructing lesion. 2. Large RIGHT inguinal hernia contains a long segment of nonobstructed small bowel, not changed. 3. Mild intrahepatic and extrahepatic biliary duct dilatation without obstructing lesion identified. 4. Intrarenal increase in RIGHT pleural effusion which is small to moderate. Electronically Signed   By: Suzy Bouchard M.D.   On: 08/28/2015 11:12   Dg Abd Acute  W/chest  08/28/2015  CLINICAL DATA:  LEFT lower quadrant abdominal pain and constipation EXAM: DG ABDOMEN ACUTE W/ 1V CHEST COMPARISON:  Radiograph 1022 1,016, CT 08/24/2015 FINDINGS: Sternotomy wires overlie normal cardiac silhouette. There are low lung volumes mild basilar scarring and atelectasis. No airspace disease. LEFT lateral decubitus view demonstrates no intraperitoneal free air. No dilated loops of large or small bowel. There is stool in the rectum. No inguinal hernia evident. IMPRESSION: 1. No acute cardiopulmonary findings.  Basilar atelectasis. 2. No bowel obstruction or intraperitoneal free air. Electronically Signed   By: Suzy Bouchard M.D.   On: 08/28/2015 09:54   Ct Renal Stone Study  08/24/2015  CLINICAL DATA:  81 year old male with right side flank pain radiating to the groin for several days. PET-CT EXAM: CT ABDOMEN AND PELVIS WITHOUT CONTRAST TECHNIQUE: Multidetector CT imaging of the abdomen and pelvis was performed following the standard protocol without IV contrast. COMPARISON:  CT Abdomen and Pelvis 02/18/2012. Lumbar spine CT 03/05/2012. FINDINGS: Sequelae of median sternotomy and CABG. No pericardial effusion. New small layering right pleural effusion. Trace left pleural effusion. Chronic pleural calcification along the right hemidiaphragm. Increased dependent pulmonary opacity more so in the left lung. No lung base air bronchograms. Osteopenia. The T11 compression fracture seen in 2013 has now been been augmented. There is a new mild T12 inferior endplate compression fracture, age indeterminate. Mild to moderate T10 compression fracture also new since 2013. Lower lumbar facet degeneration. Interval left proximal femur ORIF. Other visualized osseous structures appear stable. Chronic right inguinal hernia has progressed since 2013 now measuring up to 10 cm diameter. This contains multiple small bowel loops which do not appear incarcerated. Stool ball in the rectum. No pelvic free  fluid. Mild prostate enlargement. Unremarkable urinary bladder. Redundant sigmoid colon with no active inflammation identified. Mild gas and stool distension of the more proximal colon. No pericecal inflammation. Appendix not evident. No dilated small bowel. Decompressed stomach and duodenum. Noncontrast liver, spleen, pancreas, and adrenal glands are stable. The gallbladder is diminutive or absent. Extensive Aortoiliac calcified atherosclerosis noted. No abdominal free fluid or free air. Bilateral renal hilar vascular calcifications. No hydronephrosis or hydroureter. No definite nephrolithiasis. No lymphadenopathy identified. IMPRESSION: 1. Progressed small bowel containing right inguinal hernia since 2013. This does not appear incarcerated currently, and there is no associated bowel  obstruction. 2. Age-indeterminate T11 and T12 compression fractures. 3. New small right pleural effusion. Increased left lower lobe opacity, atelectasis versus infection. 4. Extensive Aortoiliac calcified atherosclerosis. No nephrolithiasis or obstructive uropathy. Electronically Signed   By: Genevie Ann M.D.   On: 08/24/2015 13:49   Labs: Basic Metabolic Panel:  Recent Labs Lab 08/28/15 0809 08/29/15 0354 08/31/15 0500 09/01/15 0556  NA 135 137 140 135  K 4.5 3.6 2.6* 3.0*  CL 99* 101 100* 99*  CO2 29 23 27 24   GLUCOSE 125* 102* 97 106*  BUN 28* 25* 24* 12  CREATININE 0.76 0.69 0.69 0.52*  CALCIUM 8.7* 8.7* 8.5* 8.2*   Liver Function Tests:  Recent Labs Lab 08/28/15 0809  AST 37  ALT 13*  ALKPHOS 47  BILITOT 1.4*  PROT 8.6*  ALBUMIN 2.7*    Recent Labs Lab 08/28/15 0809  LIPASE 21   CBC:  Recent Labs Lab 08/28/15 0809 08/28/15 1300 08/28/15 1852 08/29/15 0354 08/30/15 0532 08/31/15 0500  WBC 4.7 3.9* 3.9* 3.8* 4.1 4.1  NEUTROABS 2.9  --   --   --   --   --   HGB 10.8* 10.6* 10.4* 10.8* 10.7* 11.2*  HCT 31.8* 30.7* 30.9* 32.1* 31.6* 33.5*  MCV 99.7 98.7 99.7 100.0 99.1 100.0  PLT 133*  118* 117* 96* 124* 128*    Signed:  Maryetta Shafer  [facility] 09/01/2015, 9:51 AM

## 2015-09-01 NOTE — Progress Notes (Signed)
PT Cancellation Note  Patient Details Name: Andre Mueller MRN: [id number] DOB: [birth year]   Cancelled Treatment:    Reason Eval/Treat Not Completed: Patient declined, no reason specified (perseverating on someone taking him home)   [facility] 09/01/2015, 11:52 AM [place]

## 2015-09-01 NOTE — Progress Notes (Signed)
09/01/15 Call placed to Social Worker ask about sending patient back to [facility] when able .

## 2015-09-01 NOTE — Care Management Note (Signed)
Case Management Note  Patient Details  Name: Wilkie Zenon MRN: [id number] Date of Birth: 1921-08-04  Subjective/Objective:      Patient is for dc to ALF today, CSW following.              Action/Plan:   Expected Discharge Date:                  Expected Discharge Plan:  Assisted Living / Rest Home  In-House Referral:  Clinical Social Work  Discharge planning Services  CM Consult  Post Acute Care Choice:    Choice offered to:     DME Arranged:    DME Agency:     HH Arranged:    [place] Agency:     Status of Service:  Completed, signed off  Medicare Important Message Given:    Date Medicare IM Given:    Medicare IM give by:    Date Additional Medicare IM Given:    Additional Medicare Important Message give by:     If discussed at [place] of Stay Meetings, dates discussed:    Additional Comments:  Zenon Mayo, RN 09/01/2015, 11:32 AM

## 2015-09-08 ENCOUNTER — Emergency Department (HOSPITAL_COMMUNITY): Payer: Medicare Other

## 2015-09-08 ENCOUNTER — Encounter (HOSPITAL_COMMUNITY): Payer: Self-pay | Admitting: Emergency Medicine

## 2015-09-08 ENCOUNTER — Emergency Department (HOSPITAL_COMMUNITY)
Admission: EM | Admit: 2015-09-08 | Discharge: 2015-09-08 | Disposition: A | Discharge status: Home / Self Care | Payer: Medicare Other | Attending: Emergency Medicine | Admitting: Emergency Medicine

## 2015-09-08 DIAGNOSIS — I2510 Atherosclerotic heart disease of native coronary artery without angina pectoris: Secondary | ICD-10-CM | POA: Insufficient documentation

## 2015-09-08 DIAGNOSIS — Z862 Personal history of diseases of the blood and blood-forming organs and certain disorders involving the immune mechanism: Secondary | ICD-10-CM | POA: Diagnosis not present

## 2015-09-08 DIAGNOSIS — Z87891 Personal history of nicotine dependence: Secondary | ICD-10-CM | POA: Diagnosis not present

## 2015-09-08 DIAGNOSIS — G8929 Other chronic pain: Secondary | ICD-10-CM | POA: Insufficient documentation

## 2015-09-08 DIAGNOSIS — K219 Gastro-esophageal reflux disease without esophagitis: Secondary | ICD-10-CM | POA: Insufficient documentation

## 2015-09-08 DIAGNOSIS — Z951 Presence of aortocoronary bypass graft: Secondary | ICD-10-CM | POA: Diagnosis not present

## 2015-09-08 DIAGNOSIS — M810 Age-related osteoporosis without current pathological fracture: Secondary | ICD-10-CM | POA: Diagnosis not present

## 2015-09-08 DIAGNOSIS — Z7951 Long term (current) use of inhaled steroids: Secondary | ICD-10-CM | POA: Diagnosis not present

## 2015-09-08 DIAGNOSIS — F419 Anxiety disorder, unspecified: Secondary | ICD-10-CM | POA: Insufficient documentation

## 2015-09-08 DIAGNOSIS — Z859 Personal history of malignant neoplasm, unspecified: Secondary | ICD-10-CM | POA: Diagnosis not present

## 2015-09-08 DIAGNOSIS — Z880 Allergy status to penicillin: Secondary | ICD-10-CM | POA: Insufficient documentation

## 2015-09-08 DIAGNOSIS — Z8669 Personal history of other diseases of the nervous system and sense organs: Secondary | ICD-10-CM | POA: Diagnosis not present

## 2015-09-08 DIAGNOSIS — Z7982 Long term (current) use of aspirin: Secondary | ICD-10-CM | POA: Insufficient documentation

## 2015-09-08 DIAGNOSIS — R05 Cough: Secondary | ICD-10-CM | POA: Diagnosis not present

## 2015-09-08 DIAGNOSIS — K4090 Unilateral inguinal hernia, without obstruction or gangrene, not specified as recurrent: Secondary | ICD-10-CM | POA: Diagnosis not present

## 2015-09-08 DIAGNOSIS — M549 Dorsalgia, unspecified: Secondary | ICD-10-CM | POA: Insufficient documentation

## 2015-09-08 DIAGNOSIS — K5900 Constipation, unspecified: Secondary | ICD-10-CM | POA: Insufficient documentation

## 2015-09-08 DIAGNOSIS — Z86018 Personal history of other benign neoplasm: Secondary | ICD-10-CM | POA: Diagnosis not present

## 2015-09-08 DIAGNOSIS — Z79899 Other long term (current) drug therapy: Secondary | ICD-10-CM | POA: Diagnosis not present

## 2015-09-08 DIAGNOSIS — E876 Hypokalemia: Secondary | ICD-10-CM | POA: Insufficient documentation

## 2015-09-08 DIAGNOSIS — R053 Chronic cough: Secondary | ICD-10-CM

## 2015-09-08 DIAGNOSIS — E7800 Pure hypercholesterolemia, unspecified: Secondary | ICD-10-CM | POA: Diagnosis not present

## 2015-09-08 LAB — URINALYSIS, ROUTINE W REFLEX MICROSCOPIC
Glucose, UA: NEGATIVE mg/dL
HGB URINE DIPSTICK: NEGATIVE
KETONES UR: 40 mg/dL — AB
Leukocytes, UA: NEGATIVE
Nitrite: NEGATIVE
PROTEIN: NEGATIVE mg/dL
SPECIFIC GRAVITY, URINE: 1.026 (ref 1.005–1.030)
Urobilinogen, UA: 1 mg/dL (ref 0.0–1.0)
pH: 5.5 (ref 5.0–8.0)

## 2015-09-08 LAB — CBC WITH DIFFERENTIAL/PLATELET
Basophils Absolute: 0 10*3/uL (ref 0.0–0.1)
Basophils Relative: 0 %
EOS ABS: 0 10*3/uL (ref 0.0–0.7)
Eosinophils Relative: 0 %
HCT: 36 % — ABNORMAL LOW (ref 39.0–52.0)
HEMOGLOBIN: 12.4 g/dL — AB (ref 13.0–17.0)
LYMPHS ABS: 1.2 10*3/uL (ref 0.7–4.0)
LYMPHS PCT: 14 %
MCH: 33.8 pg (ref 26.0–34.0)
MCHC: 34.4 g/dL (ref 30.0–36.0)
MCV: 98.1 fL (ref 78.0–100.0)
Monocytes Absolute: 0.6 10*3/uL (ref 0.1–1.0)
Monocytes Relative: 7 %
NEUTROS PCT: 79 %
Neutro Abs: 6.4 10*3/uL (ref 1.7–7.7)
Platelets: 148 10*3/uL — ABNORMAL LOW (ref 150–400)
RBC: 3.67 MIL/uL — AB (ref 4.22–5.81)
RDW: 13.4 % (ref 11.5–15.5)
WBC: 8.1 10*3/uL (ref 4.0–10.5)

## 2015-09-08 LAB — COMPREHENSIVE METABOLIC PANEL
ALK PHOS: 58 U/L (ref 38–126)
ALT: 11 U/L — AB (ref 17–63)
AST: 16 U/L (ref 15–41)
Albumin: 3.1 g/dL — ABNORMAL LOW (ref 3.5–5.0)
Anion gap: 13 (ref 5–15)
BUN: 23 mg/dL — AB (ref 6–20)
CALCIUM: 8.8 mg/dL — AB (ref 8.9–10.3)
CO2: 27 mmol/L (ref 22–32)
CREATININE: 0.63 mg/dL (ref 0.61–1.24)
Chloride: 94 mmol/L — ABNORMAL LOW (ref 101–111)
GFR calc non Af Amer: >60 mL/min (ref >60)
GLUCOSE: 122 mg/dL — AB (ref 65–99)
Potassium: 2.6 mmol/L — CL (ref 3.5–5.1)
SODIUM: 134 mmol/L — AB (ref 135–145)
Total Bilirubin: 1.2 mg/dL (ref 0.3–1.2)
Total Protein: 8.6 g/dL — ABNORMAL HIGH (ref 6.5–8.1)

## 2015-09-08 LAB — I-STAT TROPONIN, ED: TROPONIN I, POC: 0 ng/mL (ref 0.00–0.08)

## 2015-09-08 LAB — LIPASE, BLOOD: Lipase: 24 U/L (ref 11–51)

## 2015-09-08 MED ORDER — POTASSIUM CHLORIDE CRYS ER 20 MEQ PO TBCR
40.0000 meq | EXTENDED_RELEASE_TABLET | Freq: Once | ORAL | Status: AC
  Administered 2015-09-08: 40 meq via ORAL
  Filled 2015-09-08: qty 2

## 2015-09-08 MED ORDER — DOCUSATE SODIUM 100 MG PO CAPS: 100.0000 mg | ORAL_CAPSULE | Freq: Two times a day (BID) | ORAL | Status: AC

## 2015-09-08 MED ORDER — POTASSIUM CHLORIDE 10 MEQ/100ML IV SOLN
10.0000 meq | INTRAVENOUS | Status: AC
  Administered 2015-09-08 (×2): 10 meq via INTRAVENOUS
  Filled 2015-09-08 (×2): qty 100

## 2015-09-08 MED ORDER — ONDANSETRON HCL 4 MG/2ML IJ SOLN
4.0000 mg | Freq: Once | INTRAMUSCULAR | Status: AC
  Administered 2015-09-08: 4 mg via INTRAVENOUS
  Filled 2015-09-08: qty 2

## 2015-09-08 MED ORDER — FENTANYL CITRATE (PF) 100 MCG/2ML IJ SOLN
50.0000 ug | Freq: Once | INTRAMUSCULAR | Status: AC
  Administered 2015-09-08: 50 ug via INTRAVENOUS
  Filled 2015-09-08: qty 2

## 2015-09-08 MED ORDER — HYDROCODONE-ACETAMINOPHEN 5-325 MG PO TABS: 1.0000 | ORAL_TABLET | Freq: Four times a day (QID) | ORAL | Status: AC | PRN

## 2015-09-08 MED ORDER — POTASSIUM CHLORIDE ER 10 MEQ PO TBCR: 10.0000 meq | EXTENDED_RELEASE_TABLET | Freq: Two times a day (BID) | ORAL | Status: AC

## 2015-09-08 NOTE — ED Notes (Signed)
Notified [facility] for transportation 

## 2015-09-08 NOTE — ED Notes (Signed)
EKG given to EDP, Ward,MD., for review. 

## 2015-09-08 NOTE — ED Provider Notes (Signed)
By signing my name below, I, Forrestine Him, attest that this documentation has been prepared under the direction and in the presence of [place], DO.  Electronically Signed: Forrestine Him, ED Scribe. 09/08/2015. 2:50 AM.   TIME SEEN: 2:36 AM   CHIEF COMPLAINT:  Chief Complaint  Patient presents with  . Cough     HPI:  HPI Comments: Andre Mueller brought in by EMS from [facility] is a 81 y.o. male with a PMHx of HTN, inguinal hernia, CAD, and osteoporosis who presents to the Emergency Department complaining of constant, ongoing productive cough and SOB x few years; worsened in last 2 days. No aggravating or alleviating factors at this time. Ongoing shortness of breath also reported x 1 day. No OTC medications, home remedies, or home treatments attempted prior to arrival. No recent fever, chills, chest pain, nausea, vomiting, dysuria, or hematuria. Andre Mueller was evaluated and admitted on 10/30 for R sided lower abdominal pain secondary to a known ongoing hernia. However, pt states discomfort is still persistent this evening. States he has had this hernia for 30 years and has had pain with it for several years as well. States he has been told he is not a surgical candidate. Also complaining of lower back pain that he has had for over one year. States because of his back pain he is now wheelchair bound for the past 8 months. No new numbness or focal weakness. No new injury.  PCP: London Pepper, MD     ROS: See HPI Constitutional: no fever  Eyes: no drainage  ENT: no runny nose   Cardiovascular:  no chest pain  Resp: Positive cough and SOB  GI: no vomiting. Positive abdominal pain GU: no dysuria Integumentary: no rash  Allergy: no hives  Musculoskeletal: no leg swelling. Positive back pain Neurological: no slurred speech ROS otherwise negative  PAST MEDICAL HISTORY/PAST SURGICAL HISTORY:  Past Medical History  Diagnosis Date  . Hypercholesteremia   . Anxiety   . GERD  (gastroesophageal reflux disease)   . Glaucoma   . Esophageal tear 1990's?  . Hypertension   . Constipation   . Cancer ([place])     hx of waldrens disease  . Benign neoplasm of colon 02/2012    polyps x 2 adenomatous w/o high grade dysplasia  . CAD (coronary artery disease) 2007    s/p CABG in [place]  . Diverticulosis of colon (without mention of hemorrhage)     hx LGIB, 04/2012 hosp  . History of resection of small bowel   . Osteoporosis, senile     hx T11 compression fx 02/2012, s/p KP  . Inguinal hernia   . Anemia     MEDICATIONS:  Prior to Admission medications   Medication Sig Start Date End Date Taking? Authorizing Provider  aspirin EC 81 MG tablet Take 81 mg by mouth daily.    Historical Provider, MD  benzonatate (TESSALON) 100 MG capsule Take 100 mg by mouth 3 (three) times daily as needed for cough.  12/16/14   Historical Provider, MD  bisoprolol-hydrochlorothiazide (ZIAC) 5-6.25 MG per tablet Take 1 tablet by mouth daily.    Historical Provider, MD  cholecalciferol (VITAMIN D) 1000 UNITS tablet Take 1,000 Units by mouth daily.    Historical Provider, MD  feeding supplement, ENSURE ENLIVE, (ENSURE ENLIVE) LIQD Take 237 mLs by mouth 2 (two) times daily between meals. 09/01/15   Costin Karlyne Greenspan, MD  fluticasone (FLONASE) 50 MCG/ACT nasal spray Place 1 spray into the nose  daily. allergies 02/04/12   Historical Provider, MD  furosemide (LASIX) 20 MG tablet Take 20 mg by mouth daily as needed for edema.    Historical Provider, MD  guaiFENesin (MUCINEX) 600 MG 12 hr tablet Take 1 tablet (600 mg total) by mouth 2 (two) times daily. Patient taking differently: Take 600 mg by mouth every 12 (twelve) hours as needed for cough or to loosen phlegm.  01/12/15   Alvina Chou, PA-C  HYDROcodone-acetaminophen (NORCO/VICODIN) 5-325 MG tablet Take 1 tablet by mouth every 6 (six) hours as needed for moderate pain. 08/24/15   Milton Ferguson, MD  levothyroxine (SYNTHROID, LEVOTHROID) 25 MCG tablet  Take 25 mcg by mouth daily before breakfast.    Historical Provider, MD  LORazepam (ATIVAN) 0.5 MG tablet Take 0.5 mg by mouth 2 (two) times daily as needed for anxiety.  12/15/14   Historical Provider, MD  Multiple Vitamin (MULITIVITAMIN WITH MINERALS) TABS Take 1 tablet by mouth daily.    Historical Provider, MD  nitroGLYCERIN (NITROSTAT) 0.4 MG SL tablet Place 0.4 mg under the tongue every 5 (five) minutes as needed for chest pain.     Historical Provider, MD  omeprazole (PRILOSEC) 20 MG capsule Take 20 mg by mouth daily.    Historical Provider, MD  ondansetron (ZOFRAN ODT) 4 MG disintegrating tablet 4mg  ODT q4 hours prn nausea/vomit 08/24/15   Milton Ferguson, MD  polyethylene glycol [facility] / GLYCOLAX) packet Take 17 g by mouth daily. 08/29/15   Delfina Redwood, MD  polyethylene glycol (MIRALAX / GLYCOLAX) packet Take 17 g by mouth 2 (two) times daily. 09/01/15   Costin Karlyne Greenspan, MD  promethazine (PHENERGAN) 25 MG tablet Take 25 mg by mouth every 6 (six) hours as needed for nausea or vomiting.    Historical Provider, MD  senna (SENOKOT) 8.6 MG TABS Take 3 tablets by mouth at bedtime.     Historical Provider, MD  simvastatin (ZOCOR) 10 MG tablet Take 10 mg by mouth daily.    Historical Provider, MD  traMADol (ULTRAM) 50 MG tablet Take 50 mg by mouth 2 (two) times daily.     Historical Provider, MD  traZODone (DESYREL) 100 MG tablet Take 200 mg by mouth at bedtime as needed for sleep.  12/15/14   Historical Provider, MD    ALLERGIES:  Allergies  Allergen Reactions  . Penicillins Swelling    SOCIAL HISTORY:  Social History  Substance Use Topics  . Smoking status: Former Smoker    Quit date: 09/19/1962  . 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 Alcohol Use: No    FAMILY  HISTORY: Family History  Problem Relation Age of Onset  . Colon cancer Neg Hx   . Malignant hyperthermia Neg Hx     EXAM: BP 148/78 mmHg  Pulse 82  Temp(Src) 98.3 F (36.8 C) (Oral)  Resp 16  SpO2 95% CONSTITUTIONAL: Alert and oriented and responds appropriately to questions. Chronically ill appearing, elderly HEAD: Normocephalic EYES: Conjunctivae clear, PERRL ENT: normal nose; no rhinorrhea; moist mucous membranes; pharynx without lesions noted NECK: Supple, no meningismus, no LAD  CARD: RRR; S1 and S2 appreciated; no murmurs, no clicks, no rubs, no gallops RESP: Normal chest excursion without splinting or tachypnea;  no wheezes, no rales, no hypoxia  or respiratory distress, speaking full sentences. Increased rhonchorous breath sounds and a wet cough ABD/GI: Normal bowel sounds; non-distended; soft, no rebound, no guarding, no peritoneal signs. R inguinal hernia easily reducible with mild tenderness to palpation. No warmth or erythema overlying the hernia, skin changes, or ecchymosis. GU:  GU:  Normal external genitalia, uncircumcised male, normal penile shaft, no blood or discharge at the urethral meatus, no testicular masses or tenderness on exam, no scrotal masses or swelling, no hernias appreciated, 2+ femoral pulses bilaterally; no perineal erythema, warmth, subcutaneous air or crepitus; no high riding testicle, normal bilateral cremasteric reflex BACK:  The back appears normal and is non-tender to palpation, there is no CVA tenderness EXT: Normal ROM in all joints; non-tender to palpation; no edema; normal capillary refill; no cyanosis, no calf tenderness or swelling    SKIN: Normal color for age and race; warm NEURO: Moves all extremities equally, sensation to light touch intact diffusely, cranial nerves II through XII intact PSYCH: The patient's mood and manner are appropriate. Grooming and personal hygiene are appropriate.  MEDICAL DECISION MAKING: Patient here with multiple  chronic complaints. Has had chronic cough and shortness of breath that he feels like his worsening. Also complaining of worsening pain over the right inguinal hernia that is also been present for over 30 years. Also complaining of back pain for the past year that is uncontrolled. No new pain, neurologic deficits, fever. He has an inguinal hernia on exam that is easily reducible without overlying skin changes. Doubt strangulation, incarceration. Otherwise his abdominal exam is benign he reports feeling better after hernia was reduced with gentle pressure. He does have a wet cough on exam. Will obtain labs, chest x-ray, urine. Will give fentanyl and reassess. He is hemodynamically stable.  ED PROGRESS: [name] potassium is 2.6. Will replace. Otherwise his workup has been unremarkable. Troponin negative. Chest x-ray clear. Urine shows no sign of infection.  Patient reports feeling much better after one dose of IV fentanyl. I feel that after his potassium has been replaced he can be discharged back to his nursing facility and he agrees. We'll discharge with prescription for Vicodin to take as needed for his chronic pain. Recommend close outpatient follow-up. He verbalizes understanding and is comfortable with this plan.    EKG Interpretation  Date/Time:  Thursday September 08 2015 02:56:56 EST Ventricular Rate:  82 PR Interval:  131 QRS Duration: 91 QT Interval:  408 QTC Calculation: 476 R Axis:   4 Text Interpretation:  Ectopic atrial rhythm Anteroseptal infarct, age indeterminate No significant change since last tracing Confirmed by Nakota Ackert,  DO, Benji Poynter ([id number]) on 09/08/2015 3:00:45 AM          I personally performed the services described in this documentation, which was scribed in my presence. The recorded information has been reviewed and is accurate.   [place], DO 09/08/15 [phone]

## 2015-09-08 NOTE — ED Notes (Signed)
Patient transported to X-ray 

## 2015-09-08 NOTE — ED Notes (Signed)
Attempted to call discharge report to [facility] x 2 but no answer. Will give discharge instructions to [facility].

## 2015-09-08 NOTE — Discharge Instructions (Signed)
Labs were normal today other than a potassium of 2.6. We have given you IV and oral potassium. This lab will need to be rechecked by your primary care provider in one week. We are also sending you home on potassium supplements to take twice a day. Your chest x-ray showed no pneumonia, pulmonary edema. Your urine showed no sign of infection. Your pain was well-controlled with one dose of fentanyl. Given your pain is chronic, this will need to be treated by your primary care physician. We're sending you with a prescription for Vicodin to take as needed.   Chronic Pain Chronic pain can be defined as pain that is off and on and lasts for 3-6 months or longer. Many things cause chronic pain, which can make it difficult to make a diagnosis. There are many treatment options available for chronic pain. However, finding a treatment that works well for you may require trying various approaches until the right one is found. Many people benefit from a combination of two or more types of treatment to control their pain. SYMPTOMS  Chronic pain can occur anywhere in the body and can range from mild to very severe. Some types of chronic pain include:  Headache.  Low back pain.  Cancer pain.  Arthritis pain.  Neurogenic pain. This is pain resulting from damage to nerves. People with chronic pain may also have other symptoms such as:  Depression.  Anger.  Insomnia.  Anxiety. DIAGNOSIS  Your health care provider will help diagnose your condition over time. In many cases, the initial focus will be on excluding possible conditions that could be causing the pain. Depending on your symptoms, your health care provider may order tests to diagnose your condition. Some of these tests may include:   Blood tests.   CT scan.   MRI.   X-rays.   Ultrasounds.   Nerve conduction studies.  You may need to see a specialist.  TREATMENT  Finding treatment that works well may take time. You may be referred  to a pain specialist. He or she may prescribe medicine or therapies, such as:   Mindful meditation or yoga.  Shots (injections) of numbing or pain-relieving medicines into the spine or area of pain.  Local electrical stimulation.  Acupuncture.   Massage therapy.   Aroma, color, light, or sound therapy.   Biofeedback.   Working with a physical therapist to keep from getting stiff.   Regular, gentle exercise.   Cognitive or behavioral therapy.   Group support.  Sometimes, surgery may be recommended.  HOME CARE INSTRUCTIONS   Take all medicines as directed by your health care provider.   Lessen stress in your life by relaxing and doing things such as listening to calming music.   Exercise or be active as directed by your health care provider.   Eat a healthy diet and include things such as vegetables, fruits, fish, and lean meats in your diet.   Keep all follow-up appointments with your health care provider.   Attend a support group with others suffering from chronic pain. SEEK MEDICAL CARE IF:   Your pain gets worse.   You develop a new pain that was not there before.   You cannot tolerate medicines given to you by your health care provider.   You have new symptoms since your last visit with your health care provider.  SEEK IMMEDIATE MEDICAL CARE IF:   You feel weak.   You have decreased sensation or numbness.   You lose control  of bowel or bladder function.   Your pain suddenly gets much worse.   You develop shaking.  You develop chills.  You develop confusion.  You develop chest pain.  You develop shortness of breath.  MAKE SURE YOU:  Understand these instructions.  Will watch your condition.  Will get help right away if you are not doing well or get worse.   This information is not intended to replace advice given to you by your health care provider. Make sure you discuss any questions you have with your health care  provider.   Document Released: 07/07/2002 Document Revised: 06/17/2013 Document Reviewed: 04/10/2013 Elsevier Interactive Patient Education 2016 [employer]  Chronic Back Pain  When back pain lasts longer than 3 months, it is called chronic back pain.People with chronic back pain often go through certain periods that are more intense (flare-ups).  CAUSES Chronic back pain can be caused by wear and tear (degeneration) on different structures in your back. These structures include:  The bones of your spine (vertebrae) and the joints surrounding your spinal cord and nerve roots (facets).  The strong, fibrous tissues that connect your vertebrae (ligaments). Degeneration of these structures may result in pressure on your nerves. This can lead to constant pain. HOME CARE INSTRUCTIONS  Avoid bending, heavy lifting, prolonged sitting, and activities which make the problem worse.  Take brief periods of rest throughout the day to reduce your pain. Lying down or standing usually is better than sitting while you are resting.  Take over-the-counter or prescription medicines only as directed by your caregiver. SEEK IMMEDIATE MEDICAL CARE IF:   You have weakness or numbness in one of your legs or feet.  You have trouble controlling your bladder or bowels.  You have nausea, vomiting, abdominal pain, shortness of breath, or fainting.   This information is not intended to replace advice given to you by your health care provider. Make sure you discuss any questions you have with your health care provider.   Document Released: 11/22/2004 Document Revised: 01/07/2012 Document Reviewed: 04/04/2015 Elsevier Interactive Patient Education 2016 [employer]  Cough, Adult Coughing is a reflex that clears your throat and your airways. Coughing helps to heal and protect your lungs. It is normal to cough occasionally, but a cough that happens with other symptoms or lasts a long time may be a sign of a  condition that needs treatment. A cough may last only 2-3 weeks (acute), or it may last longer than 8 weeks (chronic). CAUSES Coughing is commonly caused by:  Breathing in substances that irritate your lungs.  A viral or bacterial respiratory infection.  Allergies.  Asthma.  Postnasal drip.  Smoking.  Acid backing up from the stomach into the esophagus (gastroesophageal reflux).  Certain medicines.  Chronic lung problems, including COPD (or rarely, lung cancer).  Other medical conditions such as heart failure. HOME CARE INSTRUCTIONS  Pay attention to any changes in your symptoms. Take these actions to help with your discomfort:  Take medicines only as told by your health care provider.  If you were prescribed an antibiotic medicine, take it as told by your health care provider. Do not stop taking the antibiotic even if you start to feel better.  Talk with your health care provider before you take a cough suppressant medicine.  Drink enough fluid to keep your urine clear or pale yellow.  If the air is dry, use a cold steam vaporizer or humidifier in your bedroom or your home to help loosen  secretions.  Avoid anything that causes you to cough at work or at home.  If your cough is worse at night, try sleeping in a semi-upright position.  Avoid cigarette smoke. If you smoke, quit smoking. If you need help quitting, ask your health care provider.  Avoid caffeine.  Avoid alcohol.  Rest as needed. SEEK MEDICAL CARE IF:   You have new symptoms.  You cough up pus.  Your cough does not get better after 2-3 weeks, or your cough gets worse.  You cannot control your cough with suppressant medicines and you are losing sleep.  You develop pain that is getting worse or pain that is not controlled with pain medicines.  You have a fever.  You have unexplained weight loss.  You have night sweats. SEEK IMMEDIATE MEDICAL CARE IF:  You cough up blood.  You have  difficulty breathing.  Your heartbeat is very fast.   This information is not intended to replace advice given to you by your health care provider. Make sure you discuss any questions you have with your health care provider.   Document Released: 04/13/2011 Document Revised: 07/06/2015 Document Reviewed: 12/22/2014 Elsevier Interactive Patient Education 2016 [place], Adult A hernia is the bulging of an organ or tissue through a weak spot in the muscles of the abdomen (abdominal wall). Hernias develop most often near the navel or groin. There are many kinds of hernias. Common kinds include:  Femoral hernia. This kind of hernia develops under the groin in the upper thigh area.  Inguinal hernia. This kind of hernia develops in the groin or scrotum.  Umbilical hernia. This kind of hernia develops near the navel.  Hiatal hernia. This kind of hernia causes part of the stomach to be pushed up into the chest.  Incisional hernia. This kind of hernia bulges through a scar from an abdominal surgery. CAUSES This condition may be caused by:  Heavy lifting.  Coughing over a long period of time.  Straining to have a bowel movement.  An incision made during an abdominal surgery.  A birth defect (congenital defect).  Excess weight or obesity.  Smoking.  Poor nutrition.  Cystic fibrosis.  Excess fluid in the abdomen.  Undescended testicles. SYMPTOMS Symptoms of a hernia include:  A lump on the abdomen. This is the first sign of a hernia. The lump may become more obvious with standing, straining, or coughing. It may get bigger over time if it is not treated or if the condition causing it is not treated.  Pain. A hernia is usually painless, but it may become painful over time if treatment is delayed. The pain is usually dull and may get worse with standing or lifting heavy objects. Sometimes a hernia gets tightly squeezed in the weak spot (strangulated) or stuck there  (incarcerated) and causes additional symptoms. These symptoms may include:  Vomiting.  Nausea.  Constipation.  Irritability. DIAGNOSIS A hernia may be diagnosed with:  A physical exam. During the exam your health care provider may ask you to cough or to make a specific movement, because a hernia is usually more visible when you move.  Imaging tests. These can include:  X-rays.  Ultrasound.  CT scan. TREATMENT A hernia that is small and painless may not need to be treated. A hernia that is large or painful may be treated with surgery. Inguinal hernias may be treated with surgery to prevent incarceration or strangulation. Strangulated hernias are always treated with surgery, because lack of blood  to the trapped organ or tissue can cause it to die. Surgery to treat a hernia involves pushing the bulge back into place and repairing the weak part of the abdomen. HOME CARE INSTRUCTIONS  Avoid straining.  Do not lift anything heavier than 10 lb (4.5 kg).  Lift with your leg muscles, not your back muscles. This helps avoid strain.  When coughing, try to cough gently.  Prevent constipation. Constipation leads to straining with bowel movements, which can make a hernia worse or cause a hernia repair to break down. You can prevent constipation by:  Eating a high-fiber diet that includes plenty of fruits and vegetables.  Drinking enough fluids to keep your urine clear or pale yellow. Aim to drink 6-8 glasses of water per day.  Using a stool softener as directed by your health care provider.  Lose weight, if you are overweight.  Do not use any tobacco products, including cigarettes, chewing tobacco, or electronic cigarettes. If you need help quitting, ask your health care provider.  Keep all follow-up visits as directed by your health care provider. This is important. Your health care provider may need to monitor your condition. SEEK MEDICAL CARE IF:  You have swelling, redness, and  pain in the affected area.  Your bowel habits change. SEEK IMMEDIATE MEDICAL CARE IF:  You have a fever.  You have abdominal pain that is getting worse.  You feel nauseous or you vomit.  You cannot push the hernia back in place by gently pressing on it while you are lying down.  The hernia:  Changes in shape or size.  Is stuck outside the abdomen.  Becomes discolored.  Feels hard or tender.   This information is not intended to replace advice given to you by your health care provider. Make sure you discuss any questions you have with your health care provider.   Document Released: 10/15/2005 Document Revised: 11/05/2014 Document Reviewed: 08/25/2014 Elsevier Interactive Patient Education 2016 [employer].  Hypokalemia Hypokalemia means that the amount of potassium in the blood is lower than normal.Potassium is a chemical, called an electrolyte, that helps regulate the amount of fluid in the body. It also stimulates muscle contraction and helps nerves function properly.Most of the body's potassium is inside of cells, and only a very small amount is in the blood. Because the amount in the blood is so small, minor changes can be life-threatening. CAUSES  Antibiotics.  Diarrhea or vomiting.  Using laxatives too much, which can cause diarrhea.  Chronic kidney disease.  Water pills (diuretics).  Eating disorders (bulimia).  Low magnesium level.  Sweating a lot. SIGNS AND SYMPTOMS  Weakness.  Constipation.  Fatigue.  Muscle cramps.  Mental confusion.  Skipped heartbeats or irregular heartbeat (palpitations).  Tingling or numbness. DIAGNOSIS  Your health care provider can diagnose hypokalemia with blood tests. In addition to checking your potassium level, your health care provider may also check other lab tests. TREATMENT Hypokalemia can be treated with potassium supplements taken by mouth or adjustments in your current medicines. If your potassium level  is very low, you may need to get potassium through a vein (IV) and be monitored in the hospital. A diet high in potassium is also helpful. Foods high in potassium are:  Nuts, such as peanuts and pistachios.  Seeds, such as sunflower seeds and pumpkin seeds.  Peas, lentils, and lima beans.  Whole grain and bran cereals and breads.  Fresh fruit and vegetables, such as apricots, avocado, bananas, cantaloupe, kiwi, oranges, tomatoes,  asparagus, and potatoes.  Orange and tomato juices.  Red meats.  Fruit yogurt. HOME CARE INSTRUCTIONS  Take all medicines as prescribed by your health care provider.  Maintain a healthy diet by including nutritious food, such as fruits, vegetables, nuts, whole grains, and lean meats.  If you are taking a laxative, be sure to follow the directions on the label. SEEK MEDICAL CARE IF:  Your weakness gets worse.  You feel your heart pounding or racing.  You are vomiting or having diarrhea.  You are diabetic and having trouble keeping your blood glucose in the normal range. SEEK IMMEDIATE MEDICAL CARE IF:  You have chest pain, shortness of breath, or dizziness.  You are vomiting or having diarrhea for more than 2 days.  You faint. MAKE SURE YOU:   Understand these instructions.  Will watch your condition.  Will get help right away if you are not doing well or get worse.   This information is not intended to replace advice given to you by your health care provider. Make sure you discuss any questions you have with your health care provider.   Document Released: 10/15/2005 Document Revised: 11/05/2014 Document Reviewed: 04/17/2013 Elsevier Interactive Patient Education [employer].

## 2015-09-08 NOTE — ED Notes (Signed)
[facility] is here for patient to be transported back to facility.

## 2015-09-08 NOTE — ED Notes (Signed)
Per EMS-recently arrived to [facility]. C/o non-productive cough x2 days. Hx back pain so is unable to have productive cough. SpO2 96% on RA. Pt's neurological status is at baseline. VSS.

## 2015-09-08 NOTE — ED Notes (Signed)
Bed: [id number] Expected date:  Expected time:  Means of arrival:  Comments: EMS from [facility]/cough

## 2015-12-28 DEATH — deceased
